# Patient Record
Sex: Female | Born: 1950
Health system: Southern US, Community
[De-identification: ages and names within clinical notes are randomized; demographics above are authoritative.]

## PROBLEM LIST (undated history)

## (undated) DIAGNOSIS — Z87442 Personal history of urinary calculi: Secondary | ICD-10-CM

## (undated) DIAGNOSIS — K219 Gastro-esophageal reflux disease without esophagitis: Secondary | ICD-10-CM

## (undated) DIAGNOSIS — G4730 Sleep apnea, unspecified: Secondary | ICD-10-CM

## (undated) DIAGNOSIS — I2510 Atherosclerotic heart disease of native coronary artery without angina pectoris: Secondary | ICD-10-CM

## (undated) DIAGNOSIS — Z972 Presence of dental prosthetic device (complete) (partial): Secondary | ICD-10-CM

## (undated) DIAGNOSIS — H269 Unspecified cataract: Secondary | ICD-10-CM

## (undated) DIAGNOSIS — T7840XA Allergy, unspecified, initial encounter: Secondary | ICD-10-CM

## (undated) DIAGNOSIS — I10 Essential (primary) hypertension: Secondary | ICD-10-CM

## (undated) DIAGNOSIS — F419 Anxiety disorder, unspecified: Secondary | ICD-10-CM

## (undated) DIAGNOSIS — I700 Atherosclerosis of aorta: Secondary | ICD-10-CM

## (undated) DIAGNOSIS — E039 Hypothyroidism, unspecified: Secondary | ICD-10-CM

## (undated) DIAGNOSIS — L409 Psoriasis, unspecified: Secondary | ICD-10-CM

## (undated) DIAGNOSIS — M1990 Unspecified osteoarthritis, unspecified site: Secondary | ICD-10-CM

## (undated) HISTORY — DX: Unspecified cataract: H26.9

## (undated) HISTORY — PX: EYE SURGERY: SHX253

## (undated) HISTORY — PX: CHOLECYSTECTOMY: SHX55

## (undated) HISTORY — DX: Anxiety disorder, unspecified: F41.9

## (undated) HISTORY — PX: CATARACT EXTRACTION: SUR2

## (undated) HISTORY — PX: TUBAL LIGATION: SHX77

## (undated) HISTORY — DX: Sleep apnea, unspecified: G47.30

## (undated) HISTORY — PX: BREAST SURGERY: SHX581

## (undated) HISTORY — DX: Unspecified osteoarthritis, unspecified site: M19.90

## (undated) HISTORY — DX: Hypothyroidism, unspecified: E03.9

## (undated) HISTORY — DX: Essential (primary) hypertension: I10

## (undated) HISTORY — DX: Psoriasis, unspecified: L40.9

## (undated) HISTORY — DX: Allergy, unspecified, initial encounter: T78.40XA

## (undated) HISTORY — DX: Gastro-esophageal reflux disease without esophagitis: K21.9

---

## 1974-09-18 HISTORY — PX: THYROIDECTOMY: SHX17

## 1979-09-19 HISTORY — PX: BREAST EXCISIONAL BIOPSY: SUR124

## 1997-09-18 HISTORY — PX: ABDOMINAL HYSTERECTOMY: SHX81

## 2001-01-16 LAB — HM COLONOSCOPY: HM Colonoscopy: NORMAL

## 2005-07-25 ENCOUNTER — Ambulatory Visit: Payer: Self-pay | Admitting: Internal Medicine

## 2006-09-04 ENCOUNTER — Ambulatory Visit: Payer: Self-pay | Admitting: Internal Medicine

## 2006-09-18 LAB — HM PAP SMEAR: HM Pap smear: NORMAL

## 2007-10-07 ENCOUNTER — Ambulatory Visit: Payer: Self-pay

## 2008-11-25 ENCOUNTER — Ambulatory Visit: Payer: Self-pay | Admitting: Family Medicine

## 2009-11-29 ENCOUNTER — Ambulatory Visit: Payer: Self-pay | Admitting: Family Medicine

## 2010-12-13 ENCOUNTER — Ambulatory Visit: Payer: Self-pay | Admitting: Family Medicine

## 2011-07-03 ENCOUNTER — Encounter: Payer: Self-pay | Admitting: Internal Medicine

## 2011-07-03 ENCOUNTER — Ambulatory Visit (INDEPENDENT_AMBULATORY_CARE_PROVIDER_SITE_OTHER): Payer: PRIVATE HEALTH INSURANCE | Admitting: Internal Medicine

## 2011-07-03 VITALS — BP 116/70 | HR 63 | Temp 98.5°F | Resp 14 | Ht 63.5 in | Wt 192.8 lb

## 2011-07-03 DIAGNOSIS — F329 Major depressive disorder, single episode, unspecified: Secondary | ICD-10-CM | POA: Insufficient documentation

## 2011-07-03 DIAGNOSIS — I10 Essential (primary) hypertension: Secondary | ICD-10-CM | POA: Insufficient documentation

## 2011-07-03 DIAGNOSIS — E039 Hypothyroidism, unspecified: Secondary | ICD-10-CM

## 2011-07-03 MED ORDER — SERTRALINE HCL 50 MG PO TABS: 50.0000 mg | ORAL_TABLET | Freq: Every day | ORAL | Status: DC

## 2011-07-03 NOTE — Patient Instructions (Signed)
[email or web address]  

## 2011-07-03 NOTE — Progress Notes (Signed)
Subjective:    Patient ID: Jennifer Patrick, female    DOB: 1951-06-17, 60 y.o.   MRN: [id number]  HPI Jennifer Patrick is a 60 year old female who presents to establish care. Notably, she is caring for her husband who is under hospice care for pancreatic cancer. She reports some recent increase in depression surrounding this. She reports good support from her friends and family members. She feels that her current dose of sertraline is adequate. She denies any physical complaints today. She reports full compliance with her medications.  Outpatient Encounter Prescriptions as of 07/03/2011  Medication Sig Dispense Refill  . atenolol (TENORMIN) 50 MG tablet Take 50 mg by mouth daily.        . calcipotriene-betamethasone (TACLONEX) ointment Apply 1 application topically daily.        Marland Kitchen etanercept (ENBREL) 50 MG/ML injection Inject 50 mg into the skin once a week.        . levothyroxine (SYNTHROID, LEVOTHROID) 100 MCG tablet Take 100 mcg by mouth daily.        Marland Kitchen omeprazole (PRILOSEC) 40 MG capsule Take 40 mg by mouth daily.        . sertraline (ZOLOFT) 50 MG tablet Take 1 tablet (50 mg total) by mouth daily.  90 tablet  4  . triamterene-hydrochlorothiazide (MAXZIDE-25) 37.5-25 MG per tablet Take 1 tablet by mouth daily.           Review of Systems  Constitutional: Negative for fever, chills, appetite change, fatigue and unexpected weight change.  HENT: Negative for ear pain, congestion, sore throat, trouble swallowing, neck pain, voice change and sinus pressure.   Eyes: Negative for visual disturbance.  Respiratory: Negative for cough, shortness of breath, wheezing and stridor.   Cardiovascular: Negative for chest pain, palpitations and leg swelling.  Gastrointestinal: Negative for nausea, vomiting, abdominal pain, diarrhea, constipation, blood in stool, abdominal distention and anal bleeding.  Genitourinary: Negative for dysuria and flank pain.  Musculoskeletal: Negative for myalgias,  arthralgias and gait problem.  Skin: Negative for color change and rash.  Neurological: Negative for dizziness and headaches.  Hematological: Negative for adenopathy. Does not bruise/bleed easily.  Psychiatric/Behavioral: Positive for dysphoric mood. Negative for suicidal ideas and sleep disturbance. The patient is not nervous/anxious.    BP 116/70  Pulse 63  Temp(Src) 98.5 F (36.9 C) (Oral)  Resp 14  Ht 5' 3.5" (1.613 m)  Wt 192 lb 12 oz (87.431 kg)  BMI 33.61 kg/m2  SpO2 98%     Objective:   Physical Exam  Constitutional: She is oriented to person, place, and time. She appears well-developed and well-nourished. No distress.  HENT:  Head: Normocephalic and atraumatic.  Right Ear: External ear normal.  Left Ear: External ear normal.  Nose: Nose normal.  Mouth/Throat: Oropharynx is clear and moist. No oropharyngeal exudate.  Eyes: Conjunctivae are normal. Pupils are equal, round, and reactive to light. Right eye exhibits no discharge. Left eye exhibits no discharge. No scleral icterus.  Neck: Normal range of motion. Neck supple. No tracheal deviation present. No thyromegaly present.  Cardiovascular: Normal rate, regular rhythm, normal heart sounds and intact distal pulses.  Exam reveals no gallop and no friction rub.   No murmur heard. Pulmonary/Chest: Effort normal and breath sounds normal. No respiratory distress. She has no wheezes. She has no rales. She exhibits no tenderness.  Musculoskeletal: Normal range of motion. She exhibits no edema and no tenderness.  Lymphadenopathy:    She has no cervical adenopathy.  Neurological: She is  alert and oriented to person, place, and time. No cranial nerve deficit. She exhibits normal muscle tone. Coordination normal.  Skin: Skin is warm and dry. No rash noted. She is not diaphoretic. No erythema. No pallor.  Psychiatric: Her behavior is normal. Judgment and thought content normal. Cognition and memory are normal. She exhibits a depressed  mood.          Assessment & Plan:  1. Depression/Bereavement -offered support today. We'll continue sertraline. She will call or return to clinic should she have any concerns.  2. Hypertension - blood pressures well-controlled today. We will check renal function with labs. She will return to clinic for her physical exam in March 2013.  3. Hypothyroidism -will check TSH with labs. We'll continue Synthroid. She will return to clinic in March of 2013.

## 2011-12-01 ENCOUNTER — Ambulatory Visit (INDEPENDENT_AMBULATORY_CARE_PROVIDER_SITE_OTHER): Payer: PRIVATE HEALTH INSURANCE | Admitting: Internal Medicine

## 2011-12-01 ENCOUNTER — Encounter: Payer: Self-pay | Admitting: Internal Medicine

## 2011-12-01 VITALS — BP 126/70 | HR 77 | Temp 98.1°F | Ht 63.0 in | Wt 196.0 lb

## 2011-12-01 DIAGNOSIS — F3289 Other specified depressive episodes: Secondary | ICD-10-CM

## 2011-12-01 DIAGNOSIS — Z1211 Encounter for screening for malignant neoplasm of colon: Secondary | ICD-10-CM

## 2011-12-01 DIAGNOSIS — F32A Depression, unspecified: Secondary | ICD-10-CM

## 2011-12-01 DIAGNOSIS — F329 Major depressive disorder, single episode, unspecified: Secondary | ICD-10-CM

## 2011-12-01 DIAGNOSIS — I10 Essential (primary) hypertension: Secondary | ICD-10-CM

## 2011-12-01 DIAGNOSIS — R229 Localized swelling, mass and lump, unspecified: Secondary | ICD-10-CM

## 2011-12-01 DIAGNOSIS — M7989 Other specified soft tissue disorders: Secondary | ICD-10-CM | POA: Insufficient documentation

## 2011-12-01 DIAGNOSIS — E039 Hypothyroidism, unspecified: Secondary | ICD-10-CM

## 2011-12-01 NOTE — Assessment & Plan Note (Addendum)
Blood pressure well-controlled today. Recent renal function normal. Will continue atenolol and triamterene hydrochlorothiazide. Followup in 6 months.

## 2011-12-01 NOTE — Assessment & Plan Note (Signed)
Recent thyroid function test on labs performed at her work are normal. We'll continue Synthroid. Followup in 6 months.

## 2011-12-01 NOTE — Assessment & Plan Note (Signed)
Symptoms well controlled with sertraline, especially given patient recently lost her husband. Offered support today. Encouraged her to followup with hospice counseling. Followup in 6 months year or sooner if needed.

## 2011-12-01 NOTE — Progress Notes (Signed)
Subjective:    Patient ID: Jennifer Patrick, female    DOB: 05-19-1951, 61 y.o.   MRN: [id number]  HPI 61 year old female with history of hypertension and hypothyroidism presents for followup. She notes that her husband passed away in 2011-09-15. She reports that she has been dealing relatively well with this loss. She notes she has strong support from friends and family. Her work has been very supportive. She continues to take sertraline and feels that this helps her with coping. She has not sought counseling through hospice.  In regards to her hypertension and hypothyroidism, she reports full compliance with her medications. She notes that she is trying to increase her physical activity and improve her diet. She denies any chest pain, shortness of breath, or headache.   She is also concerned today about several month history of a palpable nodule below her left ear. She reports that this has been unchanged and she first noticed it. It is not painful. She is not certain that it was there prior to several months ago. She denies any pain in her ear or change in hearing. She does not have fever or chills.  Outpatient Encounter Prescriptions as of 12/01/2011  Medication Sig Dispense Refill  . atenolol (TENORMIN) 50 MG tablet Take 50 mg by mouth daily.        . calcipotriene-betamethasone (TACLONEX) ointment Apply 1 application topically daily.        Marland Kitchen etanercept (ENBREL) 50 MG/ML injection Inject 50 mg into the skin once a week.        . levothyroxine (SYNTHROID, LEVOTHROID) 100 MCG tablet Take 100 mcg by mouth daily.        Marland Kitchen omeprazole (PRILOSEC) 40 MG capsule Take 40 mg by mouth daily.        . sertraline (ZOLOFT) 50 MG tablet Take 1 tablet (50 mg total) by mouth daily.  90 tablet  4  . triamterene-hydrochlorothiazide (MAXZIDE-25) 37.5-25 MG per tablet Take 1 tablet by mouth daily.         BP 126/70  Pulse 77  Temp(Src) 98.1 F (36.7 C) (Oral)  Ht 5\' 3"  (1.6 m)  Wt 196 lb (88.905 kg)   BMI 34.72 kg/m2  SpO2 98%  Review of Systems  Constitutional: Negative for fever, chills, appetite change, fatigue and unexpected weight change.  HENT: Negative for ear pain, congestion, sore throat, trouble swallowing, neck pain, voice change and sinus pressure.   Eyes: Negative for visual disturbance.  Respiratory: Negative for cough, shortness of breath, wheezing and stridor.   Cardiovascular: Negative for chest pain, palpitations and leg swelling.  Gastrointestinal: Negative for nausea, vomiting, abdominal pain, diarrhea, constipation, blood in stool, abdominal distention and anal bleeding.  Genitourinary: Negative for dysuria and flank pain.  Musculoskeletal: Negative for myalgias, arthralgias and gait problem.  Skin: Negative for color change and rash.  Neurological: Negative for dizziness and headaches.  Hematological: Negative for adenopathy. Does not bruise/bleed easily.  Psychiatric/Behavioral: Positive for dysphoric mood. Negative for suicidal ideas and sleep disturbance. The patient is not nervous/anxious.        Objective:   Physical Exam  Constitutional: She is oriented to person, place, and time. She appears well-developed and well-nourished. No distress.  HENT:  Head: Normocephalic and atraumatic.    Right Ear: External ear normal.  Left Ear: External ear normal.  Nose: Nose normal.  Mouth/Throat: Oropharynx is clear and moist. No oropharyngeal exudate.  Eyes: Conjunctivae are normal. Pupils are equal, round, and reactive to light. Right  eye exhibits no discharge. Left eye exhibits no discharge. No scleral icterus.  Neck: Normal range of motion. Neck supple. No tracheal deviation present. No thyromegaly present.  Cardiovascular: Normal rate, regular rhythm, normal heart sounds and intact distal pulses.  Exam reveals no gallop and no friction rub.   No murmur heard. Pulmonary/Chest: Effort normal and breath sounds normal. No respiratory distress. She has no wheezes.  She has no rales. She exhibits no tenderness.  Abdominal: Soft. Bowel sounds are normal. She exhibits no distension and no mass. There is no tenderness. There is no rebound and no guarding.  Musculoskeletal: Normal range of motion. She exhibits no edema and no tenderness.  Lymphadenopathy:    She has no cervical adenopathy.  Neurological: She is alert and oriented to person, place, and time. No cranial nerve deficit. She exhibits normal muscle tone. Coordination normal.  Skin: Skin is warm and dry. No rash noted. She is not diaphoretic. No erythema. No pallor.  Psychiatric: She has a normal mood and affect. Her behavior is normal. Judgment and thought content normal.          Assessment & Plan:

## 2011-12-01 NOTE — Assessment & Plan Note (Signed)
Small nodule noted inferior to left ear. It is difficult to tell whether this is part of her mandible. Given that it has not changed and has been present for several months to one year we'll continue to monitor. We discussed potential referral to ENT for further evaluation, but she would prefer to monitor for now.

## 2011-12-28 ENCOUNTER — Ambulatory Visit: Payer: Self-pay | Admitting: Family Medicine

## 2012-02-28 ENCOUNTER — Telehealth: Payer: Self-pay | Admitting: *Deleted

## 2012-02-28 MED ORDER — LEVOTHYROXINE SODIUM 100 MCG PO TABS: 100.0000 ug | ORAL_TABLET | Freq: Every day | ORAL | Status: DC

## 2012-02-28 NOTE — Telephone Encounter (Signed)
She can change to Synthroid daily. Generic is fine. However she will need to have TSH rechecked 1 month after change is made.

## 2012-02-28 NOTE — Telephone Encounter (Signed)
Received a fax from pharmacy stating that Levoxyl has been discontinued.  They want you to switch patient to brand name Synthroid or the generic.  Please advise.

## 2012-02-28 NOTE — Telephone Encounter (Signed)
Rx sent to [facility] pharmacy, patient advised as instructed via telephone.  She stated that she will have the nurse practitioner at her job do her TSH and fax the results to [place] in one month.

## 2012-03-29 ENCOUNTER — Other Ambulatory Visit: Payer: Self-pay | Admitting: *Deleted

## 2012-03-29 MED ORDER — LEVOTHYROXINE SODIUM 100 MCG PO TABS: 100.0000 ug | ORAL_TABLET | Freq: Every day | ORAL | Status: DC

## 2012-06-04 ENCOUNTER — Ambulatory Visit (INDEPENDENT_AMBULATORY_CARE_PROVIDER_SITE_OTHER): Payer: PRIVATE HEALTH INSURANCE | Admitting: Internal Medicine

## 2012-06-04 ENCOUNTER — Encounter: Payer: Self-pay | Admitting: Internal Medicine

## 2012-06-04 VITALS — BP 114/62 | HR 72 | Temp 98.4°F | Resp 16 | Wt 199.5 lb

## 2012-06-04 DIAGNOSIS — R229 Localized swelling, mass and lump, unspecified: Secondary | ICD-10-CM

## 2012-06-04 DIAGNOSIS — F329 Major depressive disorder, single episode, unspecified: Secondary | ICD-10-CM

## 2012-06-04 DIAGNOSIS — F32A Depression, unspecified: Secondary | ICD-10-CM

## 2012-06-04 DIAGNOSIS — H5461 Unqualified visual loss, right eye, normal vision left eye: Secondary | ICD-10-CM | POA: Insufficient documentation

## 2012-06-04 DIAGNOSIS — F3289 Other specified depressive episodes: Secondary | ICD-10-CM

## 2012-06-04 DIAGNOSIS — M7989 Other specified soft tissue disorders: Secondary | ICD-10-CM

## 2012-06-04 DIAGNOSIS — H5460 Unqualified visual loss, one eye, unspecified: Secondary | ICD-10-CM

## 2012-06-04 DIAGNOSIS — I10 Essential (primary) hypertension: Secondary | ICD-10-CM

## 2012-06-04 DIAGNOSIS — E039 Hypothyroidism, unspecified: Secondary | ICD-10-CM

## 2012-06-04 NOTE — Assessment & Plan Note (Signed)
Recent thyroid testing normal. Will continue Synthroid 100 mcg daily. Followup 6 months.

## 2012-06-04 NOTE — Assessment & Plan Note (Signed)
Nodule of soft tissue posterior to left mandible continues to be stable in size. Again discussed potential referral to ENT for biopsy but patient would like to continue to monitor for now. Follow up 6 months.

## 2012-06-04 NOTE — Assessment & Plan Note (Signed)
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 Will request notes on this.

## 2012-06-04 NOTE — Assessment & Plan Note (Signed)
Blood pressure well-controlled on current medications. Will have patient have renal function check with employer labs. Followup 6 months.

## 2012-06-04 NOTE — Progress Notes (Signed)
Subjective:    Patient ID: Jennifer Patrick, female    DOB: Jan 29, 1951, 61 y.o.   MRN: [id number]  HPI 61 year old female with history of hypertension, hypothyroidism, and depression presents for followup. She reports she is generally been doing well. In regards to depression, she continues to decrease her loss of her husband last year. However, she has been active and spending time with friends and family members and feels that she is doing well. She continues to work full-time. She reports full compliance with all her medications. She denies any recent chest pain, headache, or other concerns.  She notes that over the last few months she has had some blurred vision in her right eye. 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 She is scheduled to see a retinal specialist.  Outpatient Encounter Prescriptions as of 06/04/2012  Medication Sig Dispense Refill  . atenolol (TENORMIN) 50 MG tablet Take 50 mg by mouth daily.        . calcipotriene-betamethasone (TACLONEX) ointment Apply 1 application topically daily.        Marland Kitchen etanercept (ENBREL) 50 MG/ML injection Inject 50 mg into the skin once a week.        . levothyroxine (SYNTHROID, LEVOTHROID) 100 MCG tablet Take 1 tablet (100 mcg total) by mouth daily.  90 tablet  0  . omeprazole (PRILOSEC) 40 MG capsule Take 40 mg by mouth daily.        . sertraline (ZOLOFT) 50 MG tablet Take 1 tablet (50 mg total) by mouth daily.  90 tablet  4  . triamterene-hydrochlorothiazide (MAXZIDE-25) 37.5-25 MG per tablet Take 1 tablet by mouth daily.         BP 114/62  Pulse 72  Temp 98.4 F (36.9 C) (Oral)  Resp 16  Wt 199 lb 8 oz (90.493 kg)  SpO2 96%  Review of Systems  Constitutional: Negative for fever, chills, appetite change, fatigue and unexpected weight change.  HENT: Negative for ear pain, congestion, sore throat, trouble swallowing, neck pain, voice change and sinus pressure.   Eyes:  Positive for visual disturbance.  Respiratory: Negative for cough, shortness of breath, wheezing and stridor.   Cardiovascular: Negative for chest pain, palpitations and leg swelling.  Gastrointestinal: Negative for nausea, vomiting, abdominal pain, diarrhea, constipation, blood in stool, abdominal distention and anal bleeding.  Genitourinary: Negative for dysuria and flank pain.  Musculoskeletal: Negative for myalgias, arthralgias and gait problem.  Skin: Negative for color change and rash.  Neurological: Negative for dizziness and headaches.  Hematological: Negative for adenopathy. Does not bruise/bleed easily.  Psychiatric/Behavioral: Negative for suicidal ideas, disturbed wake/sleep cycle and dysphoric mood. The patient is not nervous/anxious.        Objective:   Physical Exam  Constitutional: She is oriented to person, place, and time. She appears well-developed and well-nourished. No distress.  HENT:  Head: Normocephalic and atraumatic.  Right Ear: External ear normal.  Left Ear: External ear normal.  Nose: Nose normal.  Mouth/Throat: Oropharynx is clear and moist. No oropharyngeal exudate.  Eyes: Conjunctivae normal are normal. Pupils are equal, round, and reactive to light. Right eye exhibits no discharge. Left eye exhibits no discharge. No scleral icterus.  Neck: Normal range of motion. Neck supple. No tracheal deviation present. No thyromegaly present.  Cardiovascular: Normal rate, regular rhythm, normal heart sounds and intact distal pulses.  Exam reveals no gallop and no friction rub.   No murmur heard. Pulmonary/Chest: Effort normal and  breath sounds normal. No respiratory distress. She has no wheezes. She has no rales. She exhibits no tenderness.  Musculoskeletal: Normal range of motion. She exhibits no edema and no tenderness.  Lymphadenopathy:    She has no cervical adenopathy.  Neurological: She is alert and oriented to person, place, and time. No cranial nerve deficit.  She exhibits normal muscle tone. Coordination normal.  Skin: Skin is warm and dry. No rash noted. She is not diaphoretic. No erythema. No pallor.  Psychiatric: She has a normal mood and affect. Her behavior is normal. Judgment and thought content normal.          Assessment & Plan:

## 2012-06-04 NOTE — Assessment & Plan Note (Signed)
Patient continues to do well and symptoms are well controlled with Zoloft. Will continue. Follow up 6 months.

## 2012-07-09 ENCOUNTER — Other Ambulatory Visit: Payer: Self-pay | Admitting: Internal Medicine

## 2012-08-07 ENCOUNTER — Other Ambulatory Visit: Payer: Self-pay | Admitting: Internal Medicine

## 2012-09-18 HISTORY — PX: VITRECTOMY: SHX106

## 2012-11-02 ENCOUNTER — Other Ambulatory Visit: Payer: Self-pay

## 2012-11-27 ENCOUNTER — Telehealth: Payer: Self-pay | Admitting: Internal Medicine

## 2012-11-27 NOTE — Telephone Encounter (Signed)
Labs received from [facility] 11/25/2012 show normal kidney and liver function. Cholesterol is slightly elevated. Thyroid function is slightly low. Vitamin D is slightly low. Blood sugars are slightly elevated. We can discuss all of this at your physical exam later this month.

## 2012-12-05 ENCOUNTER — Ambulatory Visit (INDEPENDENT_AMBULATORY_CARE_PROVIDER_SITE_OTHER): Payer: BC Managed Care – PPO | Admitting: Internal Medicine

## 2012-12-05 ENCOUNTER — Encounter: Payer: Self-pay | Admitting: Internal Medicine

## 2012-12-05 VITALS — BP 110/70 | HR 67 | Temp 98.4°F | Ht 63.0 in | Wt 201.0 lb

## 2012-12-05 DIAGNOSIS — Z0000 Encounter for general adult medical examination without abnormal findings: Secondary | ICD-10-CM

## 2012-12-05 DIAGNOSIS — L409 Psoriasis, unspecified: Secondary | ICD-10-CM

## 2012-12-05 DIAGNOSIS — E039 Hypothyroidism, unspecified: Secondary | ICD-10-CM

## 2012-12-05 DIAGNOSIS — I10 Essential (primary) hypertension: Secondary | ICD-10-CM

## 2012-12-05 DIAGNOSIS — F411 Generalized anxiety disorder: Secondary | ICD-10-CM | POA: Insufficient documentation

## 2012-12-05 LAB — HM MAMMOGRAPHY

## 2012-12-05 MED ORDER — LEVOTHYROXINE SODIUM 125 MCG PO TABS: 125.0000 ug | ORAL_TABLET | Freq: Every day | ORAL | Status: DC

## 2012-12-05 NOTE — Progress Notes (Signed)
Subjective:    Patient ID: Jennifer Patrick, female    DOB: [birth year], 62 y.o.   MRN: [id number]  HPI 62 year old female with history of hypertension, hypothyroidism and psoriasis presents for annual exam. She reports recent increase in anxiety and stress at work. She is trying to retire in work a limited schedule prior to retirement. She reports recent episode of panic attack in which she felt overwhelmed and was irrational. She continues to take Zoloft.  In regards to her hypothyroidism, she recently had labs performed which showed TSH was elevated. She notes some fatigue but denies any hot or cold intolerance or constipation.   Regards to psoriasis, she reports symptoms have recently worsened. Particularly over her legs and right upper thigh. She reports no improvement with use of Enbrel. She would like to get a second opinion on her care from another dermatologist.  Outpatient Encounter Prescriptions as of 12/05/2012  Medication Sig Dispense Refill  . atenolol (TENORMIN) 50 MG tablet Take 50 mg by mouth daily.        . calcipotriene-betamethasone (TACLONEX) ointment Apply 1 application topically daily.        . cholecalciferol (VITAMIN D) 1000 UNITS tablet Take 1,000 Units by mouth daily.      Marland Kitchen desoximetasone (TOPICORT) 0.05 % cream Apply 1 application topically 2 (two) times daily.      Marland Kitchen etanercept (ENBREL) 50 MG/ML injection Inject 50 mg into the skin once a week.        . Multiple Vitamins-Minerals (VISION VITAMINS PO) Take by mouth.      Marland Kitchen omeprazole (PRILOSEC) 40 MG capsule Take 40 mg by mouth daily.        . sertraline (ZOLOFT) 50 MG tablet TAKE 1 TABLET EVERY DAY  90 tablet  3  . triamterene-hydrochlorothiazide (MAXZIDE-25) 37.5-25 MG per tablet Take 1 tablet by mouth daily.        . [DISCONTINUED] levothyroxine (SYNTHROID, LEVOTHROID) 100 MCG tablet TAKE 1 TABLET BY MOUTH DAILY.  90 tablet  1  . levothyroxine (SYNTHROID, LEVOTHROID) 125 MCG tablet Take 1 tablet (125 mcg total)  by mouth daily.  90 tablet  3   No facility-administered encounter medications on file as of 12/05/2012.   BP 110/70  Pulse 67  Temp(Src) 98.4 F (36.9 C) (Oral)  Ht 5\' 3"  (1.6 m)  Wt 201 lb (91.173 kg)  BMI 35.61 kg/m2  SpO2 96%  Review of Systems  Constitutional: Negative for fever, chills, appetite change, fatigue and unexpected weight change.  HENT: Negative for ear pain, congestion, sore throat, trouble swallowing, neck pain, voice change and sinus pressure.   Eyes: Negative for visual disturbance.  Respiratory: Negative for cough, shortness of breath, wheezing and stridor.   Cardiovascular: Negative for chest pain, palpitations and leg swelling.  Gastrointestinal: Negative for nausea, vomiting, abdominal pain, diarrhea, constipation, blood in stool, abdominal distention and anal bleeding.  Genitourinary: Negative for dysuria and flank pain.  Musculoskeletal: Negative for myalgias, arthralgias and gait problem.  Skin: Positive for rash. Negative for color change.  Neurological: Negative for dizziness and headaches.  Hematological: Negative for adenopathy. Does not bruise/bleed easily.  Psychiatric/Behavioral: Negative for suicidal ideas, sleep disturbance and dysphoric mood. The patient is nervous/anxious.        Objective:   Physical Exam  Constitutional: She is oriented to person, place, and time. She appears well-developed and well-nourished. No distress.  HENT:  Head: Normocephalic and atraumatic.  Right Ear: External ear normal.  Left Ear: External ear normal.  Nose: Nose normal.  Mouth/Throat: Oropharynx is clear and moist. No oropharyngeal exudate.  Eyes: Conjunctivae are normal. Pupils are equal, round, and reactive to light. Right eye exhibits no discharge. Left eye exhibits no discharge. No scleral icterus.  Neck: Normal range of motion. Neck supple. No tracheal deviation present. No thyromegaly present.  Cardiovascular: Normal rate, regular rhythm, normal heart  sounds and intact distal pulses.  Exam reveals no gallop and no friction rub.   No murmur heard. Pulmonary/Chest: Effort normal and breath sounds normal. No accessory muscle usage. Not tachypneic. No respiratory distress. She has no decreased breath sounds. She has no wheezes. She has no rhonchi. She has no rales. She exhibits no tenderness. Right breast exhibits no inverted nipple, no mass, no nipple discharge, no skin change and no tenderness. Left breast exhibits no inverted nipple, no mass, no nipple discharge, no skin change and no tenderness. Breasts are symmetrical.  Abdominal: Soft. Bowel sounds are normal. She exhibits no distension and no mass. There is no tenderness. There is no rebound and no guarding.  Musculoskeletal: Normal range of motion. She exhibits no edema and no tenderness.  Lymphadenopathy:    She has no cervical adenopathy.  Neurological: She is alert and oriented to person, place, and time. No cranial nerve deficit. She exhibits normal muscle tone. Coordination normal.  Skin: Skin is warm and dry. Rash (plaques of psoriasis over trunk and extremities) noted. Rash is papular and pustular. She is not diaphoretic. No erythema. No pallor.  Psychiatric: She has a normal mood and affect. Her behavior is normal. Judgment and thought content normal.          Assessment & Plan:

## 2012-12-05 NOTE — Assessment & Plan Note (Signed)
Exam normal today including breast exam. Pap and pelvic deferred per pt preference and given s/p hysterectomy. Reviewed recent labs. Increased levothyroxine as above. Follow up 4-6 weeks for repeat TSH and T4. Encouraged healthy diet and regular physical activity. Encouraged her to take time for herself.

## 2012-12-05 NOTE — Assessment & Plan Note (Signed)
Symptoms recently worsening with diffuse plaques over trunk and extremities. No change noted with Embrel. Will set up dermatology evaluation at [facility].

## 2012-12-05 NOTE — Assessment & Plan Note (Signed)
Recent episodes of panic attacks. Likely related to increased stressors at work. Discussed using medication to help with this. Pt would prefer to use coping strategies and limit stressors at work. For now, we'll continue Zoloft. Followup as needed.

## 2012-12-05 NOTE — Assessment & Plan Note (Signed)
Recent TSH elevated on outside labs. Will increase Levothyroxine to daily. Repeat TSH in 4-6 weeks.

## 2012-12-05 NOTE — Assessment & Plan Note (Signed)
BP Readings from Last 3 Encounters:  12/05/12 110/70  06/04/12 114/62  12/01/11 126/70   BP well controlled on Maxide. Will continue. Renal function normal on outside labs 11/2012.

## 2012-12-12 LAB — HM MAMMOGRAPHY: HM MAMMO: NORMAL

## 2012-12-31 ENCOUNTER — Ambulatory Visit: Payer: Self-pay | Admitting: Internal Medicine

## 2013-01-23 ENCOUNTER — Encounter: Payer: Self-pay | Admitting: Internal Medicine

## 2013-03-24 ENCOUNTER — Telehealth: Payer: Self-pay | Admitting: Internal Medicine

## 2013-03-24 ENCOUNTER — Ambulatory Visit: Payer: Self-pay | Admitting: Ophthalmology

## 2013-03-24 LAB — POTASSIUM: Potassium: 3.4 mmol/L — ABNORMAL LOW (ref 3.5–5.1)

## 2013-03-24 NOTE — Telephone Encounter (Signed)
Copy of EKG needed @ [facility] Attn: [place] @  [phone]

## 2013-03-24 NOTE — Telephone Encounter (Signed)
Faxed

## 2013-04-02 ENCOUNTER — Ambulatory Visit: Payer: Self-pay | Admitting: Ophthalmology

## 2013-06-06 ENCOUNTER — Encounter: Payer: Self-pay | Admitting: Internal Medicine

## 2013-06-06 ENCOUNTER — Ambulatory Visit (INDEPENDENT_AMBULATORY_CARE_PROVIDER_SITE_OTHER): Payer: BC Managed Care – PPO | Admitting: Internal Medicine

## 2013-06-06 VITALS — BP 130/78 | HR 85 | Temp 97.9°F | Wt 206.0 lb

## 2013-06-06 DIAGNOSIS — E039 Hypothyroidism, unspecified: Secondary | ICD-10-CM

## 2013-06-06 DIAGNOSIS — E669 Obesity, unspecified: Secondary | ICD-10-CM | POA: Insufficient documentation

## 2013-06-06 DIAGNOSIS — I10 Essential (primary) hypertension: Secondary | ICD-10-CM

## 2013-06-06 DIAGNOSIS — L408 Other psoriasis: Secondary | ICD-10-CM

## 2013-06-06 DIAGNOSIS — F329 Major depressive disorder, single episode, unspecified: Secondary | ICD-10-CM

## 2013-06-06 DIAGNOSIS — L409 Psoriasis, unspecified: Secondary | ICD-10-CM

## 2013-06-06 NOTE — Assessment & Plan Note (Signed)
Symptoms recently well controlled on sertraline. Will continue. 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

## 2013-06-06 NOTE — Assessment & Plan Note (Signed)
BP Readings from Last 3 Encounters:  06/06/13 130/78  12/05/12 110/70  06/04/12 114/62   BP well controlled on current medication. Will request recent labs from [facility]. Follow up 6 months and prn.

## 2013-06-06 NOTE — Assessment & Plan Note (Signed)
Encouraged healthy diet, high in fiber and low in saturated fat and processed carbohydrates. Encouraged regular physical activity with goal of 3x per week.

## 2013-06-06 NOTE — Assessment & Plan Note (Signed)
Will request recent labs including TSH testing. Continue Levothyroxine.

## 2013-06-06 NOTE — Progress Notes (Signed)
Subjective:    Patient ID: Jennifer Patrick, female    DOB: 07-Apr-1951, 62 y.o.   MRN: [id number]  HPI 62YO female with h/o hypertension, hypothyroidism, psoriasis presents for follow up. Generally doing well. Plans to retire 09/17/2013. Reports symptoms of anxiety and depression have been well controlled. Notes she recently had right eye surgery (vitrectomy) which improved vision. No new concerns today. Notes that labs were performed through her work last month.   Outpatient Encounter Prescriptions as of 06/06/2013  Medication Sig Dispense Refill  . atenolol (TENORMIN) 50 MG tablet Take 50 mg by mouth daily.        . calcipotriene-betamethasone (TACLONEX) ointment Apply 1 application topically daily.        . cholecalciferol (VITAMIN D) 1000 UNITS tablet Take 1,000 Units by mouth daily.      Marland Kitchen desoximetasone (TOPICORT) 0.05 % cream Apply 1 application topically 2 (two) times daily.      Marland Kitchen etanercept (ENBREL) 50 MG/ML injection Inject 50 mg into the skin once a week.        . levothyroxine (SYNTHROID, LEVOTHROID) 125 MCG tablet Take 1 tablet (125 mcg total) by mouth daily.  90 tablet  3  . Multiple Vitamins-Minerals (VISION VITAMINS PO) Take by mouth.      Marland Kitchen omeprazole (PRILOSEC) 40 MG capsule Take 40 mg by mouth daily.        . sertraline (ZOLOFT) 50 MG tablet TAKE 1 TABLET EVERY DAY  90 tablet  3  . triamterene-hydrochlorothiazide (MAXZIDE-25) 37.5-25 MG per tablet Take 1 tablet by mouth daily.         No facility-administered encounter medications on file as of 06/06/2013.   BP 130/78  Pulse 85  Temp(Src) 97.9 F (36.6 C) (Oral)  Wt 206 lb (93.441 kg)  BMI 36.5 kg/m2  SpO2 97%  Review of Systems  Constitutional: Negative for fever, chills, appetite change, fatigue and unexpected weight change.  HENT: Negative for ear pain, congestion, sore throat, trouble swallowing, neck pain, voice change and sinus pressure.   Eyes: Negative for visual disturbance.  Respiratory: Negative for  cough, shortness of breath, wheezing and stridor.   Cardiovascular: Negative for chest pain, palpitations and leg swelling.  Gastrointestinal: Negative for nausea, vomiting, abdominal pain, diarrhea, constipation, blood in stool, abdominal distention and anal bleeding.  Genitourinary: Negative for dysuria and flank pain.  Musculoskeletal: Negative for myalgias, arthralgias and gait problem.  Skin: Negative for color change and rash.  Neurological: Negative for dizziness and headaches.  Hematological: Negative for adenopathy. Does not bruise/bleed easily.  Psychiatric/Behavioral: Negative for suicidal ideas, sleep disturbance and dysphoric mood. The patient is not nervous/anxious.        Objective:   Physical Exam  Constitutional: She is oriented to person, place, and time. She appears well-developed and well-nourished. No distress.  HENT:  Head: Normocephalic and atraumatic.  Right Ear: External ear normal.  Left Ear: External ear normal.  Nose: Nose normal.  Mouth/Throat: Oropharynx is clear and moist. No oropharyngeal exudate.  Eyes: Conjunctivae are normal. Pupils are equal, round, and reactive to light. Right eye exhibits no discharge. Left eye exhibits no discharge. No scleral icterus.  Neck: Normal range of motion. Neck supple. No tracheal deviation present. No thyromegaly present.  Cardiovascular: Normal rate, regular rhythm, normal heart sounds and intact distal pulses.  Exam reveals no gallop and no friction rub.   No murmur heard. Pulmonary/Chest: Effort normal and breath sounds normal. No accessory muscle usage. Not tachypneic. No respiratory distress. She  has no decreased breath sounds. She has no wheezes. She has no rhonchi. She has no rales. She exhibits no tenderness.  Abdominal: Soft. Bowel sounds are normal. She exhibits no distension. There is no tenderness. There is no rebound and no guarding.  Musculoskeletal: Normal range of motion. She exhibits no edema and no  tenderness.  Lymphadenopathy:    She has no cervical adenopathy.  Neurological: She is alert and oriented to person, place, and time. No cranial nerve deficit. She exhibits normal muscle tone. Coordination normal.  Skin: Skin is warm and dry. No rash noted. She is not diaphoretic. No erythema. No pallor.  Psychiatric: She has a normal mood and affect. Her behavior is normal. Judgment and thought content normal.          Assessment & Plan:

## 2013-06-06 NOTE — Assessment & Plan Note (Signed)
Symptoms well controlled with Embrel. Will continue.

## 2013-07-24 ENCOUNTER — Other Ambulatory Visit: Payer: Self-pay

## 2013-07-29 ENCOUNTER — Other Ambulatory Visit: Payer: Self-pay | Admitting: Internal Medicine

## 2013-07-29 NOTE — Telephone Encounter (Signed)
Eprescribed.

## 2013-11-27 ENCOUNTER — Encounter: Payer: Self-pay | Admitting: *Deleted

## 2013-12-12 ENCOUNTER — Encounter: Payer: Self-pay | Admitting: Internal Medicine

## 2013-12-12 ENCOUNTER — Ambulatory Visit (INDEPENDENT_AMBULATORY_CARE_PROVIDER_SITE_OTHER): Payer: BC Managed Care – PPO | Admitting: Internal Medicine

## 2013-12-12 VITALS — BP 104/62 | HR 76 | Temp 98.2°F | Resp 16 | Ht 63.5 in | Wt 201.0 lb

## 2013-12-12 DIAGNOSIS — L409 Psoriasis, unspecified: Secondary | ICD-10-CM

## 2013-12-12 DIAGNOSIS — Z1239 Encounter for other screening for malignant neoplasm of breast: Secondary | ICD-10-CM

## 2013-12-12 DIAGNOSIS — Z23 Encounter for immunization: Secondary | ICD-10-CM

## 2013-12-12 DIAGNOSIS — E669 Obesity, unspecified: Secondary | ICD-10-CM

## 2013-12-12 DIAGNOSIS — Z1211 Encounter for screening for malignant neoplasm of colon: Secondary | ICD-10-CM

## 2013-12-12 DIAGNOSIS — I10 Essential (primary) hypertension: Secondary | ICD-10-CM

## 2013-12-12 DIAGNOSIS — Z0000 Encounter for general adult medical examination without abnormal findings: Secondary | ICD-10-CM

## 2013-12-12 DIAGNOSIS — L408 Other psoriasis: Secondary | ICD-10-CM

## 2013-12-12 LAB — MICROALBUMIN / CREATININE URINE RATIO
Creatinine,U: 284.6 mg/dL
MICROALB/CREAT RATIO: 0.2 mg/g (ref 0.0–30.0)
Microalb, Ur: 0.6 mg/dL (ref 0.0–1.9)

## 2013-12-12 LAB — LIPID PANEL
CHOLESTEROL: 221 mg/dL — AB (ref 0–200)
HDL: 40.5 mg/dL (ref >39.00)
LDL Cholesterol: 157 mg/dL — ABNORMAL HIGH (ref 0–99)
Total CHOL/HDL Ratio: 5
Triglycerides: 119 mg/dL (ref 0.0–149.0)
VLDL: 23.8 mg/dL (ref 0.0–40.0)

## 2013-12-12 LAB — CBC WITH DIFFERENTIAL/PLATELET
BASOS PCT: 0.6 % (ref 0.0–3.0)
Basophils Absolute: 0 10*3/uL (ref 0.0–0.1)
Eosinophils Absolute: 0.3 10*3/uL (ref 0.0–0.7)
Eosinophils Relative: 4.7 % (ref 0.0–5.0)
HCT: 39.2 % (ref 36.0–46.0)
HEMOGLOBIN: 13.1 g/dL (ref 12.0–15.0)
LYMPHS ABS: 2.1 10*3/uL (ref 0.7–4.0)
Lymphocytes Relative: 31.6 % (ref 12.0–46.0)
MCHC: 33.4 g/dL (ref 30.0–36.0)
MCV: 86.3 fl (ref 78.0–100.0)
MONOS PCT: 8.5 % (ref 3.0–12.0)
Monocytes Absolute: 0.6 10*3/uL (ref 0.1–1.0)
NEUTROS ABS: 3.6 10*3/uL (ref 1.4–7.7)
Neutrophils Relative %: 54.6 % (ref 43.0–77.0)
Platelets: 206 10*3/uL (ref 150.0–400.0)
RBC: 4.54 Mil/uL (ref 3.87–5.11)
RDW: 14.5 % (ref 11.5–14.6)
WBC: 6.6 10*3/uL (ref 4.5–10.5)

## 2013-12-12 LAB — COMPREHENSIVE METABOLIC PANEL
ALT: 23 U/L (ref 0–35)
AST: 27 U/L (ref 0–37)
Albumin: 3.8 g/dL (ref 3.5–5.2)
Alkaline Phosphatase: 81 U/L (ref 39–117)
BILIRUBIN TOTAL: 0.6 mg/dL (ref 0.3–1.2)
BUN: 13 mg/dL (ref 6–23)
CALCIUM: 9 mg/dL (ref 8.4–10.5)
CO2: 26 mEq/L (ref 19–32)
Chloride: 104 mEq/L (ref 96–112)
Creatinine, Ser: 1 mg/dL (ref 0.4–1.2)
GFR: 61.99 mL/min (ref >60.00)
Glucose, Bld: 94 mg/dL (ref 70–99)
Potassium: 3.8 mEq/L (ref 3.5–5.1)
Sodium: 139 mEq/L (ref 135–145)
Total Protein: 6.5 g/dL (ref 6.0–8.3)

## 2013-12-12 NOTE — Patient Instructions (Signed)
Look into coverage for Cologuard.

## 2013-12-12 NOTE — Assessment & Plan Note (Signed)
Symptoms have recently worsened. Follow up with dermatology to discuss other options pending.

## 2013-12-12 NOTE — Assessment & Plan Note (Signed)
BP Readings from Last 3 Encounters:  12/12/13 104/62  06/06/13 130/78  12/05/12 110/70   BP well controlled on current medications. Will continue.

## 2013-12-12 NOTE — Assessment & Plan Note (Signed)
General medical exam including breast exam normal. PAP and pelvic deferred per pt preference and h/o hysterectomy. Mammogram ordered. Colonoscopy referral placed. Labs today including CBC, CMP, lipids. EKG normal today. Encouraged healthy diet and regular exercise. Follow up 6 months and prn.

## 2013-12-12 NOTE — Progress Notes (Signed)
Pre visit review using our clinic review tool, if applicable. No additional management support is needed unless otherwise documented below in the visit note. 

## 2013-12-12 NOTE — Assessment & Plan Note (Signed)
Wt Readings from Last 3 Encounters:  12/12/13 201 lb (91.173 kg)  06/06/13 206 lb (93.441 kg)  12/05/12 201 lb (91.173 kg)   Encouraged healthy diet and regular exercise with goal of weight loss.

## 2013-12-12 NOTE — Progress Notes (Signed)
The patient is here for annual Medicare Wellness Examination and management of other chronic and acute problems.   The risk factors are reflected in the history.  The roster of all physicians providing medical care to patient - is listed in the Snapshot section of the chart.  Activities of daily living:   The patient is 100% independent in all ADLs: dressing, toileting, feeding as well as independent mobility. Patient lives alone. Has 2 cats. Two story home with carpet and hard floors.  Home safety :  The patient has smoke detectors in the home.  They wear seatbelts in their car. There are no firearms at home.  There is no violence in the home. They feel safe where they live.  Infectious Risks: There is no risks for hepatitis, STDs or HIV.  There is no  history of blood transfusion.  They have no travel history to infectious disease endemic areas of the world.  Additional Health Care Providers: The patient has seen their dentist in the last six months. Dentist - Dr. Eugenie Birks They have seen their eye doctor in the last year. Opthalmologist - [facility], Dr. Linton Flemings and Appenzeller Scheduled for cataract surgery on Monday.  They deny hearing issues. They have deferred audiologic testing in the last year.   They do not  have excessive sun exposure. Discussed the need for sun protection: hats,long sleeves and use of sunscreen if there is significant sun exposure.  Dermatologist - Dr. Nehemiah Massed Psoriasis has recently flared, scheduled for follow up next week. On Enbrel. Recently increased injection.  Diet: the importance of a healthy diet is discussed. They do have a healthy diet.  The benefits of regular aerobic exercise were discussed. Patient exercises occasional walking 3 days per week 30-45.  Depression screen: there are no signs or vegative symptoms of depression- irritability, change in appetite, anhedonia, sadness/tearfullness.  Cognitive assessment: the patient  manages all their financial and personal affairs and is actively engaged. They could relate day,date,year and events.  HCPOA - none in place, plans to discuss with daughter  The following portions of the patient's history were reviewed and updated as appropriate: allergies, current medications, past family history, past medical history,  past surgical history, past social history and problem list.  Visual acuity was not assessed per patient preference as they have regular follow up with their ophthalmologist. Hearing and body mass index were assessed and reviewed.   During the course of the visit the patient was educated and counseled about appropriate screening and preventive services including : fall prevention , diabetes screening, nutrition counseling, colorectal cancer screening, and recommended immunizations.    Review of Systems  Constitutional: Negative for fever, chills, appetite change, fatigue and unexpected weight change.  HENT: Negative for congestion, ear pain, sinus pressure, sore throat, trouble swallowing and voice change.   Eyes: Negative for visual disturbance.  Respiratory: Negative for cough, shortness of breath, wheezing and stridor.   Cardiovascular: Negative for chest pain, palpitations and leg swelling.  Gastrointestinal: Negative for nausea, vomiting, abdominal pain, diarrhea, constipation, blood in stool, abdominal distention and anal bleeding.  Genitourinary: Negative for dysuria and flank pain.  Musculoskeletal: Negative for arthralgias, gait problem, myalgias and neck pain.  Skin: Negative for color change and rash.  Neurological: Negative for dizziness and headaches.  Hematological: Negative for adenopathy. Does not bruise/bleed easily.  Psychiatric/Behavioral: Negative for suicidal ideas, sleep disturbance and dysphoric mood. The patient is not nervous/anxious.        Objective:  BP 104/62  Pulse 76  Temp(Src) 98.2 F (36.8 C) (Oral)  Resp 16  Ht 5'  3.5" (1.613 m)  Wt 201 lb (91.173 kg)  BMI 35.04 kg/m2  SpO2 97% Physical Exam  Constitutional: She is oriented to person, place, and time. She appears well-developed and well-nourished. No distress.  HENT:  Head: Normocephalic and atraumatic.  Right Ear: External ear normal.  Left Ear: External ear normal.  Nose: Nose normal.  Mouth/Throat: Oropharynx is clear and moist. No oropharyngeal exudate.  Eyes: Conjunctivae are normal. Pupils are equal, round, and reactive to light. Right eye exhibits no discharge. Left eye exhibits no discharge. No scleral icterus.  Neck: Normal range of motion. Neck supple. No tracheal deviation present. No thyromegaly present.  Cardiovascular: Normal rate, regular rhythm, normal heart sounds and intact distal pulses.  Exam reveals no gallop and no friction rub.   No murmur heard. Pulmonary/Chest: Effort normal and breath sounds normal. No accessory muscle usage. Not tachypneic. No respiratory distress. She has no decreased breath sounds. She has no wheezes. She has no rales. She exhibits no tenderness. Right breast exhibits no inverted nipple, no mass, no nipple discharge, no skin change and no tenderness. Left breast exhibits no inverted nipple, no mass, no nipple discharge, no skin change and no tenderness. Breasts are symmetrical.  Abdominal: Soft. Bowel sounds are normal. She exhibits no distension and no mass. There is no tenderness. There is no rebound and no guarding.  Musculoskeletal: Normal range of motion. She exhibits no edema and no tenderness.  Lymphadenopathy:    She has no cervical adenopathy.  Neurological: She is alert and oriented to person, place, and time. No cranial nerve deficit. She exhibits normal muscle tone. Coordination normal.  Skin: Skin is warm and dry. No rash noted. She is not diaphoretic. No erythema. No pallor.  Psychiatric: She has a normal mood and affect. Her behavior is normal. Judgment and thought content normal.           Assessment & Plan:   Problem List Items Addressed This Visit   Hypertension      BP Readings from Last 3 Encounters:  12/12/13 104/62  06/06/13 130/78  12/05/12 110/70   BP well controlled on current medications. Will continue.    Relevant Orders      EKG 12-Lead (Completed)   Medicare annual wellness visit, initial - Primary     General medical exam including breast exam normal. PAP and pelvic deferred per pt preference and h/o hysterectomy. Mammogram ordered. Colonoscopy referral placed. Labs today including CBC, CMP, lipids. EKG normal today. Encouraged healthy diet and regular exercise. Follow up 6 months and prn.    Relevant Orders      Comprehensive metabolic panel      Lipid panel      Microalbumin / creatinine urine ratio      Vit D  25 hydroxy (rtn osteoporosis monitoring)      EKG 12-Lead (Completed)   Obesity (BMI 30-39.9)      Wt Readings from Last 3 Encounters:  12/12/13 201 lb (91.173 kg)  06/06/13 206 lb (93.441 kg)  12/05/12 201 lb (91.173 kg)   Encouraged healthy diet and regular exercise with goal of weight loss.    Psoriasis     Symptoms have recently worsened. Follow up with dermatology to discuss other options pending.    Relevant Orders      CBC with Differential    Other Visit Diagnoses   Screening for breast cancer  Relevant Orders       MM Digital Screening    Screening for colon cancer        Relevant Orders       Ambulatory referral to Gastroenterology    Need for prophylactic vaccination against Streptococcus pneumoniae (pneumococcus)        Relevant Orders       Pneumococcal conjugate vaccine 13-valent        Return in about 6 months (around 06/14/2014).

## 2013-12-13 ENCOUNTER — Telehealth: Payer: Self-pay | Admitting: Internal Medicine

## 2013-12-13 LAB — VITAMIN D 25 HYDROXY (VIT D DEFICIENCY, FRACTURES): Vit D, 25-Hydroxy: 30 ng/mL (ref 30–89)

## 2013-12-13 NOTE — Telephone Encounter (Signed)
Relevant patient education assigned to patient using Emmi. ° °

## 2013-12-15 ENCOUNTER — Ambulatory Visit: Payer: Self-pay | Admitting: Ophthalmology

## 2013-12-16 ENCOUNTER — Encounter: Payer: Self-pay | Admitting: Emergency Medicine

## 2013-12-26 ENCOUNTER — Other Ambulatory Visit: Payer: Self-pay | Admitting: Internal Medicine

## 2013-12-30 ENCOUNTER — Ambulatory Visit: Payer: Self-pay | Admitting: Ophthalmology

## 2014-01-07 ENCOUNTER — Other Ambulatory Visit: Payer: Self-pay | Admitting: Internal Medicine

## 2014-01-29 ENCOUNTER — Encounter: Payer: Self-pay | Admitting: Internal Medicine

## 2014-02-02 ENCOUNTER — Encounter: Payer: Self-pay | Admitting: Internal Medicine

## 2014-02-02 ENCOUNTER — Ambulatory Visit (INDEPENDENT_AMBULATORY_CARE_PROVIDER_SITE_OTHER): Payer: Medicare Other | Admitting: Internal Medicine

## 2014-02-02 VITALS — BP 110/74 | HR 68 | Ht 63.0 in | Wt 204.2 lb

## 2014-02-02 DIAGNOSIS — Z1211 Encounter for screening for malignant neoplasm of colon: Secondary | ICD-10-CM

## 2014-02-02 NOTE — Progress Notes (Signed)
Patient ID: [name], female   DOB: 1951/05/08, 63 y.o.   MRN: [id number] HPI: Jennifer Patrick is a 63 year old female with a past medical history of psoriasis and psoriatic arthritis on Cosentyx, hypothyroidism, GERD, and hypertension who seen in consultation at the request of Dr. Gilford Rile to discuss repeat colorectal cancer screening. She is here alone today. She reports she had a previous colonoscopy 10 years ago at [facility] which was normal. She reports now being due for repeat colonoscopy is being hesitant to proceed. She reports the initial prep was "not pleasant" but not traumatic and she denies complication. She reports she "I don't see the need to repeat screening when everything is okay".  She denies current complaint including no abdominal pain. No change in bowel habits. No blood in her stool or melena. No diarrhea or constipation. She has a history of GERD for which he takes Prilosec 40 mg daily. With this she denies heartburn. No trouble swallowing. No nausea or vomiting. No early satiety. No weight loss. No family history of colon polyps or cancer.  Past Medical History  Diagnosis Date  . Psoriasis     Dr. Nehemiah Massed, on embrel  . Arthritis   . Hypertension   . GERD (gastroesophageal reflux disease)   . Cataract   . Anxiety   . Hypothyroidism   . Sleep apnea     Past Surgical History  Procedure Laterality Date  . Abdominal hysterectomy  1999    for abnormal pap, [facility]  . Breast biopsy Left 1981  . Thyroidectomy  1976  . Vitrectomy Right 2014  . Cataract extraction Bilateral     Current Outpatient Prescriptions  Medication Sig Dispense Refill  . atenolol (TENORMIN) 50 MG tablet TAKE 1 TABLET BY MOUTH DAILY  90 tablet  1  . levothyroxine (SYNTHROID, LEVOTHROID) 125 MCG tablet TAKE 1 TABLET (125 MCG TOTAL) BY MOUTH DAILY.  90 tablet  1  . omeprazole (PRILOSEC) 40 MG capsule TAKE ONE CAPSULE EVERY DAY  90 capsule  1  . Secukinumab (COSENTYX) 150 MG/ML SOSY Inject 300 mg  into the skin once a week.      . sertraline (ZOLOFT) 50 MG tablet TAKE 1 TABLET EVERY DAY  90 tablet  1  . triamterene-hydrochlorothiazide (MAXZIDE-25) 37.5-25 MG per tablet TAKE 1 TABLET BY MOUTH DAILY  90 tablet  1   No current facility-administered medications for this visit.    Allergies  Allergen Reactions  . Penicillins     rash    Family History  Problem Relation Age of Onset  . Breast cancer Mother   . Sudden death Father     committed suicide  . Diabetes Father   . Lung disease Sister     legionaires  . Lung cancer Mother     History  Substance Use Topics  . Smoking status: Never Smoker   . Smokeless tobacco: Never Used  . Alcohol Use: Yes     Comment: socially    ROS: As per history of present illness, otherwise negative  BP 110/74  Pulse 68  Ht 5\' 3"  (1.6 m)  Wt 204 lb 4 oz (92.647 kg)  BMI 36.19 kg/m2 Constitutional: Well-developed and well-nourished. No distress. HEENT: Normocephalic and atraumatic. No scleral icterus. Neck: Neck supple. Trachea midline.  Cardiovascular: Normal rate, regular rhythm and intact distal pulses. No M/R/G Pulmonary/chest: Effort normal and breath sounds normal. No wheezing, rales or rhonchi. Abdominal: Soft, nontender, nondistended. Bowel sounds active throughout.  Extremities: no clubbing, cyanosis, or  edema Neurological: Alert and oriented to person place and time. Skin: Skin is warm and dry. No rashes noted. Psychiatric: Normal mood and affect. Behavior is normal.  RELEVANT LABS AND IMAGING: CBC    Component Value Date/Time   WBC 6.6 12/12/2013 0921   RBC 4.54 12/12/2013 0921   HGB 13.1 12/12/2013 0921   HCT 39.2 12/12/2013 0921   PLT 206.0 12/12/2013 0921   MCV 86.3 12/12/2013 0921   MCHC 33.4 12/12/2013 0921   RDW 14.5 12/12/2013 0921   LYMPHSABS 2.1 12/12/2013 0921   MONOABS 0.6 12/12/2013 0921   EOSABS 0.3 12/12/2013 0921   BASOSABS 0.0 12/12/2013 0921    CMP     Component Value Date/Time   NA 139 12/12/2013  0921   K 3.8 12/12/2013 0921   CL 104 12/12/2013 0921   CO2 26 12/12/2013 0921   GLUCOSE 94 12/12/2013 0921   BUN 13 12/12/2013 0921   CREATININE 1.0 12/12/2013 0921   CALCIUM 9.0 12/12/2013 0921   PROT 6.5 12/12/2013 0921   ALBUMIN 3.8 12/12/2013 0921   AST 27 12/12/2013 0921   ALT 23 12/12/2013 0921   ALKPHOS 81 12/12/2013 0921   BILITOT 0.6 12/12/2013 1517    ASSESSMENT/PLAN: 63 year old female with a past medical history of psoriasis and psoriatic arthritis on Cosentyx, hypothyroidism, GERD, and hypertension who seen in consultation at the request of Dr. Gilford Rile to discuss repeat colorectal cancer screening.  1.  Average risk rectal cancer screening -- we had a long discussion today regarding colorectal cancer screening and current guidelines. She is due for repeat colorectal cancer screening at this time and is average risk. By report she had a normal screening colonoscopy 10 years ago, and is asymptomatic today. I have recommended repeat colorectal cancer screening with either colonoscopy, flexible sigmoidoscopy, CT colonoscopy, barium enema, FOBT or Cologuard.  We discussed Cologuard today and given her reluctance to have prep or procedure, this test would be perfect for her. I told her the best colorectal cancer screening test is the one that she would be willing to perform. She listened to the risks and benefits of each test, and after this discussion she wishes to defer repeat colorectal cancer screening in any form at present. She states she will think about this and contact my office when she reaches her decision. I will let Dr. Gilford Rile know of our visit and is welcome to return as needed.

## 2014-02-02 NOTE — Patient Instructions (Signed)
Dr. Hilarie Fredrickson recommends you have a colonoscopy for cancer screening. When you are ready to make the appointment, please call our office at [phone]

## 2014-04-30 ENCOUNTER — Other Ambulatory Visit: Payer: Self-pay | Admitting: Internal Medicine

## 2014-05-07 ENCOUNTER — Ambulatory Visit: Payer: Self-pay | Admitting: Internal Medicine

## 2014-05-07 LAB — HM MAMMOGRAPHY

## 2014-05-19 ENCOUNTER — Encounter: Payer: Self-pay | Admitting: Internal Medicine

## 2014-06-15 ENCOUNTER — Ambulatory Visit (INDEPENDENT_AMBULATORY_CARE_PROVIDER_SITE_OTHER): Payer: Medicare Other | Admitting: Internal Medicine

## 2014-06-15 ENCOUNTER — Encounter: Payer: Self-pay | Admitting: Internal Medicine

## 2014-06-15 VITALS — BP 124/66 | HR 75 | Temp 98.2°F | Ht 63.5 in | Wt 203.8 lb

## 2014-06-15 DIAGNOSIS — Z1211 Encounter for screening for malignant neoplasm of colon: Secondary | ICD-10-CM

## 2014-06-15 DIAGNOSIS — F3289 Other specified depressive episodes: Secondary | ICD-10-CM

## 2014-06-15 DIAGNOSIS — L409 Psoriasis, unspecified: Secondary | ICD-10-CM

## 2014-06-15 DIAGNOSIS — F329 Major depressive disorder, single episode, unspecified: Secondary | ICD-10-CM

## 2014-06-15 DIAGNOSIS — L408 Other psoriasis: Secondary | ICD-10-CM

## 2014-06-15 DIAGNOSIS — F32A Depression, unspecified: Secondary | ICD-10-CM

## 2014-06-15 DIAGNOSIS — E039 Hypothyroidism, unspecified: Secondary | ICD-10-CM

## 2014-06-15 DIAGNOSIS — I10 Essential (primary) hypertension: Secondary | ICD-10-CM

## 2014-06-15 LAB — COMPREHENSIVE METABOLIC PANEL
ALT: 20 U/L (ref 0–35)
AST: 29 U/L (ref 0–37)
Albumin: 3.9 g/dL (ref 3.5–5.2)
Alkaline Phosphatase: 91 U/L (ref 39–117)
BILIRUBIN TOTAL: 0.8 mg/dL (ref 0.2–1.2)
BUN: 17 mg/dL (ref 6–23)
CO2: 26 mEq/L (ref 19–32)
CREATININE: 1.1 mg/dL (ref 0.4–1.2)
Calcium: 8.8 mg/dL (ref 8.4–10.5)
Chloride: 101 mEq/L (ref 96–112)
GFR: 51.27 mL/min — AB (ref >60.00)
Glucose, Bld: 93 mg/dL (ref 70–99)
Potassium: 3.6 mEq/L (ref 3.5–5.1)
Sodium: 136 mEq/L (ref 135–145)
Total Protein: 7.4 g/dL (ref 6.0–8.3)

## 2014-06-15 LAB — TSH: TSH: 2.91 u[IU]/mL (ref 0.35–4.50)

## 2014-06-15 NOTE — Assessment & Plan Note (Signed)
Symptoms improved with Cosentyx. Will continue.

## 2014-06-15 NOTE — Patient Instructions (Signed)
Labs today.  Flu shot today.

## 2014-06-15 NOTE — Progress Notes (Signed)
Pre visit review using our clinic review tool, if applicable. No additional management support is needed unless otherwise documented below in the visit note. 

## 2014-06-15 NOTE — Assessment & Plan Note (Signed)
Symptoms well controlled on current medications. Will continue. 

## 2014-06-15 NOTE — Assessment & Plan Note (Signed)
BP Readings from Last 3 Encounters:  06/15/14 124/66  02/02/14 110/74  12/12/13 104/62   BP well controlled on current medication. Renal function with labs today.

## 2014-06-15 NOTE — Assessment & Plan Note (Signed)
Will check TSH with labs today. Continue Levothyroxine. 

## 2014-06-15 NOTE — Assessment & Plan Note (Signed)
Reviewed options for screening for colon cancer. Encouraged her to consider option of Cologuard, as this would allow her to avoid prep for colonoscopy, which she is concerned about.

## 2014-06-15 NOTE — Progress Notes (Signed)
Subjective:    Patient ID: Jennifer Patrick, female    DOB: 1951/03/22, 63 y.o.   MRN: [id number]  HPI 63YO female presents for follow up. Feeling well. Started on new medicine for psoriasis, Cosentyx, with significant improvement in skin symptoms. Has some chronic arthralgia in her left hand, and is following with new rheumatologist for this.  Opted not to pursue screening for colon cancer. No new concerns today.  Review of Systems  Constitutional: Negative for fever, chills, appetite change, fatigue and unexpected weight change.  Eyes: Negative for visual disturbance.  Respiratory: Negative for shortness of breath.   Cardiovascular: Negative for chest pain and leg swelling.  Gastrointestinal: Negative for nausea, vomiting, abdominal pain, diarrhea and constipation.  Musculoskeletal: Positive for arthralgias.  Skin: Negative for color change and rash.  Hematological: Negative for adenopathy. Does not bruise/bleed easily.  Psychiatric/Behavioral: Negative for dysphoric mood. The patient is not nervous/anxious.        Objective:    BP 124/66  Pulse 75  Temp(Src) 98.2 F (36.8 C) (Oral)  Ht 5' 3.5" (1.613 m)  Wt 203 lb 12 oz (92.42 kg)  BMI 35.52 kg/m2  SpO2 95% Physical Exam  Constitutional: She is oriented to person, place, and time. She appears well-developed and well-nourished. No distress.  HENT:  Head: Normocephalic and atraumatic.  Right Ear: External ear normal.  Left Ear: External ear normal.  Nose: Nose normal.  Mouth/Throat: Oropharynx is clear and moist. No oropharyngeal exudate.  Eyes: Conjunctivae are normal. Pupils are equal, round, and reactive to light. Right eye exhibits no discharge. Left eye exhibits no discharge. No scleral icterus.  Neck: Normal range of motion. Neck supple. No tracheal deviation present. No thyromegaly present.  Cardiovascular: Normal rate, regular rhythm, normal heart sounds and intact distal pulses.  Exam reveals no gallop and no  friction rub.   No murmur heard. Pulmonary/Chest: Effort normal and breath sounds normal. No accessory muscle usage. Not tachypneic. No respiratory distress. She has no decreased breath sounds. She has no wheezes. She has no rhonchi. She has no rales. She exhibits no tenderness.  Musculoskeletal: Normal range of motion. She exhibits no edema and no tenderness.  Lymphadenopathy:    She has no cervical adenopathy.  Neurological: She is alert and oriented to person, place, and time. No cranial nerve deficit. She exhibits normal muscle tone. Coordination normal.  Skin: Skin is warm and dry. No rash noted. She is not diaphoretic. No erythema. No pallor.  Psychiatric: She has a normal mood and affect. Her behavior is normal. Judgment and thought content normal.          Assessment & Plan:   Problem List Items Addressed This Visit     Unprioritized   Depression     Symptoms well controlled on current medications. Will continue.    Hypertension - Primary      BP Readings from Last 3 Encounters:  06/15/14 124/66  02/02/14 110/74  12/12/13 104/62   BP well controlled on current medication. Renal function with labs today.    Relevant Orders      Comprehensive metabolic panel      Microalbumin / creatinine urine ratio   Psoriasis     Symptoms improved with Cosentyx. Will continue.    Screening for colon cancer     Reviewed options for screening for colon cancer. Encouraged her to consider option of Cologuard, as this would allow her to avoid prep for colonoscopy, which she is concerned about.  Unspecified hypothyroidism     Will check TSH with labs today. Continue Levothyroxine.    Relevant Orders      TSH       Return in about 6 months (around 12/14/2014) for Wellness Visit.

## 2014-07-29 ENCOUNTER — Encounter: Payer: Self-pay | Admitting: Internal Medicine

## 2014-07-29 ENCOUNTER — Other Ambulatory Visit: Payer: Self-pay | Admitting: Internal Medicine

## 2014-07-29 MED ORDER — ATENOLOL 50 MG PO TABS: ORAL_TABLET | ORAL | Status: DC

## 2014-08-03 ENCOUNTER — Other Ambulatory Visit: Payer: Self-pay | Admitting: Internal Medicine

## 2014-08-25 ENCOUNTER — Other Ambulatory Visit: Payer: Self-pay | Admitting: Internal Medicine

## 2014-09-16 ENCOUNTER — Encounter: Payer: Self-pay | Admitting: *Deleted

## 2014-09-30 NOTE — Telephone Encounter (Signed)
Mailed unread message to pt  

## 2014-10-31 ENCOUNTER — Other Ambulatory Visit: Payer: Self-pay | Admitting: Internal Medicine

## 2014-11-02 NOTE — Telephone Encounter (Signed)
Last OV 9.28.15.  Please advise refill

## 2014-12-16 ENCOUNTER — Ambulatory Visit: Payer: Self-pay

## 2014-12-16 ENCOUNTER — Ambulatory Visit (INDEPENDENT_AMBULATORY_CARE_PROVIDER_SITE_OTHER): Payer: Medicare Other | Admitting: Internal Medicine

## 2014-12-16 ENCOUNTER — Encounter: Payer: Self-pay | Admitting: Internal Medicine

## 2014-12-16 ENCOUNTER — Encounter: Payer: Medicare Other | Admitting: Internal Medicine

## 2014-12-16 VITALS — BP 136/84 | HR 70 | Temp 97.9°F | Resp 14 | Ht 63.5 in | Wt 201.5 lb

## 2014-12-16 DIAGNOSIS — E669 Obesity, unspecified: Secondary | ICD-10-CM | POA: Diagnosis not present

## 2014-12-16 DIAGNOSIS — I10 Essential (primary) hypertension: Secondary | ICD-10-CM

## 2014-12-16 DIAGNOSIS — Z6830 Body mass index (BMI) 30.0-30.9, adult: Secondary | ICD-10-CM | POA: Diagnosis not present

## 2014-12-16 DIAGNOSIS — Z23 Encounter for immunization: Secondary | ICD-10-CM | POA: Diagnosis not present

## 2014-12-16 DIAGNOSIS — L409 Psoriasis, unspecified: Secondary | ICD-10-CM

## 2014-12-16 DIAGNOSIS — Z0000 Encounter for general adult medical examination without abnormal findings: Secondary | ICD-10-CM | POA: Diagnosis not present

## 2014-12-16 MED ORDER — SERTRALINE HCL 50 MG PO TABS: 50.0000 mg | ORAL_TABLET | Freq: Every day | ORAL | Status: DC

## 2014-12-16 NOTE — Assessment & Plan Note (Signed)
BP Readings from Last 3 Encounters:  12/16/14 136/84  06/15/14 124/66  02/02/14 110/74   BP well controlled generally. She reports that renal function was recently checked by her rheumatologist, so will request these reports. Continue Maxide and Atenolol.

## 2014-12-16 NOTE — Assessment & Plan Note (Signed)
Symptoms poorly controlled on Cosentyx and Methotrexate. Follow up pending with rheumatology to discuss possible local steroid injections in her hands and alternative treatments.

## 2014-12-16 NOTE — Assessment & Plan Note (Signed)
Wt Readings from Last 3 Encounters:  12/16/14 201 lb 8 oz (91.4 kg)  06/15/14 203 lb 12 oz (92.42 kg)  02/02/14 204 lb 4 oz (92.647 kg)   Body mass index is 35.13 kg/(m^2). Nutrition/dietary guidance provided by health coach today, including healthy food choices and portion control.

## 2014-12-16 NOTE — Patient Instructions (Addendum)

## 2014-12-16 NOTE — Progress Notes (Signed)
Subjective:    Patient ID: Jennifer Patrick, female    DOB: [birth year], 64 y.o.   MRN: [id number]  HPI 64YO female presents for AWV.  Psoriasis - Started on Methorexate by Dr. Amil Amen. Has been on medication for 5 weeks with minimal improvement. Has follow up scheduled. Having more arthraligia in hands. Arms and hands feel heavy. Has trouble buttoning clothes, opening bottles. Taking ibuprofen with some improvement. Pain is worse at night, with stiffness at night.  Recently had labs with Dr. Amil Amen.   Past medical, surgical, family and social history per today's encounter.  Review of Systems  Constitutional: Negative for fever, chills, appetite change, fatigue and unexpected weight change.  Eyes: Negative for visual disturbance.  Respiratory: Negative for shortness of breath.   Cardiovascular: Negative for chest pain and leg swelling.  Gastrointestinal: Negative for nausea, vomiting, abdominal pain, diarrhea, constipation and blood in stool.  Musculoskeletal: Positive for myalgias, joint swelling and arthralgias. Negative for back pain.  Skin: Negative for color change and rash.  Hematological: Negative for adenopathy. Does not bruise/bleed easily.  Psychiatric/Behavioral: Positive for sleep disturbance. Negative for dysphoric mood. The patient is not nervous/anxious.        Objective:    BP 136/84 mmHg  Pulse 70  Temp(Src) 97.9 F (36.6 C) (Oral)  Resp 14  Ht 5' 3.5" (1.613 m)  Wt 201 lb 8 oz (91.4 kg)  BMI 35.13 kg/m2  SpO2 97% Physical Exam  Constitutional: She is oriented to person, place, and time. She appears well-developed and well-nourished. No distress.  HENT:  Head: Normocephalic and atraumatic.  Right Ear: External ear normal.  Left Ear: External ear normal.  Nose: Nose normal.  Mouth/Throat: Oropharynx is clear and moist.  Eyes: Conjunctivae are normal. Pupils are equal, round, and reactive to light. Right eye exhibits no discharge. Left eye exhibits no  discharge. No scleral icterus.  Neck: Normal range of motion. Neck supple. No tracheal deviation present. No thyromegaly present.  Cardiovascular: Normal rate, regular rhythm, normal heart sounds and intact distal pulses.  Exam reveals no gallop and no friction rub.   No murmur heard. Pulmonary/Chest: Effort normal and breath sounds normal. No respiratory distress. She has no wheezes. She has no rales. She exhibits no tenderness.  Musculoskeletal: Normal range of motion. She exhibits no edema or tenderness.       Hands: Lymphadenopathy:    She has no cervical adenopathy.  Neurological: She is alert and oriented to person, place, and time. No cranial nerve deficit. She exhibits normal muscle tone. Coordination normal.  Skin: Skin is warm and dry. No rash noted. She is not diaphoretic. No erythema. No pallor.  Psychiatric: She has a normal mood and affect. Her behavior is normal. Judgment and thought content normal.          Assessment & Plan:   Problem List Items Addressed This Visit      Unprioritized   Hypertension    BP Readings from Last 3 Encounters:  12/16/14 136/84  06/15/14 124/66  02/02/14 110/74   BP well controlled generally. She reports that renal function was recently checked by her rheumatologist, so will request these reports. Continue Maxide and Atenolol.      Medicare annual wellness visit, subsequent - Primary    AWV completed by health coach today. Care gaps identified including need for colorectal cancer screening. Pt declines colonoscopy. Encouraged her to consider Cologuard testing. Information given on this. Mammogram is UTD, due next in 04/2015. Immunizations are  UTD except for Pneumovax which was given today. Appropriate screening performed, see notes. Pt declined routine labs as these were recently completed by rheumatology. Will request labs. Patient was given written screening recommendations, attached in AVS.      Obesity (BMI 30-39.9)    Wt Readings  from Last 3 Encounters:  12/16/14 201 lb 8 oz (91.4 kg)  06/15/14 203 lb 12 oz (92.42 kg)  02/02/14 204 lb 4 oz (92.647 kg)   Body mass index is 35.13 kg/(m^2). Nutrition/dietary guidance provided by health coach today, including healthy food choices and portion control.      Psoriasis    Symptoms poorly controlled on Cosentyx and Methotrexate. Follow up pending with rheumatology to discuss possible local steroid injections in her hands and alternative treatments.       Other Visit Diagnoses    Need for prophylactic vaccination against Streptococcus pneumoniae (pneumococcus)        Relevant Orders    Pneumococcal 23        Return in about 6 months (around 06/18/2015), or if symptoms worsen or fail to improve, for Recheck.

## 2014-12-16 NOTE — Progress Notes (Addendum)
Subjective:   Jennifer Patrick is a 64 y.o. female who presents for Medicare Annual (Subsequent) preventive examination.  Review of Systems:   Cardiac Risk Factors include: hypertension;obesity (BMI >30kg/m2)     Objective:     Vitals: BP 136/84 mmHg  Pulse 70  Temp(Src) 97.9 F (36.6 C) (Oral)  Resp 14  Ht 5' 3.5" (1.613 m)  Wt 201 lb 8 oz (91.4 kg)  BMI 35.13 kg/m2  SpO2 97%  Tobacco History  Smoking status  . Never Smoker   Smokeless tobacco  . Never Used     Counseling given: Not Answered   Past Medical History  Diagnosis Date  . Psoriasis     Dr. Nehemiah Massed, on embrel  . Arthritis   . Hypertension   . GERD (gastroesophageal reflux disease)   . Cataract   . Anxiety   . Hypothyroidism   . Sleep apnea    Past Surgical History  Procedure Laterality Date  . Abdominal hysterectomy  1999    for abnormal pap, [facility]  . Breast biopsy Left 1981  . Thyroidectomy  1976  . Vitrectomy Right 2014  . Cataract extraction Bilateral    Family History  Problem Relation Age of Onset  . Breast cancer Mother   . Sudden death Father     committed suicide  . Diabetes Father   . Lung disease Sister     legionaires  . Lung cancer Mother    History  Sexual Activity  . Sexual Activity: Not on file    Outpatient Encounter Prescriptions as of 12/16/2014  Medication Sig  . atenolol (TENORMIN) 50 MG tablet TAKE 1 TABLET BY MOUTH DAILY  . levothyroxine (SYNTHROID, LEVOTHROID) 125 MCG tablet TAKE 1 TABLET (125 MCG TOTAL) BY MOUTH DAILY.  Marland Kitchen omeprazole (PRILOSEC) 40 MG capsule TAKE ONE CAPSULE EVERY DAY  . sertraline (ZOLOFT) 50 MG tablet TAKE 1 TABLET BY MOUTH EVERY DAY  . triamterene-hydrochlorothiazide (MAXZIDE-25) 37.5-25 MG per tablet TAKE 1 TABLET BY MOUTH DAILY  . folic acid (FOLVITE) 1 MG tablet Take 1 mg by mouth daily.  . methotrexate (RHEUMATREX) 2.5 MG tablet Take 6 tablets by mouth once a week.  . Secukinumab (COSENTYX) 150 MG/ML SOSY Inject 300 mg into the  skin every 30 (thirty) days.     Activities of Daily Living In your present state of health, do you have any difficulty performing the following activities: 12/16/2014  Is the patient deaf or have difficulty hearing? N  Hearing Y  Vision N  Difficulty concentrating or making decisions N  Walking or climbing stairs? N  Doing errands, shopping? N  Preparing Food and eating ? N  Using the Toilet? N  In the past six months, have you accidently leaked urine? N  Do you have problems with loss of bowel control? N  Managing your Medications? N  Managing your Finances? N  Housekeeping or managing your Housekeeping? N    Patient Care Team: Jackolyn Confer, MD as PCP - General (Internal Medicine) Jackolyn Confer, MD (Internal Medicine)    Assessment:    Exercise Activities and Dietary recommendations Current Exercise Habits:: The patient does not participate in regular exercise at present  Goals    . Increase physical activity    . Reduce portion size      Fall Risk Fall Risk  12/16/2014 12/12/2013 12/05/2012  Falls in the past year? Yes No No  Number falls in past yr: 2 or more - -  Injury  with Fall? No - -  Risk for fall due to : Other (Comment) - -  Risk for fall due to (comments): Patient tripped while walking dog no injuries - -   Depression Screen PHQ 2/9 Scores 12/16/2014 12/12/2013 12/05/2012  PHQ - 2 Score 0 0 1     Cognitive Testing MMSE - Mini Mental State Exam 12/16/2014  Orientation to time 5  Orientation to Place 5  Registration 3  Attention/ Calculation 5  Recall 3  Language- name 2 objects 2  Language- repeat 1  Language- follow 3 step command 3  Language- read & follow direction 1  Write a sentence 1  Copy design 1  Total score 30    Immunization History  Administered Date(s) Administered  . Influenza Whole 07/03/2011, 07/07/2012  . Influenza-Unspecified 07/14/2013  . Pneumococcal Conjugate-13 12/12/2013  . Tdap 07/07/2010  . Zoster 10/05/2011    Screening Tests Health Maintenance  Topic Date Due  . COLONOSCOPY  12/05/2013  . PNA vac Low Risk Adult (2 of 2 - PPSV23) 12/13/2014  . INFLUENZA VACCINE  12/17/2014 (Originally 04/18/2014)  . MAMMOGRAM  05/07/2016  . TETANUS/TDAP  07/07/2020  . DEXA SCAN  Completed  . ZOSTAVAX  Completed      Plan:    During the course of the visit the patient was educated and counseled about the following appropriate screening and preventive services:   Vaccines to include Pneumoccal, Influenza, Hepatitis B, Td, Zostavax, HCV  Electrocardiogram  Cardiovascular Disease  Colorectal cancer screening  Bone density screening  Diabetes screening  Glaucoma screening  Mammography/PAP  Nutrition counseling   Patient Instructions (the written plan) was given to the patient.   Geni Bers, LPN  6/60/6301      Subjective:    Jennifer Patrick is a 64 y.o. female who presents for Medicare Annual/Subsequent preventive examination.  Preventive Screening-Counseling & Management  Tobacco History  Smoking status  . Never Smoker   Smokeless tobacco  . Never Used     Problems Prior to Visit 1.   Current Problems (verified) Patient Active Problem List   Diagnosis Date Noted  . Screening for colon cancer 06/15/2014  . Medicare annual wellness visit, initial 12/12/2013  . Obesity (BMI 30-39.9) 06/06/2013  . Unspecified hypothyroidism 12/05/2012  . Psoriasis 12/05/2012  . Anxiety state, unspecified 12/05/2012  . Hypertension 07/03/2011  . Depression 07/03/2011    Medications Prior to Visit Current Outpatient Prescriptions on File Prior to Visit  Medication Sig Dispense Refill  . atenolol (TENORMIN) 50 MG tablet TAKE 1 TABLET BY MOUTH DAILY 90 tablet 1  . levothyroxine (SYNTHROID, LEVOTHROID) 125 MCG tablet TAKE 1 TABLET (125 MCG TOTAL) BY MOUTH DAILY. 90 tablet 1  . omeprazole (PRILOSEC) 40 MG capsule TAKE ONE CAPSULE EVERY DAY 90 capsule 1  . sertraline  (ZOLOFT) 50 MG tablet TAKE 1 TABLET BY MOUTH EVERY DAY 90 tablet 0  . triamterene-hydrochlorothiazide (MAXZIDE-25) 37.5-25 MG per tablet TAKE 1 TABLET BY MOUTH DAILY 90 tablet 1  . Secukinumab (COSENTYX) 150 MG/ML SOSY Inject 300 mg into the skin every 30 (thirty) days.      No current facility-administered medications on file prior to visit.    Current Medications (verified) Current Outpatient Prescriptions  Medication Sig Dispense Refill  . atenolol (TENORMIN) 50 MG tablet TAKE 1 TABLET BY MOUTH DAILY 90 tablet 1  . levothyroxine (SYNTHROID, LEVOTHROID) 125 MCG tablet TAKE 1 TABLET (125 MCG TOTAL) BY MOUTH DAILY. 90 tablet 1  . omeprazole (PRILOSEC)  40 MG capsule TAKE ONE CAPSULE EVERY DAY 90 capsule 1  . sertraline (ZOLOFT) 50 MG tablet TAKE 1 TABLET BY MOUTH EVERY DAY 90 tablet 0  . triamterene-hydrochlorothiazide (MAXZIDE-25) 37.5-25 MG per tablet TAKE 1 TABLET BY MOUTH DAILY 90 tablet 1  . folic acid (FOLVITE) 1 MG tablet Take 1 mg by mouth daily.  3  . methotrexate (RHEUMATREX) 2.5 MG tablet Take 6 tablets by mouth once a week.  3  . Secukinumab (COSENTYX) 150 MG/ML SOSY Inject 300 mg into the skin every 30 (thirty) days.      No current facility-administered medications for this visit.     Allergies (verified) Penicillins   PAST HISTORY  Family History Family History  Problem Relation Age of Onset  . Breast cancer Mother   . Sudden death Father     committed suicide  . Diabetes Father   . Lung disease Sister     legionaires  . Lung cancer Mother     Social History History  Substance Use Topics  . Smoking status: Never Smoker   . Smokeless tobacco: Never Used  . Alcohol Use: Yes     Comment: socially     Are there smokers in your home (other than you)? No  Risk Factors Current exercise habits: The patient does not participate in regular exercise at present.  Dietary issues discussed:   Cardiac risk factors: obesity (BMI >= 30 kg/m2).  Depression  Screen (Note: if answer to either of the following is "Yes", a more complete depression screening is indicated)   Over the past two weeks, have you felt down, depressed or hopeless? No  Over the past two weeks, have you felt little interest or pleasure in doing things? No  Have you lost interest or pleasure in daily life? No  Do you often feel hopeless? No  Do you cry easily over simple problems? No  Activities of Daily Living In your present state of health, do you have any difficulty performing the following activities?:  Driving? No Managing money?  No Feeding yourself? No Getting from bed to chair? No Climbing a flight of stairs? No Preparing food and eating?: No Bathing or showering? No Getting dressed: No Getting to the toilet? No Using the toilet:No Moving around from place to place: No In the past year have you fallen or had a near fall?:No   Are you sexually active?  No  Do you have more than one partner?  No    Indicate any recent Medical Services you may have received from other than [facility] providers in the past year (date may be approximate).  Immunization History  Administered Date(s) Administered  . Influenza Whole 07/03/2011, 07/07/2012  . Influenza-Unspecified 07/14/2013  . Pneumococcal Conjugate-13 12/12/2013  . Tdap 07/07/2010  . Zoster 10/05/2011    Screening Tests Health Maintenance  Topic Date Due  . COLONOSCOPY  12/05/2013  . PNA vac Low Risk Adult (2 of 2 - PPSV23) 12/13/2014  . INFLUENZA VACCINE  12/17/2014 (Originally 04/18/2014)  . MAMMOGRAM  05/07/2016  . TETANUS/TDAP  07/07/2020  . DEXA SCAN  Completed  . ZOSTAVAX  Completed    All answers were reviewed with the patient and necessary referrals were made:  Rica Mast, MD   12/16/2014   History reviewed: allergies, current medications, past family history, past medical history, past social history, past surgical history and problem list  Review of Systems A comprehensive  review of systems was negative.    Objective:  Vision by Snellen chart: right VOH:YWVPXTG declines measurement, left GYI:RSWNIOE declines measurement  Body mass index is 35.13 kg/(m^2). BP 136/84 mmHg  Pulse 70  Temp(Src) 97.9 F (36.6 C) (Oral)  Resp 14  Ht 5' 3.5" (1.613 m)  Wt 201 lb 8 oz (91.4 kg)  BMI 35.13 kg/m2  SpO2 97%      Assessment:         Plan:     During the course of the visit the patient was educated and counseled about appropriate screening and preventive services including.   Patient Instructions (the written plan) was given to the patient.  Medicare Attestation I have personally reviewed: The patient's medical and social history Their use of alcohol, tobacco or illicit drugs Their current medications and supplements The patient's functional ability including ADLs,fall risks, home safety risks, cognitive, and hearing and visual impairment Diet and physical activities Evidence for depression or mood disorders  The patient's weight, height, BMI, and visual acuity have been recorded in the chart.  I have made referrals, counseling, and provided education to the patient based on review of the above and I have provided the patient with a written personalized care plan for preventive services.     Rica Mast, MD   12/16/2014        Subjective:    Jennifer Patrick is a 64 y.o. female who presents for Medicare Annual/Subsequent preventive examination.  Preventive Screening-Counseling & Management  Tobacco History  Smoking status  . Never Smoker   Smokeless tobacco  . Never Used     Problems Prior to Visit 1.   Current Problems (verified) Patient Active Problem List   Diagnosis Date Noted  . Medicare annual wellness visit, subsequent 12/16/2014  . Screening for colon cancer 06/15/2014  . Obesity (BMI 30-39.9) 06/06/2013  . Hypothyroidism 12/05/2012  . Psoriasis 12/05/2012  . Hypertension 07/03/2011  . Depression  07/03/2011    Medications Prior to Visit Current Outpatient Prescriptions on File Prior to Visit  Medication Sig Dispense Refill  . atenolol (TENORMIN) 50 MG tablet TAKE 1 TABLET BY MOUTH DAILY 90 tablet 1  . levothyroxine (SYNTHROID, LEVOTHROID) 125 MCG tablet TAKE 1 TABLET (125 MCG TOTAL) BY MOUTH DAILY. 90 tablet 1  . omeprazole (PRILOSEC) 40 MG capsule TAKE ONE CAPSULE EVERY DAY 90 capsule 1  . triamterene-hydrochlorothiazide (MAXZIDE-25) 37.5-25 MG per tablet TAKE 1 TABLET BY MOUTH DAILY 90 tablet 1  . Secukinumab (COSENTYX) 150 MG/ML SOSY Inject 300 mg into the skin every 30 (thirty) days.      No current facility-administered medications on file prior to visit.    Current Medications (verified) Current Outpatient Prescriptions  Medication Sig Dispense Refill  . atenolol (TENORMIN) 50 MG tablet TAKE 1 TABLET BY MOUTH DAILY 90 tablet 1  . levothyroxine (SYNTHROID, LEVOTHROID) 125 MCG tablet TAKE 1 TABLET (125 MCG TOTAL) BY MOUTH DAILY. 90 tablet 1  . omeprazole (PRILOSEC) 40 MG capsule TAKE ONE CAPSULE EVERY DAY 90 capsule 1  . sertraline (ZOLOFT) 50 MG tablet Take 1 tablet (50 mg total) by mouth daily. 90 tablet 3  . triamterene-hydrochlorothiazide (MAXZIDE-25) 37.5-25 MG per tablet TAKE 1 TABLET BY MOUTH DAILY 90 tablet 1  . folic acid (FOLVITE) 1 MG tablet Take 1 mg by mouth daily.  3  . methotrexate (RHEUMATREX) 2.5 MG tablet Take 6 tablets by mouth once a week.  3  . Secukinumab (COSENTYX) 150 MG/ML SOSY Inject 300 mg into the skin every 30 (thirty) days.  No current facility-administered medications for this visit.     Allergies (verified) Penicillins   PAST HISTORY  Family History Family History  Problem Relation Age of Onset  . Breast cancer Mother   . Sudden death Father     committed suicide  . Diabetes Father   . Lung disease Sister     legionaires  . Lung cancer Mother     Social History History  Substance Use Topics  . Smoking status: Never  Smoker   . Smokeless tobacco: Never Used  . Alcohol Use: Yes     Comment: socially       Immunization History  Administered Date(s) Administered  . Influenza Whole 07/03/2011, 07/07/2012  . Influenza-Unspecified 07/14/2013  . Pneumococcal Conjugate-13 12/12/2013  . Pneumococcal Polysaccharide-23 12/16/2014  . Tdap 07/07/2010  . Zoster 10/05/2011    Screening Tests Health Maintenance  Topic Date Due  . COLONOSCOPY  12/05/2013  . PNA vac Low Risk Adult (2 of 2 - PPSV23) 12/13/2014  . INFLUENZA VACCINE  12/17/2014 (Originally 04/18/2014)  . MAMMOGRAM  05/07/2016  . TETANUS/TDAP  07/07/2020  . DEXA SCAN  Completed  . ZOSTAVAX  Completed     Medicare Attestation I have personally reviewed: The patient's medical and social history Their use of alcohol, tobacco or illicit drugs Their current medications and supplements The patient's functional ability including ADLs,fall risks, home safety risks, cognitive, and hearing and visual impairment Diet and physical activities Evidence for depression or mood disorders  The patient's weight, height, BMI, and visual acuity have been recorded in the chart.  I have made referrals, counseling, and provided education to the patient based on review of the above and I have provided the patient with a written personalized care plan for preventive services.     Rica Mast, MD   12/16/2014   Annual Wellness Visit as completed by Health Coach was reviewed in full.

## 2014-12-16 NOTE — Assessment & Plan Note (Signed)
AWV completed by health coach today. Care gaps identified including need for colorectal cancer screening. Pt declines colonoscopy. Encouraged her to consider Cologuard testing. Information given on this. Mammogram is UTD, due next in 04/2015. Immunizations are UTD except for Pneumovax which was given today. Appropriate screening performed, see notes. Pt declined routine labs as these were recently completed by rheumatology. Will request labs. Patient was given written screening recommendations, attached in AVS.

## 2015-01-08 NOTE — Op Note (Signed)
PATIENT NAME:  Jennifer Patrick, Jennifer Patrick MR#:  [id number] DATE OF BIRTH:  11-Nov-1950  DATE OF PROCEDURE:  04/02/2013  PROCEDURES PERFORMED: 1.  Pars plana vitrectomy of the right eye.  2.  Internal limiting membrane peel of the right eye.   PREOPERATIVE DIAGNOSES: 1.  Epiretinal membrane.  2.  Retinal edema.   POSTOPERATIVE DIAGNOSES: 1.  Epiretinal membrane.  2.  Retinal edema.   PRIMARY SURGEON:  Coralee Rud, M.D.   ANESTHESIA: Retrobulbar block of the right eye with monitored anesthesia care.   COMPLICATIONS: None.   INDICATIONS FOR PROCEDURE: This is a patient who had progressive loss of vision over several months in the right eye. Examination revealed an epiretinal membrane with associated retinal edema in vision of 20/50 or worse. Risks, benefits, and alternatives of the above procedure were discussed and the patient wished to proceed.   DETAILS OF PROCEDURE: After informed consent was obtained, the patient was brought into the operative suite at [facility]. The patient was placed in supine position and was given a small dose of propofol and a retrobulbar block was performed on the right eye by the primary surgeon without complications. The right eye was prepped and draped in sterile manner. After lid speculum was inserted, a 25-gauge trocar was placed inferotemporally through displaced conjunctiva in an oblique fashion 4 mm beyond the limbus. The infusion cannula was turned on and inserted through the trocar and secured in position with Steri-Strips. Two more trocars were placed in a similar fashion superotemporally and superonasally. The vitreous cutter and light pipe were introduced in the eye and a core vitrectomy was performed. The vitreous face was confirmed as already elevated. The vitreous face was removed and the peripheral vitreous was trimmed for 360 degrees with care taken to avoid hitting the crystalline lens. Indocyanine green was injected onto the  posterior pole and removed within 30 seconds. A Tano brush was used to create two internal limiting membrane flaps.  The internal limiting membrane was peeled for 360 degrees around the fovea for a total diameter of two disk diameters. Scleral depressed exam was performed for 360 degrees and no signs of any breaks, tears, or retinal detachment could be identified. A partial air-fluid exchange was performed and the trocars were removed. Wounds were noted to be airtight and pressure in the eye was confirmed to be approximately 15 mmHg. 5 mg of dexamethasone was given into the inferior fornix and the lid speculum was removed. The eye was cleaned and TobraDex was placed on the eye. A patch and shield were placed over the eye and the patient was taken to postanesthesia care with instructions to remain head up.  ____________________________ Teresa Pelton. Starling Manns, MD [id number] D: 04/02/2013 08:56:00 ET T: 04/02/2013 09:38:05 ET JOB#: [id number]  cc: Teresa Pelton. Starling Manns, MD, <Dictator> Coralee Rud MD ELECTRONICALLY SIGNED 04/09/2013 7:10

## 2015-01-29 ENCOUNTER — Other Ambulatory Visit: Payer: Self-pay | Admitting: Internal Medicine

## 2015-01-29 NOTE — Telephone Encounter (Signed)
Last visit 6/15 ok to fill?

## 2015-01-30 ENCOUNTER — Other Ambulatory Visit: Payer: Self-pay | Admitting: Internal Medicine

## 2015-02-01 NOTE — Telephone Encounter (Signed)
Last OV 9.28.15, last refill 2.17.16.  Please advise refill

## 2015-02-03 ENCOUNTER — Other Ambulatory Visit: Payer: Self-pay | Admitting: Internal Medicine

## 2015-05-01 ENCOUNTER — Other Ambulatory Visit: Payer: Self-pay | Admitting: Internal Medicine

## 2015-05-27 ENCOUNTER — Encounter: Payer: Self-pay | Admitting: Internal Medicine

## 2015-06-16 ENCOUNTER — Encounter: Payer: Self-pay | Admitting: Internal Medicine

## 2015-06-16 ENCOUNTER — Ambulatory Visit (INDEPENDENT_AMBULATORY_CARE_PROVIDER_SITE_OTHER): Payer: Medicare Other | Admitting: Internal Medicine

## 2015-06-16 VITALS — BP 114/80 | HR 86 | Temp 98.1°F | Ht 63.5 in | Wt 208.2 lb

## 2015-06-16 DIAGNOSIS — I10 Essential (primary) hypertension: Secondary | ICD-10-CM

## 2015-06-16 DIAGNOSIS — E039 Hypothyroidism, unspecified: Secondary | ICD-10-CM | POA: Diagnosis not present

## 2015-06-16 DIAGNOSIS — A047 Enterocolitis due to Clostridium difficile: Secondary | ICD-10-CM

## 2015-06-16 DIAGNOSIS — A0472 Enterocolitis due to Clostridium difficile, not specified as recurrent: Secondary | ICD-10-CM

## 2015-06-16 LAB — CBC WITH DIFFERENTIAL/PLATELET
BASOS PCT: 0.4 % (ref 0.0–3.0)
Basophils Absolute: 0 10*3/uL (ref 0.0–0.1)
EOS ABS: 0.3 10*3/uL (ref 0.0–0.7)
Eosinophils Relative: 3.3 % (ref 0.0–5.0)
HEMATOCRIT: 41.3 % (ref 36.0–46.0)
HEMOGLOBIN: 13.6 g/dL (ref 12.0–15.0)
LYMPHS PCT: 25.7 % (ref 12.0–46.0)
Lymphs Abs: 2.1 10*3/uL (ref 0.7–4.0)
MCHC: 33 g/dL (ref 30.0–36.0)
MCV: 87 fl (ref 78.0–100.0)
MONOS PCT: 6.3 % (ref 3.0–12.0)
Monocytes Absolute: 0.5 10*3/uL (ref 0.1–1.0)
NEUTROS ABS: 5.3 10*3/uL (ref 1.4–7.7)
Neutrophils Relative %: 64.3 % (ref 43.0–77.0)
PLATELETS: 238 10*3/uL (ref 150.0–400.0)
RBC: 4.74 Mil/uL (ref 3.87–5.11)
RDW: 14.5 % (ref 11.5–15.5)
WBC: 8.3 10*3/uL (ref 4.0–10.5)

## 2015-06-16 LAB — COMPREHENSIVE METABOLIC PANEL
ALBUMIN: 3.7 g/dL (ref 3.5–5.2)
ALT: 23 U/L (ref 0–35)
AST: 30 U/L (ref 0–37)
Alkaline Phosphatase: 83 U/L (ref 39–117)
BUN: 11 mg/dL (ref 6–23)
CALCIUM: 9.1 mg/dL (ref 8.4–10.5)
CO2: 29 meq/L (ref 19–32)
CREATININE: 0.88 mg/dL (ref 0.40–1.20)
Chloride: 102 mEq/L (ref 96–112)
GFR: 68.21 mL/min (ref >60.00)
Glucose, Bld: 140 mg/dL — ABNORMAL HIGH (ref 70–99)
Potassium: 3.8 mEq/L (ref 3.5–5.1)
Sodium: 139 mEq/L (ref 135–145)
Total Bilirubin: 0.5 mg/dL (ref 0.2–1.2)
Total Protein: 7 g/dL (ref 6.0–8.3)

## 2015-06-16 LAB — TSH: TSH: 3.57 u[IU]/mL (ref 0.35–4.50)

## 2015-06-16 NOTE — Progress Notes (Signed)
Pre visit review using our clinic review tool, if applicable. No additional management support is needed unless otherwise documented below in the visit note. 

## 2015-06-16 NOTE — Assessment & Plan Note (Signed)
Recent CDiff Colitis. Completed Flagyl. Symptoms have resolved. Exam normal. Will recheck CDiff toxin assay. Follow up 4 weeks and prn.

## 2015-06-16 NOTE — Patient Instructions (Addendum)
Labs today.  Please submit a stool sample to recheck for CDiff.  Follow up in 4 weeks.

## 2015-06-16 NOTE — Progress Notes (Signed)
Subjective:    Patient ID: Jennifer Patrick, female    DOB: [birth year], 64 y.o.   MRN: [id number]  HPI  64YO female presents for follow up.  Recently had CDiff after being treated with Clindamycin.  Finished Flagyl, 2 week course. Feeling better. No recent diarrhea. Some soft stools. No abdominal cramping. Energy level improved. No fever, chills. Stopped immunosuppressants.    Wt Readings from Last 3 Encounters:  06/16/15 208 lb 4 oz (94.462 kg)  12/16/14 201 lb 8 oz (91.4 kg)  06/15/14 203 lb 12 oz (92.42 kg)   BP Readings from Last 3 Encounters:  06/16/15 114/80  12/16/14 136/84  06/15/14 124/66    Past Medical History  Diagnosis Date  . Psoriasis     Dr. Nehemiah Massed, on embrel  . Arthritis   . Hypertension   . GERD (gastroesophageal reflux disease)   . Cataract   . Anxiety   . Hypothyroidism   . Sleep apnea    Family History  Problem Relation Age of Onset  . Breast cancer Mother   . Sudden death Father     committed suicide  . Diabetes Father   . Lung disease Sister     legionaires  . Lung cancer Mother    Past Surgical History  Procedure Laterality Date  . Abdominal hysterectomy  1999    for abnormal pap, [facility]  . Breast biopsy Left 1981  . Thyroidectomy  1976  . Vitrectomy Right 2014  . Cataract extraction Bilateral    Social History   Social History  . Marital Status: Married    Spouse Name: N/A  . Number of Children: 2  . Years of Education: N/A   Occupational History  . retired    Social History Main Topics  . Smoking status: Never Smoker   . Smokeless tobacco: Never Used  . Alcohol Use: Yes     Comment: socially  . Drug Use: No  . Sexual Activity: Not Asked   Other Topics Concern  . None   Social History Narrative   Works at [employer]. Lives in [place].    Review of Systems  Constitutional: Negative for fever, chills, appetite change, fatigue and unexpected weight change.  Eyes: Negative for visual disturbance.    Respiratory: Negative for shortness of breath.   Cardiovascular: Negative for chest pain and leg swelling.  Gastrointestinal: Negative for nausea, vomiting, abdominal pain, diarrhea, constipation and abdominal distention.  Skin: Negative for color change and rash.  Hematological: Negative for adenopathy. Does not bruise/bleed easily.  Psychiatric/Behavioral: Negative for sleep disturbance and dysphoric mood. The patient is not nervous/anxious.        Objective:    BP 114/80 mmHg  Pulse 86  Temp(Src) 98.1 F (36.7 C) (Oral)  Ht 5' 3.5" (1.613 m)  Wt 208 lb 4 oz (94.462 kg)  BMI 36.31 kg/m2  SpO2 97% Physical Exam  Constitutional: She is oriented to person, place, and time. She appears well-developed and well-nourished. No distress.  HENT:  Head: Normocephalic and atraumatic.  Right Ear: External ear normal.  Left Ear: External ear normal.  Nose: Nose normal.  Mouth/Throat: Oropharynx is clear and moist. No oropharyngeal exudate.  Eyes: Conjunctivae and EOM are normal. Pupils are equal, round, and reactive to light. Right eye exhibits no discharge.  Neck: Normal range of motion. Neck supple. No thyromegaly present.  Cardiovascular: Normal rate, regular rhythm, normal heart sounds and intact distal pulses.  Exam reveals no gallop and no friction rub.  No murmur heard. Pulmonary/Chest: Effort normal. No respiratory distress. She has no wheezes. She has no rales.  Abdominal: Soft. Bowel sounds are normal. She exhibits no distension and no mass. There is no tenderness. There is no rebound and no guarding.  Musculoskeletal: Normal range of motion. She exhibits no edema or tenderness.  Lymphadenopathy:    She has no cervical adenopathy.  Neurological: She is alert and oriented to person, place, and time. No cranial nerve deficit. Coordination normal.  Skin: Skin is warm and dry. No rash noted. She is not diaphoretic. No erythema. No pallor.  Psychiatric: She has a normal mood and  affect. Her behavior is normal. Judgment and thought content normal.          Assessment & Plan:   Problem List Items Addressed This Visit      Unprioritized   Clostridium difficile colitis - Primary    Recent CDiff Colitis. Completed Flagyl. Symptoms have resolved. Exam normal. Will recheck CDiff toxin assay. Follow up 4 weeks and prn.      Relevant Orders   CBC with Differential/Platelet   Comprehensive metabolic panel   Stool C-Diff Toxin Assay   Hypertension    BP Readings from Last 3 Encounters:  06/16/15 114/80  12/16/14 136/84  06/15/14 124/66   BP well controlled. Renal function with labs.       Hypothyroidism    Will check TSH with labs today. Continue Levothyroxine.      Relevant Orders   TSH       Return in about 4 weeks (around 07/14/2015) for Recheck.

## 2015-06-16 NOTE — Assessment & Plan Note (Signed)
Will check TSH with labs today. Continue Levothyroxine. 

## 2015-06-16 NOTE — Assessment & Plan Note (Signed)
BP Readings from Last 3 Encounters:  06/16/15 114/80  12/16/14 136/84  06/15/14 124/66   BP well controlled. Renal function with labs.

## 2015-06-17 ENCOUNTER — Other Ambulatory Visit: Payer: Self-pay | Admitting: Internal Medicine

## 2015-06-18 LAB — C. DIFFICILE GDH AND TOXIN A/B

## 2015-07-14 ENCOUNTER — Ambulatory Visit (INDEPENDENT_AMBULATORY_CARE_PROVIDER_SITE_OTHER): Payer: Medicare Other | Admitting: Internal Medicine

## 2015-07-14 ENCOUNTER — Encounter: Payer: Self-pay | Admitting: Internal Medicine

## 2015-07-14 VITALS — BP 131/72 | HR 70 | Temp 98.1°F | Ht 63.5 in | Wt 205.5 lb

## 2015-07-14 DIAGNOSIS — H3530 Unspecified macular degeneration: Secondary | ICD-10-CM | POA: Diagnosis not present

## 2015-07-14 DIAGNOSIS — L409 Psoriasis, unspecified: Secondary | ICD-10-CM

## 2015-07-14 DIAGNOSIS — E039 Hypothyroidism, unspecified: Secondary | ICD-10-CM | POA: Diagnosis not present

## 2015-07-14 DIAGNOSIS — I10 Essential (primary) hypertension: Secondary | ICD-10-CM

## 2015-07-14 DIAGNOSIS — A047 Enterocolitis due to Clostridium difficile: Secondary | ICD-10-CM | POA: Diagnosis not present

## 2015-07-14 DIAGNOSIS — Z23 Encounter for immunization: Secondary | ICD-10-CM | POA: Diagnosis not present

## 2015-07-14 DIAGNOSIS — A0472 Enterocolitis due to Clostridium difficile, not specified as recurrent: Secondary | ICD-10-CM

## 2015-07-14 NOTE — Assessment & Plan Note (Signed)
BP Readings from Last 3 Encounters:  07/14/15 131/72  06/16/15 114/80  12/16/14 136/84   BP well controlled. Recent renal function normal. Continue current medication.

## 2015-07-14 NOTE — Assessment & Plan Note (Signed)
Recently seen by rheumatology. Methotrexate stopped. Will continue Coxentyx.

## 2015-07-14 NOTE — Assessment & Plan Note (Signed)
Continue Levothyroxine. Lab Results  Component Value Date   TSH 3.57 06/16/2015

## 2015-07-14 NOTE — Patient Instructions (Signed)
We will set up evaluation with [facility].  Follow up 6 months.

## 2015-07-14 NOTE — Assessment & Plan Note (Signed)
Symptom have resolved. Will follow for any recurrent symptoms.

## 2015-07-14 NOTE — Progress Notes (Signed)
Subjective:    Patient ID: Jennifer Patrick, female    DOB: 1951-03-26, 64 y.o.   MRN: [id number]  HPI  64YO female presents for follow up.  Recently seen for CDiff colitis. No further diarrhea, abdominal pain, nausea.  Seen by rheumatology this morning. Decided to stay off Methotrexate. Will continue Cosentyx.  Also seen by ophthalmology. Has dry macular degeneration right eye. Would like to get a second option regarding this. Notes decreased visual acuity with both near and far. Unable to read out of right eye.  Wt Readings from Last 3 Encounters:  07/14/15 205 lb 8 oz (93.214 kg)  06/16/15 208 lb 4 oz (94.462 kg)  12/16/14 201 lb 8 oz (91.4 kg)   BP Readings from Last 3 Encounters:  07/14/15 131/72  06/16/15 114/80  12/16/14 136/84    Past Medical History  Diagnosis Date  . Psoriasis     Dr. Nehemiah Massed, on embrel  . Arthritis   . Hypertension   . GERD (gastroesophageal reflux disease)   . Cataract   . Anxiety   . Hypothyroidism   . Sleep apnea    Family History  Problem Relation Age of Onset  . Breast cancer Mother   . Sudden death Father     committed suicide  . Diabetes Father   . Lung disease Sister     legionaires  . Lung cancer Mother    Past Surgical History  Procedure Laterality Date  . Abdominal hysterectomy  1999    for abnormal pap, [facility]  . Breast biopsy Left 1981  . Thyroidectomy  1976  . Vitrectomy Right 2014  . Cataract extraction Bilateral    Social History   Social History  . Marital Status: Married    Spouse Name: N/A  . Number of Children: 2  . Years of Education: N/A   Occupational History  . retired    Social History Main Topics  . Smoking status: Never Smoker   . Smokeless tobacco: Never Used  . Alcohol Use: Yes     Comment: socially  . Drug Use: No  . Sexual Activity: Not Asked   Other Topics Concern  . None   Social History Narrative   Works at [employer]. Lives in [place].    Review of Systems    Constitutional: Negative for fever, chills, appetite change, fatigue and unexpected weight change.  Eyes: Positive for visual disturbance. Negative for pain, discharge, redness and itching.  Respiratory: Negative for shortness of breath.   Cardiovascular: Negative for chest pain and leg swelling.  Gastrointestinal: Negative for nausea, vomiting, abdominal pain, diarrhea, constipation, blood in stool and abdominal distention.  Musculoskeletal: Positive for arthralgias. Negative for myalgias.  Skin: Negative for color change and rash.  Hematological: Negative for adenopathy. Does not bruise/bleed easily.  Psychiatric/Behavioral: Negative for sleep disturbance and dysphoric mood. The patient is not nervous/anxious.        Objective:    BP 131/72 mmHg  Pulse 70  Temp(Src) 98.1 F (36.7 C) (Oral)  Ht 5' 3.5" (1.613 m)  Wt 205 lb 8 oz (93.214 kg)  BMI 35.83 kg/m2  SpO2 99% Physical Exam  Constitutional: She is oriented to person, place, and time. She appears well-developed and well-nourished. No distress.  HENT:  Head: Normocephalic and atraumatic.  Right Ear: External ear normal.  Left Ear: External ear normal.  Nose: Nose normal.  Mouth/Throat: Oropharynx is clear and moist. No oropharyngeal exudate.  Eyes: Conjunctivae are normal. Pupils are equal, round, and  reactive to light. Right eye exhibits no discharge. Left eye exhibits no discharge. No scleral icterus.  Neck: Normal range of motion. Neck supple. No tracheal deviation present. No thyromegaly present.  Cardiovascular: Normal rate, regular rhythm, normal heart sounds and intact distal pulses.  Exam reveals no gallop and no friction rub.   No murmur heard. Pulmonary/Chest: Effort normal and breath sounds normal. No respiratory distress. She has no wheezes. She has no rales. She exhibits no tenderness.  Musculoskeletal: Normal range of motion. She exhibits no edema or tenderness.  Lymphadenopathy:    She has no cervical  adenopathy.  Neurological: She is alert and oriented to person, place, and time. No cranial nerve deficit. She exhibits normal muscle tone. Coordination normal.  Skin: Skin is warm and dry. No rash noted. She is not diaphoretic. No erythema. No pallor.  Psychiatric: She has a normal mood and affect. Her behavior is normal. Judgment and thought content normal.          Assessment & Plan:   Problem List Items Addressed This Visit      Unprioritized   Clostridium difficile colitis - Primary    Symptom have resolved. Will follow for any recurrent symptoms.      Hypertension    BP Readings from Last 3 Encounters:  07/14/15 131/72  06/16/15 114/80  12/16/14 136/84   BP well controlled. Recent renal function normal. Continue current medication.      Hypothyroidism    Continue Levothyroxine. Lab Results  Component Value Date   TSH 3.57 06/16/2015         Macular degeneration of right eye    Macular degeneration of right eye. Will set up second opinion regarding treatment options at [facility].      Relevant Orders   Ambulatory referral to Ophthalmology   Psoriasis    Recently seen by rheumatology. Methotrexate stopped. Will continue Coxentyx.          Return in about 6 months (around 01/12/2016) for Physical.

## 2015-07-14 NOTE — Assessment & Plan Note (Signed)
Macular degeneration of right eye. Will set up second opinion regarding treatment options at [facility].

## 2015-07-14 NOTE — Progress Notes (Signed)
Pre visit review using our clinic review tool, if applicable. No additional management support is needed unless otherwise documented below in the visit note. 

## 2015-07-21 ENCOUNTER — Other Ambulatory Visit: Payer: Self-pay | Admitting: Internal Medicine

## 2015-07-22 ENCOUNTER — Other Ambulatory Visit: Payer: Self-pay | Admitting: Internal Medicine

## 2015-07-23 ENCOUNTER — Other Ambulatory Visit: Payer: Self-pay | Admitting: Internal Medicine

## 2015-08-03 ENCOUNTER — Other Ambulatory Visit: Payer: Self-pay | Admitting: Internal Medicine

## 2015-10-19 DIAGNOSIS — L4050 Arthropathic psoriasis, unspecified: Secondary | ICD-10-CM | POA: Diagnosis not present

## 2015-10-19 DIAGNOSIS — M7989 Other specified soft tissue disorders: Secondary | ICD-10-CM | POA: Diagnosis not present

## 2015-10-19 DIAGNOSIS — M2550 Pain in unspecified joint: Secondary | ICD-10-CM | POA: Diagnosis not present

## 2015-10-19 DIAGNOSIS — L401 Generalized pustular psoriasis: Secondary | ICD-10-CM | POA: Diagnosis not present

## 2015-10-20 DIAGNOSIS — H3554 Dystrophies primarily involving the retinal pigment epithelium: Secondary | ICD-10-CM | POA: Diagnosis not present

## 2015-11-15 DIAGNOSIS — L4050 Arthropathic psoriasis, unspecified: Secondary | ICD-10-CM | POA: Diagnosis not present

## 2015-11-29 DIAGNOSIS — L4050 Arthropathic psoriasis, unspecified: Secondary | ICD-10-CM | POA: Diagnosis not present

## 2015-12-16 DIAGNOSIS — H3554 Dystrophies primarily involving the retinal pigment epithelium: Secondary | ICD-10-CM | POA: Insufficient documentation

## 2015-12-16 DIAGNOSIS — Z1589 Genetic susceptibility to other disease: Secondary | ICD-10-CM | POA: Insufficient documentation

## 2015-12-16 DIAGNOSIS — L409 Psoriasis, unspecified: Secondary | ICD-10-CM | POA: Diagnosis not present

## 2015-12-16 DIAGNOSIS — H35363 Drusen (degenerative) of macula, bilateral: Secondary | ICD-10-CM | POA: Diagnosis not present

## 2015-12-16 DIAGNOSIS — L4050 Arthropathic psoriasis, unspecified: Secondary | ICD-10-CM | POA: Diagnosis not present

## 2015-12-16 DIAGNOSIS — H26493 Other secondary cataract, bilateral: Secondary | ICD-10-CM | POA: Diagnosis not present

## 2015-12-16 DIAGNOSIS — Z961 Presence of intraocular lens: Secondary | ICD-10-CM | POA: Insufficient documentation

## 2016-01-03 DIAGNOSIS — L4050 Arthropathic psoriasis, unspecified: Secondary | ICD-10-CM | POA: Diagnosis not present

## 2016-01-10 DIAGNOSIS — L4050 Arthropathic psoriasis, unspecified: Secondary | ICD-10-CM | POA: Diagnosis not present

## 2016-01-10 DIAGNOSIS — L401 Generalized pustular psoriasis: Secondary | ICD-10-CM | POA: Diagnosis not present

## 2016-01-12 ENCOUNTER — Encounter: Payer: Medicare Other | Admitting: Internal Medicine

## 2016-01-13 DIAGNOSIS — H26493 Other secondary cataract, bilateral: Secondary | ICD-10-CM | POA: Diagnosis not present

## 2016-01-13 DIAGNOSIS — H1855 Macular corneal dystrophy: Secondary | ICD-10-CM | POA: Diagnosis not present

## 2016-01-13 DIAGNOSIS — H26491 Other secondary cataract, right eye: Secondary | ICD-10-CM | POA: Diagnosis not present

## 2016-01-13 DIAGNOSIS — H26492 Other secondary cataract, left eye: Secondary | ICD-10-CM | POA: Diagnosis not present

## 2016-01-13 DIAGNOSIS — Z961 Presence of intraocular lens: Secondary | ICD-10-CM | POA: Diagnosis not present

## 2016-01-13 DIAGNOSIS — Z79899 Other long term (current) drug therapy: Secondary | ICD-10-CM | POA: Diagnosis not present

## 2016-01-17 ENCOUNTER — Ambulatory Visit (INDEPENDENT_AMBULATORY_CARE_PROVIDER_SITE_OTHER): Payer: Medicare Other | Admitting: Internal Medicine

## 2016-01-17 ENCOUNTER — Encounter: Payer: Self-pay | Admitting: Internal Medicine

## 2016-01-17 VITALS — BP 132/78 | HR 78 | Ht 63.0 in | Wt 201.4 lb

## 2016-01-17 DIAGNOSIS — Z0000 Encounter for general adult medical examination without abnormal findings: Secondary | ICD-10-CM | POA: Diagnosis not present

## 2016-01-17 DIAGNOSIS — Z7689 Persons encountering health services in other specified circumstances: Secondary | ICD-10-CM | POA: Insufficient documentation

## 2016-01-17 NOTE — Patient Instructions (Signed)
Health Maintenance, Female Adopting a healthy lifestyle and getting preventive care can go a long way to promote health and wellness. Talk with your health care provider about what schedule of regular examinations is right for you. This is a good chance for you to check in with your provider about disease prevention and staying healthy. In between checkups, there are plenty of things you can do on your own. Experts have done a lot of research about which lifestyle changes and preventive measures are most likely to keep you healthy. Ask your health care provider for more information. WEIGHT AND DIET  Eat a healthy diet  Be sure to include plenty of vegetables, fruits, low-fat dairy products, and lean protein.  Do not eat a lot of foods high in solid fats, added sugars, or salt.  Get regular exercise. This is one of the most important things you can do for your health.  Most adults should exercise for at least 150 minutes each week. The exercise should increase your heart rate and make you sweat (moderate-intensity exercise).  Most adults should also do strengthening exercises at least twice a week. This is in addition to the moderate-intensity exercise.  Maintain a healthy weight  Body mass index (BMI) is a measurement that can be used to identify possible weight problems. It estimates body fat based on height and weight. Your health care provider can help determine your BMI and help you achieve or maintain a healthy weight.  For females 20 years of age and older:   A BMI below 18.5 is considered underweight.  A BMI of 18.5 to 24.9 is normal.  A BMI of 25 to 29.9 is considered overweight.  A BMI of 30 and above is considered obese.  Watch levels of cholesterol and blood lipids  You should start having your blood tested for lipids and cholesterol at 67 years of age, then have this test every 5 years.  You may need to have your cholesterol levels checked more often if:  Your lipid  or cholesterol levels are high.  You are older than 67 years of age.  You are at high risk for heart disease.  CANCER SCREENING   Lung Cancer  Lung cancer screening is recommended for adults 55-80 years old who are at high risk for lung cancer because of a history of smoking.  A yearly low-dose CT scan of the lungs is recommended for people who:  Currently smoke.  Have quit within the past 15 years.  Have at least a 30-pack-year history of smoking. A pack year is smoking an average of one pack of cigarettes a day for 1 year.  Yearly screening should continue until it has been 15 years since you quit.  Yearly screening should stop if you develop a health problem that would prevent you from having lung cancer treatment.  Breast Cancer  Practice breast self-awareness. This means understanding how your breasts normally appear and feel.  It also means doing regular breast self-exams. Let your health care provider know about any changes, no matter how small.  If you are in your 20s or 30s, you should have a clinical breast exam (CBE) by a health care provider every 1-3 years as part of a regular health exam.  If you are 40 or older, have a CBE every year. Also consider having a breast X-ray (mammogram) every year.  If you have a family history of breast cancer, talk to your health care provider about genetic screening.  If you   are at high risk for breast cancer, talk to your health care provider about having an MRI and a mammogram every year.  Breast cancer gene (BRCA) assessment is recommended for women who have family members with BRCA-related cancers. BRCA-related cancers include:  Breast.  Ovarian.  Tubal.  Peritoneal cancers.  Results of the assessment will determine the need for genetic counseling and BRCA1 and BRCA2 testing. Cervical Cancer Your health care provider may recommend that you be screened regularly for cancer of the pelvic organs (ovaries, uterus, and  vagina). This screening involves a pelvic examination, including checking for microscopic changes to the surface of your cervix (Pap test). You may be encouraged to have this screening done every 3 years, beginning at age 21.  For women ages 30-65, health care providers may recommend pelvic exams and Pap testing every 3 years, or they may recommend the Pap and pelvic exam, combined with testing for human papilloma virus (HPV), every 5 years. Some types of HPV increase your risk of cervical cancer. Testing for HPV may also be done on women of any age with unclear Pap test results.  Other health care providers may not recommend any screening for nonpregnant women who are considered low risk for pelvic cancer and who do not have symptoms. Ask your health care provider if a screening pelvic exam is right for you.  If you have had past treatment for cervical cancer or a condition that could lead to cancer, you need Pap tests and screening for cancer for at least 20 years after your treatment. If Pap tests have been discontinued, your risk factors (such as having a new sexual partner) need to be reassessed to determine if screening should resume. Some women have medical problems that increase the chance of getting cervical cancer. In these cases, your health care provider may recommend more frequent screening and Pap tests. Colorectal Cancer  This type of cancer can be detected and often prevented.  Routine colorectal cancer screening usually begins at 67 years of age and continues through 67 years of age.  Your health care provider may recommend screening at an earlier age if you have risk factors for colon cancer.  Your health care provider may also recommend using home test kits to check for hidden blood in the stool.  A small camera at the end of a tube can be used to examine your colon directly (sigmoidoscopy or colonoscopy). This is done to check for the earliest forms of colorectal  cancer.  Routine screening usually begins at age 50.  Direct examination of the colon should be repeated every 5-10 years through 67 years of age. However, you may need to be screened more often if early forms of precancerous polyps or small growths are found. Skin Cancer  Check your skin from head to toe regularly.  Tell your health care provider about any new moles or changes in moles, especially if there is a change in a mole's shape or color.  Also tell your health care provider if you have a mole that is larger than the size of a pencil eraser.  Always use sunscreen. Apply sunscreen liberally and repeatedly throughout the day.  Protect yourself by wearing long sleeves, pants, a wide-brimmed hat, and sunglasses whenever you are outside. HEART DISEASE, DIABETES, AND HIGH BLOOD PRESSURE   High blood pressure causes heart disease and increases the risk of stroke. High blood pressure is more likely to develop in:  People who have blood pressure in the high end   of the normal range (130-139/85-89 mm Hg).  People who are overweight or obese.  People who are African American.  If you are 38-23 years of age, have your blood pressure checked every 3-5 years. If you are 61 years of age or older, have your blood pressure checked every year. You should have your blood pressure measured twice--once when you are at a hospital or clinic, and once when you are not at a hospital or clinic. Record the average of the two measurements. To check your blood pressure when you are not at a hospital or clinic, you can use:  An automated blood pressure machine at a pharmacy.  A home blood pressure monitor.  If you are between 45 years and 39 years old, ask your health care provider if you should take aspirin to prevent strokes.  Have regular diabetes screenings. This involves taking a blood sample to check your fasting blood sugar level.  If you are at a normal weight and have a low risk for diabetes,  have this test once every three years after 68 years of age.  If you are overweight and have a high risk for diabetes, consider being tested at a younger age or more often. PREVENTING INFECTION  Hepatitis B  If you have a higher risk for hepatitis B, you should be screened for this virus. You are considered at high risk for hepatitis B if:  You were born in a country where hepatitis B is common. Ask your health care provider which countries are considered high risk.  Your parents were born in a high-risk country, and you have not been immunized against hepatitis B (hepatitis B vaccine).  You have HIV or AIDS.  You use needles to inject street drugs.  You live with someone who has hepatitis B.  You have had sex with someone who has hepatitis B.  You get hemodialysis treatment.  You take certain medicines for conditions, including cancer, organ transplantation, and autoimmune conditions. Hepatitis C  Blood testing is recommended for:  Everyone born from [birth year] through 1967.  Anyone with known risk factors for hepatitis C. Sexually transmitted infections (STIs)  You should be screened for sexually transmitted infections (STIs) including gonorrhea and chlamydia if:  You are sexually active and are younger than 67 years of age.  You are older than 67 years of age and your health care provider tells you that you are at risk for this type of infection.  Your sexual activity has changed since you were last screened and you are at an increased risk for chlamydia or gonorrhea. Ask your health care provider if you are at risk.  If you do not have HIV, but are at risk, it may be recommended that you take a prescription medicine daily to prevent HIV infection. This is called pre-exposure prophylaxis (PrEP). You are considered at risk if:  You are sexually active and do not regularly use condoms or know the HIV status of your partner(s).  You take drugs by injection.  You are sexually  active with a partner who has HIV. Talk with your health care provider about whether you are at high risk of being infected with HIV. If you choose to begin PrEP, you should first be tested for HIV. You should then be tested every 3 months for as long as you are taking PrEP.  PREGNANCY   If you are premenopausal and you may become pregnant, ask your health care provider about preconception counseling.  If you may  become pregnant, take 400 to 800 micrograms (mcg) of folic acid every day.  If you want to prevent pregnancy, talk to your health care provider about birth control (contraception). OSTEOPOROSIS AND MENOPAUSE   Osteoporosis is a disease in which the bones lose minerals and strength with aging. This can result in serious bone fractures. Your risk for osteoporosis can be identified using a bone density scan.  If you are 61 years of age or older, or if you are at risk for osteoporosis and fractures, ask your health care provider if you should be screened.  Ask your health care provider whether you should take a calcium or vitamin D supplement to lower your risk for osteoporosis.  Menopause may have certain physical symptoms and risks.  Hormone replacement therapy may reduce some of these symptoms and risks. Talk to your health care provider about whether hormone replacement therapy is right for you.  HOME CARE INSTRUCTIONS   Schedule regular health, dental, and eye exams.  Stay current with your immunizations.   Do not use any tobacco products including cigarettes, chewing tobacco, or electronic cigarettes.  If you are pregnant, do not drink alcohol.  If you are breastfeeding, limit how much and how often you drink alcohol.  Limit alcohol intake to no more than 1 drink per day for nonpregnant women. One drink equals 12 ounces of beer, 5 ounces of wine, or 1 ounces of hard liquor.  Do not use street drugs.  Do not share needles.  Ask your health care provider for help if  you need support or information about quitting drugs.  Tell your health care provider if you often feel depressed.  Tell your health care provider if you have ever been abused or do not feel safe at home.   This information is not intended to replace advice given to you by your health care provider. Make sure you discuss any questions you have with your health care provider.   Document Released: 03/20/2011 Document Revised: 09/25/2014 Document Reviewed: 08/06/2013 Elsevier Interactive Patient Education [employer].

## 2016-01-17 NOTE — Progress Notes (Signed)
Subjective:    Patient ID: Jennifer Patrick, female    DOB: [birth year], 65 y.o.   MRN: [id number]  HPI  65YO female presents for physical exam.  Working with ophalmologist at [facility] with right eye issues. Diagnosed with macular pattern dystrophy.Vision has worsened. Stopped driving. Then, had laser surgery with marked improvement. Now back driving.  Few URIs this year, but this has resolved. No further diarrhea.  Stopped Cosentyx because no improvement in arthritis. Started on Remicaid. Just completed 3rd loading dose.   Wt Readings from Last 3 Encounters:  01/17/16 201 lb 6.4 oz (91.354 kg)  07/14/15 205 lb 8 oz (93.214 kg)  06/16/15 208 lb 4 oz (94.462 kg)   BP Readings from Last 3 Encounters:  01/17/16 132/78  07/14/15 131/72  06/16/15 114/80    Past Medical History  Diagnosis Date  . Psoriasis     Dr. Nehemiah Massed, on embrel  . Arthritis   . Hypertension   . GERD (gastroesophageal reflux disease)   . Cataract   . Anxiety   . Hypothyroidism   . Sleep apnea    Family History  Problem Relation Age of Onset  . Breast cancer Mother   . Sudden death Father     committed suicide  . Diabetes Father   . Lung disease Sister     legionaires  . Lung cancer Mother    Past Surgical History  Procedure Laterality Date  . Abdominal hysterectomy  1999    for abnormal pap, TVH  . Breast biopsy Left 1981  . Thyroidectomy  1976  . Vitrectomy Right 2014  . Cataract extraction Bilateral    Social History   Social History  . Marital Status: Married    Spouse Name: N/A  . Number of Children: 2  . Years of Education: N/A   Occupational History  . retired    Social History Main Topics  . Smoking status: Never Smoker   . Smokeless tobacco: Never Used  . Alcohol Use: Yes     Comment: socially  . Drug Use: No  . Sexual Activity: Not Asked   Other Topics Concern  . None   Social History Narrative   Works at [employer]. Lives in [place].    Review of Systems    Constitutional: Negative for fever, chills, appetite change, fatigue and unexpected weight change.  HENT: Negative for congestion and sinus pressure.   Eyes: Positive for visual disturbance. Negative for pain, redness and itching.  Respiratory: Negative for cough and shortness of breath.   Cardiovascular: Negative for chest pain and leg swelling.  Gastrointestinal: Negative for nausea, vomiting, abdominal pain, diarrhea and constipation.  Genitourinary: Negative for dysuria.  Musculoskeletal: Negative for myalgias and arthralgias.  Skin: Negative for color change and rash.  Hematological: Negative for adenopathy. Does not bruise/bleed easily.  Psychiatric/Behavioral: Negative for suicidal ideas, sleep disturbance and dysphoric mood. The patient is not nervous/anxious.        Objective:    BP 132/78 mmHg  Pulse 78  Ht 5\' 3"  (1.6 m)  Wt 201 lb 6.4 oz (91.354 kg)  BMI 35.69 kg/m2  SpO2 94% Physical Exam  Constitutional: She is oriented to person, place, and time. She appears well-developed and well-nourished. No distress.  HENT:  Head: Normocephalic and atraumatic.  Right Ear: External ear normal.  Left Ear: External ear normal.  Nose: Nose normal.  Mouth/Throat: Oropharynx is clear and moist. No oropharyngeal exudate.  Eyes: Conjunctivae are normal. Pupils are equal, round, and  reactive to light. Right eye exhibits no discharge. Left eye exhibits no discharge. No scleral icterus.  Neck: Normal range of motion. Neck supple. No tracheal deviation present. No thyromegaly present.  Cardiovascular: Normal rate, regular rhythm, normal heart sounds and intact distal pulses.  Exam reveals no gallop and no friction rub.   No murmur heard. Pulmonary/Chest: Effort normal and breath sounds normal. No accessory muscle usage. No tachypnea. No respiratory distress. She has no decreased breath sounds. She has no wheezes. She has no rales. She exhibits no tenderness. Right breast exhibits no  inverted nipple, no mass, no nipple discharge, no skin change and no tenderness. Left breast exhibits no inverted nipple, no mass, no nipple discharge, no skin change and no tenderness. Breasts are symmetrical.  Abdominal: Soft. Bowel sounds are normal. She exhibits no distension and no mass. There is no tenderness. There is no rebound and no guarding.  Musculoskeletal: Normal range of motion. She exhibits no edema or tenderness.  Lymphadenopathy:    She has no cervical adenopathy.  Neurological: She is alert and oriented to person, place, and time. No cranial nerve deficit. She exhibits normal muscle tone. Coordination normal.  Skin: Skin is warm and dry. No rash noted. She is not diaphoretic. No erythema. No pallor.  Psychiatric: She has a normal mood and affect. Her behavior is normal. Judgment and thought content normal.          Assessment & Plan:   Problem List Items Addressed This Visit      Unprioritized   Routine general medical examination at a health care facility - Primary    General medical exam normal today including breast exam. PAP and pelvic deferred as pt s/p hysterectomy. Mammogram ordered. Labs will be performed by rheumatologist. Immunizations are UTD. Colonoscopy UTD. Encouraged healthy diet and exercise.      Relevant Orders   MM Digital Screening       Return in about 6 months (around 07/19/2016) for Recheck.  Ronette Deter, MD Internal Medicine [facility]

## 2016-01-17 NOTE — Assessment & Plan Note (Signed)
General medical exam normal today including breast exam. PAP and pelvic deferred as pt s/p hysterectomy. Mammogram ordered. Labs will be performed by rheumatologist. Immunizations are UTD. Colonoscopy UTD. Encouraged healthy diet and exercise.

## 2016-01-17 NOTE — Progress Notes (Signed)
Pre visit review using our clinic review tool, if applicable. No additional management support is needed unless otherwise documented below in the visit note. 

## 2016-01-24 ENCOUNTER — Other Ambulatory Visit: Payer: Self-pay | Admitting: Internal Medicine

## 2016-01-24 ENCOUNTER — Encounter: Payer: Self-pay | Admitting: Internal Medicine

## 2016-01-24 NOTE — Telephone Encounter (Signed)
Refilled on 07/2015. Please advise?

## 2016-02-15 ENCOUNTER — Ambulatory Visit: Payer: Medicare Other | Attending: Internal Medicine

## 2016-02-28 DIAGNOSIS — L4050 Arthropathic psoriasis, unspecified: Secondary | ICD-10-CM | POA: Diagnosis not present

## 2016-02-28 DIAGNOSIS — Z79899 Other long term (current) drug therapy: Secondary | ICD-10-CM | POA: Diagnosis not present

## 2016-04-10 DIAGNOSIS — L4050 Arthropathic psoriasis, unspecified: Secondary | ICD-10-CM | POA: Diagnosis not present

## 2016-04-10 DIAGNOSIS — L401 Generalized pustular psoriasis: Secondary | ICD-10-CM | POA: Diagnosis not present

## 2016-04-14 DIAGNOSIS — Z9841 Cataract extraction status, right eye: Secondary | ICD-10-CM | POA: Diagnosis not present

## 2016-04-14 DIAGNOSIS — Z9842 Cataract extraction status, left eye: Secondary | ICD-10-CM | POA: Diagnosis not present

## 2016-04-24 DIAGNOSIS — L4050 Arthropathic psoriasis, unspecified: Secondary | ICD-10-CM | POA: Diagnosis not present

## 2016-04-28 ENCOUNTER — Other Ambulatory Visit: Payer: Self-pay

## 2016-04-28 MED ORDER — OMEPRAZOLE 40 MG PO CPDR: 40.0000 mg | DELAYED_RELEASE_CAPSULE | Freq: Every day | ORAL | 0 refills | Status: DC

## 2016-04-28 NOTE — Telephone Encounter (Signed)
Medication refilled

## 2016-06-26 DIAGNOSIS — Z23 Encounter for immunization: Secondary | ICD-10-CM | POA: Diagnosis not present

## 2016-06-26 DIAGNOSIS — L4050 Arthropathic psoriasis, unspecified: Secondary | ICD-10-CM | POA: Diagnosis not present

## 2016-07-19 ENCOUNTER — Ambulatory Visit: Payer: Medicare Other | Admitting: Internal Medicine

## 2016-07-19 ENCOUNTER — Ambulatory Visit (INDEPENDENT_AMBULATORY_CARE_PROVIDER_SITE_OTHER): Payer: Medicare Other | Admitting: Family

## 2016-07-19 ENCOUNTER — Encounter: Payer: Self-pay | Admitting: Family

## 2016-07-19 VITALS — BP 126/76 | HR 77 | Temp 97.7°F | Resp 16 | Ht 63.0 in | Wt 201.8 lb

## 2016-07-19 DIAGNOSIS — L409 Psoriasis, unspecified: Secondary | ICD-10-CM

## 2016-07-19 DIAGNOSIS — F3342 Major depressive disorder, recurrent, in full remission: Secondary | ICD-10-CM

## 2016-07-19 DIAGNOSIS — E039 Hypothyroidism, unspecified: Secondary | ICD-10-CM | POA: Diagnosis not present

## 2016-07-19 DIAGNOSIS — M069 Rheumatoid arthritis, unspecified: Secondary | ICD-10-CM | POA: Insufficient documentation

## 2016-07-19 DIAGNOSIS — K219 Gastro-esophageal reflux disease without esophagitis: Secondary | ICD-10-CM | POA: Insufficient documentation

## 2016-07-19 DIAGNOSIS — H3554 Dystrophies primarily involving the retinal pigment epithelium: Secondary | ICD-10-CM | POA: Diagnosis not present

## 2016-07-19 DIAGNOSIS — Z1211 Encounter for screening for malignant neoplasm of colon: Secondary | ICD-10-CM

## 2016-07-19 DIAGNOSIS — Z7689 Persons encountering health services in other specified circumstances: Secondary | ICD-10-CM | POA: Diagnosis not present

## 2016-07-19 DIAGNOSIS — I10 Essential (primary) hypertension: Secondary | ICD-10-CM

## 2016-07-19 LAB — COMPREHENSIVE METABOLIC PANEL
ALBUMIN: 3.8 g/dL (ref 3.5–5.2)
ALT: 30 U/L (ref 0–35)
AST: 42 U/L — AB (ref 0–37)
Alkaline Phosphatase: 85 U/L (ref 39–117)
BUN: 19 mg/dL (ref 6–23)
CHLORIDE: 102 meq/L (ref 96–112)
CO2: 29 meq/L (ref 19–32)
CREATININE: 1.19 mg/dL (ref 0.40–1.20)
Calcium: 9.2 mg/dL (ref 8.4–10.5)
GFR: 47.99 mL/min — ABNORMAL LOW (ref >60.00)
Glucose, Bld: 119 mg/dL — ABNORMAL HIGH (ref 70–99)
POTASSIUM: 3.9 meq/L (ref 3.5–5.1)
SODIUM: 140 meq/L (ref 135–145)
Total Bilirubin: 0.7 mg/dL (ref 0.2–1.2)
Total Protein: 6.9 g/dL (ref 6.0–8.3)

## 2016-07-19 LAB — TSH: TSH: 1.87 u[IU]/mL (ref 0.35–4.50)

## 2016-07-19 LAB — HEPATITIS C ANTIBODY: HCV AB: NEGATIVE

## 2016-07-19 NOTE — Assessment & Plan Note (Signed)
Reviewed past medical social, and family history with patient. Pending hepatitis C screen which is overdue for patient

## 2016-07-19 NOTE — Assessment & Plan Note (Signed)
Patient has history normal colonoscopy per patient. No strong family history colon cancer. She politely declines colonoscopy however she'll consider [employer]. Order placed for patient.

## 2016-07-19 NOTE — Progress Notes (Signed)
Subjective:    Patient ID: Jennifer Patrick, female    DOB: 1951-07-25, 65 y.o.   MRN: [id number]  CC: Jennifer Patrick is a 65 y.o. female who presents today for follow up.   HPI: Patient here for follow-up and to establish care.  Macular pattern dystrophy- Following with opthalmology at [facility]. Vision changes started in 2009, has been a slow progression. Describes vision as foggy and laser surgery 2017 and now driving again. Right eye vision worse than left.  Hypothyroidism- Stable.  GERD-Controlled on prilosec.  Depression- Started 8 years ago. Stable.  Hypertension- Compliant. No dizziness.  Psorias arthritis- continues on remicade IV every 8 weeks.   She is overdue on health maintenance including colonoscopy, hep C, and mammogram. Had CPE 01/2016. Normal colonoscopy when 53, normal per patient.       HISTORY:  Past Medical History:  Diagnosis Date  . Anxiety   . Arthritis   . Cataract   . GERD (gastroesophageal reflux disease)   . Hypertension   . Hypothyroidism   . Psoriasis    Dr. Nehemiah Massed, on embrel  . Sleep apnea    Past Surgical History:  Procedure Laterality Date  . ABDOMINAL HYSTERECTOMY  1999   for abnormal pap, TVH  . BREAST BIOPSY Left 1981  . CATARACT EXTRACTION Bilateral   . THYROIDECTOMY  1976  . VITRECTOMY Right 2014   Family History  Problem Relation Age of Onset  . Breast cancer Mother   . Lung cancer Mother   . Macular degeneration Mother   . Sudden death Father     committed suicide  . Diabetes Father   . Lung disease Sister     legionaires  . Hepatitis C Brother     Allergies: Penicillins Current Outpatient Prescriptions on File Prior to Visit  Medication Sig Dispense Refill  . atenolol (TENORMIN) 50 MG tablet TAKE 1 TABLET BY MOUTH DAILY 90 tablet 3  . levothyroxine (SYNTHROID, LEVOTHROID) 125 MCG tablet TAKE 1 TABLET (125 MCG TOTAL) BY MOUTH DAILY. 90 tablet 3  . omeprazole (PRILOSEC) 40 MG capsule Take 1 capsule (40 mg  total) by mouth daily. 90 capsule 0  . sertraline (ZOLOFT) 50 MG tablet TAKE 1 TABLET BY MOUTH EVERY DAY 90 tablet 3  . triamterene-hydrochlorothiazide (MAXZIDE-25) 37.5-25 MG tablet TAKE 1 TABLET BY MOUTH DAILY 90 tablet 3   No current facility-administered medications on file prior to visit.     Social History  Substance Use Topics  . Smoking status: Never Smoker  . Smokeless tobacco: Never Used  . Alcohol use Yes     Comment: socially    Review of Systems  Constitutional: Negative for chills and fever.  Eyes: Positive for visual disturbance (chronic).  Respiratory: Negative for cough.   Cardiovascular: Negative for chest pain and palpitations.  Gastrointestinal: Negative for nausea and vomiting.  Psychiatric/Behavioral: Negative for sleep disturbance.      Objective:    BP 126/76   Pulse 77   Temp 97.7 F (36.5 C) (Oral)   Resp 16   Ht 5\' 3"  (1.6 m)   Wt 201 lb 12.8 oz (91.5 kg)   SpO2 97%   BMI 35.75 kg/m  BP Readings from Last 3 Encounters:  07/19/16 126/76  01/17/16 132/78  07/14/15 131/72   Wt Readings from Last 3 Encounters:  07/19/16 201 lb 12.8 oz (91.5 kg)  01/17/16 201 lb 6.4 oz (91.4 kg)  07/14/15 205 lb 8 oz (93.2 kg)    Physical  Exam  Constitutional: She appears well-developed and well-nourished.  Eyes: Conjunctivae are normal.  Cardiovascular: Normal rate, regular rhythm, normal heart sounds and normal pulses.   Pulmonary/Chest: Effort normal and breath sounds normal. She has no wheezes. She has no rhonchi. She has no rales.  Neurological: She is alert.  Skin: Skin is warm and dry.  Psychiatric: She has a normal mood and affect. Her speech is normal and behavior is normal. Thought content normal.  Vitals reviewed.      Assessment & Plan:   Problem List Items Addressed This Visit      Cardiovascular and Mediastinum   Hypertension    Stable at goal. Continue current regimen. Pending CMP      Relevant Orders   Comprehensive metabolic  panel     Digestive   GERD (gastroesophageal reflux disease)    Stable on Prilosec.        Endocrine   Hypothyroidism    Symptomatically stable. Pending TSH      Relevant Orders   TSH     Musculoskeletal and Integument   Psoriasis    Stable. Follows with rheumatology.        Other   Depression    Stable on Zoloft. Continue current regimen.      Screening for colon cancer    Patient has history normal colonoscopy per patient. No strong family history colon cancer. She politely declines colonoscopy however she'll consider [employer]. Order placed for patient.      Encounter to establish care - Primary    Reviewed past medical social, and family history with patient. Pending hepatitis C screen which is overdue for patient      Relevant Orders   Hepatitis C antibody   Macular pattern dystrophy    Other Visit Diagnoses   None.      I have discontinued [name]'s InFLIXimab (REMICADE IV). I am also having her maintain her sertraline, levothyroxine, triamterene-hydrochlorothiazide, atenolol, and omeprazole.   No orders of the defined types were placed in this encounter.   Return precautions given.   Risks, benefits, and alternatives of the medications and treatment plan prescribed today were discussed, and patient expressed understanding.   Education regarding symptom management and diagnosis given to patient on AVS.  Continue to follow with Mable Paris, FNP for routine health maintenance.   Jennifer Patrick and I agreed with plan.   Mable Paris, FNP

## 2016-07-19 NOTE — Assessment & Plan Note (Addendum)
Stable at goal. Continue current regimen. Pending CMP

## 2016-07-19 NOTE — Progress Notes (Signed)
Pre visit review using our clinic review tool, if applicable. No additional management support is needed unless otherwise documented below in the visit note. 

## 2016-07-19 NOTE — Patient Instructions (Addendum)
Cologuard  We placed a referral. Mammogram this year. I asked that you call one the below locations and schedule this when it is convenient for you.   If you have dense breasts, you may ask for 3D mammogram over the traditional 2D mammogram as new evidence suggest 3D is superior. Please note that NOT all insurance companies cover 3D and you may have to pay a higher copay. You may call your insurance company to further clarify your benefits.   Options for [place]  [place], [place]  * [facility] [place] * Note if you ask for 3D mammogram at this location, you must request [place], [place] location*

## 2016-07-19 NOTE — Assessment & Plan Note (Signed)
Symptomatically stable. Pending TSH. 

## 2016-07-19 NOTE — Assessment & Plan Note (Signed)
Stable on Prilosec

## 2016-07-19 NOTE — Assessment & Plan Note (Signed)
Stable on Zoloft. Continue current regimen.

## 2016-07-19 NOTE — Assessment & Plan Note (Signed)
Stable.  Follows with rheumatology. 

## 2016-07-28 ENCOUNTER — Other Ambulatory Visit: Payer: Self-pay | Admitting: Family

## 2016-08-14 DIAGNOSIS — L401 Generalized pustular psoriasis: Secondary | ICD-10-CM | POA: Diagnosis not present

## 2016-08-14 DIAGNOSIS — L4050 Arthropathic psoriasis, unspecified: Secondary | ICD-10-CM | POA: Diagnosis not present

## 2016-08-25 ENCOUNTER — Other Ambulatory Visit: Payer: Self-pay | Admitting: Family

## 2016-08-25 MED ORDER — OMEPRAZOLE 40 MG PO CPDR: 40.0000 mg | DELAYED_RELEASE_CAPSULE | Freq: Every day | ORAL | 3 refills | Status: DC

## 2016-08-25 NOTE — Telephone Encounter (Signed)
Medication has been refilled.

## 2016-08-28 DIAGNOSIS — L4050 Arthropathic psoriasis, unspecified: Secondary | ICD-10-CM | POA: Diagnosis not present

## 2016-10-23 DIAGNOSIS — L4050 Arthropathic psoriasis, unspecified: Secondary | ICD-10-CM | POA: Diagnosis not present

## 2016-10-25 ENCOUNTER — Ambulatory Visit (INDEPENDENT_AMBULATORY_CARE_PROVIDER_SITE_OTHER): Payer: Medicare Other | Admitting: Family

## 2016-10-25 ENCOUNTER — Encounter: Payer: Self-pay | Admitting: Family

## 2016-10-25 VITALS — BP 134/76 | HR 77 | Temp 97.8°F | Ht 63.0 in | Wt 201.6 lb

## 2016-10-25 DIAGNOSIS — N289 Disorder of kidney and ureter, unspecified: Secondary | ICD-10-CM | POA: Insufficient documentation

## 2016-10-25 DIAGNOSIS — J90 Pleural effusion, not elsewhere classified: Secondary | ICD-10-CM | POA: Insufficient documentation

## 2016-10-25 DIAGNOSIS — R748 Abnormal levels of other serum enzymes: Secondary | ICD-10-CM

## 2016-10-25 DIAGNOSIS — R0789 Other chest pain: Secondary | ICD-10-CM

## 2016-10-25 NOTE — Assessment & Plan Note (Signed)
No chest pain, SaO2 97%. Reproducible chest wall tenderness. Improves with ibuprofen, heat. Patient and I jointly agreed most likely musculoskeletal etiology. Due to her decreasing renal function, I advised to stop ibuprofen and to treat with heat, capsaicin, icy hot. She'll let me know if she is not better.

## 2016-10-25 NOTE — Assessment & Plan Note (Signed)
Will follow. Advised to hold NSAIDs. Patient will bring recent labs to office.

## 2016-10-25 NOTE — Patient Instructions (Addendum)
Please bring labs by ; at that point, we will order ultrasond of liver. Suspect fatty liver disease.  As discussed, lifestyle modifications are the treatment for this -  including low trans fat diet, maintaining a healthy weight as well as keeping cholesterol, blood pressure, and diabetes at goal.   I would hold ibuprofen due to kidney function  Suspect musculoskeletal. Trial capsaicin, icy hot, heat and gentle massage.    Let me know if not better.

## 2016-10-25 NOTE — Progress Notes (Signed)
Pre visit review using our clinic review tool, if applicable. No additional management support is needed unless otherwise documented below in the visit note. 

## 2016-10-25 NOTE — Assessment & Plan Note (Addendum)
Discussed lifestyle modification of probable fatty liver. Declined [place] RUQ today and wants to drop off labs first.

## 2016-10-25 NOTE — Progress Notes (Signed)
Subjective:    Patient ID: Jennifer Patrick, female    DOB: 1950-11-11, 66 y.o.   MRN: [id number]  CC: Jennifer Patrick is a 66 y.o. female who presents today for follow up.   HPI:  CC: Right rib pain after fall 2 weeks ago, waxing and waning. Fall occurred when going to answer phone and one foot got tangled in pant leg of pajamas onto vinyl floor. Didn't hit head or LOC. One week after that had been carrying boxes. Hurts to take a deep breath. taking ibuprofen with relief. Pain improves with heat in car seat. No bruising.  No pain when sitting still. Painful to the touch. Denies exertional chest pain or pressure, numbness or tingling radiating to left arm or jaw, palpitations, dizziness, frequent headaches, changes in vision, or shortness of breath.   Liver function enzymes had been elevated, kidney function had been decreased 4 months ago. Thinks years ago told she had fatty liver disease.   Had labs done yesterday and would prefer to use them.         HISTORY:  Past Medical History:  Diagnosis Date  . Anxiety   . Arthritis   . Cataract   . GERD (gastroesophageal reflux disease)   . Hypertension   . Hypothyroidism   . Psoriasis    Dr. Nehemiah Massed, on embrel  . Sleep apnea    Past Surgical History:  Procedure Laterality Date  . ABDOMINAL HYSTERECTOMY  1999   for abnormal pap, TVH  . BREAST BIOPSY Left 1981  . CATARACT EXTRACTION Bilateral   . THYROIDECTOMY  1976  . VITRECTOMY Right 2014   Family History  Problem Relation Age of Onset  . Breast cancer Mother   . Lung cancer Mother   . Macular degeneration Mother   . Sudden death Father     committed suicide  . Diabetes Father   . Lung disease Sister     legionaires  . Hepatitis C Brother     Allergies: Penicillins Current Outpatient Prescriptions on File Prior to Visit  Medication Sig Dispense Refill  . atenolol (TENORMIN) 50 MG tablet TAKE 1 TABLET BY MOUTH DAILY 90 tablet 3  . levothyroxine (SYNTHROID,  LEVOTHROID) 125 MCG tablet TAKE 1 TABLET (125 MCG TOTAL) BY MOUTH DAILY. 90 tablet 3  . omeprazole (PRILOSEC) 40 MG capsule Take 1 capsule (40 mg total) by mouth daily. 90 capsule 3  . sertraline (ZOLOFT) 50 MG tablet TAKE 1 TABLET BY MOUTH EVERY DAY 90 tablet 3  . triamterene-hydrochlorothiazide (MAXZIDE-25) 37.5-25 MG tablet TAKE 1 TABLET BY MOUTH DAILY 90 tablet 3   No current facility-administered medications on file prior to visit.     Social History  Substance Use Topics  . Smoking status: Never Smoker  . Smokeless tobacco: Never Used  . Alcohol use Yes     Comment: socially    Review of Systems  Constitutional: Negative for chills and fever.  Respiratory: Negative for cough, shortness of breath and wheezing.   Cardiovascular: Negative for chest pain and palpitations.  Gastrointestinal: Negative for nausea and vomiting.      Objective:    BP 134/76   Pulse 77   Temp 97.8 F (36.6 C) (Oral)   Ht 5\' 3"  (1.6 m)   Wt 201 lb 9.6 oz (91.4 kg)   SpO2 97%   BMI 35.71 kg/m  BP Readings from Last 3 Encounters:  10/25/16 134/76  07/19/16 126/76  01/17/16 132/78   Wt Readings from Last  3 Encounters:  10/25/16 201 lb 9.6 oz (91.4 kg)  07/19/16 201 lb 12.8 oz (91.5 kg)  01/17/16 201 lb 6.4 oz (91.4 kg)    Physical Exam  Constitutional: She appears well-developed and well-nourished.  Eyes: Conjunctivae are normal.  Cardiovascular: Normal rate, regular rhythm, normal heart sounds and normal pulses.   Pulmonary/Chest: Effort normal and breath sounds normal. She has no wheezes. She has no rhonchi. She has no rales.     Chest wall is not dull to percussion. She exhibits tenderness. She exhibits no mass, no bony tenderness, no edema and no deformity.  Reproducible chest wall tenderness as noted on diagram. Pain noted with deep inspiration. No gross deformity.  Neurological: She is alert.  Skin: Skin is warm and dry.  Psychiatric: She has a normal mood and affect. Her speech  is normal and behavior is normal. Thought content normal.  Vitals reviewed.      Assessment & Plan:   Problem List Items Addressed This Visit      Other   Chest wall tenderness - Primary    No chest pain, SaO2 97%. Reproducible chest wall tenderness. Improves with ibuprofen, heat. Patient and I jointly agreed most likely musculoskeletal etiology. Due to her decreasing renal function, I advised to stop ibuprofen and to treat with heat, capsaicin, icy hot. She'll let me know if she is not better.      Elevated liver enzymes    Discussed lifestyle modification of probable fatty liver. Declined [place] RUQ today and wants to drop off labs first.       Decreased renal function    Will follow. Advised to hold NSAIDs. Patient will bring recent labs to office.           I am having Ms. Azar maintain her sertraline, levothyroxine, triamterene-hydrochlorothiazide, atenolol, omeprazole, and inFLIXimab in sodium chloride 0.9 %.   Meds ordered this encounter  Medications  . inFLIXimab in sodium chloride 0.9 %    Sig: Inject into the vein.    Return precautions given.   Risks, benefits, and alternatives of the medications and treatment plan prescribed today were discussed, and patient expressed understanding.   Education regarding symptom management and diagnosis given to patient on AVS.  Continue to follow with Mable Paris, FNP for routine health maintenance.   Jennifer Patrick and I agreed with plan.   Mable Paris, FNP

## 2016-11-08 ENCOUNTER — Ambulatory Visit (INDEPENDENT_AMBULATORY_CARE_PROVIDER_SITE_OTHER): Payer: Medicare Other

## 2016-11-08 ENCOUNTER — Ambulatory Visit (INDEPENDENT_AMBULATORY_CARE_PROVIDER_SITE_OTHER): Payer: Medicare Other | Admitting: Family

## 2016-11-08 ENCOUNTER — Telehealth: Payer: Self-pay | Admitting: Radiology

## 2016-11-08 ENCOUNTER — Telehealth: Payer: Self-pay | Admitting: Family

## 2016-11-08 ENCOUNTER — Encounter: Payer: Self-pay | Admitting: Family

## 2016-11-08 VITALS — BP 114/84 | HR 87 | Temp 98.0°F | Ht 63.0 in | Wt 197.8 lb

## 2016-11-08 DIAGNOSIS — S299XXA Unspecified injury of thorax, initial encounter: Secondary | ICD-10-CM | POA: Diagnosis not present

## 2016-11-08 DIAGNOSIS — R0789 Other chest pain: Secondary | ICD-10-CM

## 2016-11-08 DIAGNOSIS — J90 Pleural effusion, not elsewhere classified: Secondary | ICD-10-CM

## 2016-11-08 LAB — COMPREHENSIVE METABOLIC PANEL
ALBUMIN: 3.8 g/dL (ref 3.5–5.2)
ALK PHOS: 91 U/L (ref 39–117)
ALT: 20 U/L (ref 0–35)
AST: 31 U/L (ref 0–37)
BUN: 14 mg/dL (ref 6–23)
CO2: 31 mEq/L (ref 19–32)
Calcium: 9.1 mg/dL (ref 8.4–10.5)
Chloride: 100 mEq/L (ref 96–112)
Creatinine, Ser: 0.92 mg/dL (ref 0.40–1.20)
GFR: 64.53 mL/min (ref >60.00)
Glucose, Bld: 81 mg/dL (ref 70–99)
POTASSIUM: 3.4 meq/L — AB (ref 3.5–5.1)
SODIUM: 137 meq/L (ref 135–145)
TOTAL PROTEIN: 7.8 g/dL (ref 6.0–8.3)
Total Bilirubin: 0.6 mg/dL (ref 0.2–1.2)

## 2016-11-08 LAB — CBC WITH DIFFERENTIAL/PLATELET
Basophils Absolute: 0.1 10*3/uL (ref 0.0–0.1)
Basophils Relative: 1 % (ref 0.0–3.0)
EOS PCT: 3.5 % (ref 0.0–5.0)
Eosinophils Absolute: 0.4 10*3/uL (ref 0.0–0.7)
HEMATOCRIT: 41.5 % (ref 36.0–46.0)
HEMOGLOBIN: 13.5 g/dL (ref 12.0–15.0)
LYMPHS PCT: 42.9 % (ref 12.0–46.0)
Lymphs Abs: 4.5 10*3/uL — ABNORMAL HIGH (ref 0.7–4.0)
MCHC: 32.6 g/dL (ref 30.0–36.0)
MCV: 83.2 fl (ref 78.0–100.0)
MONO ABS: 1 10*3/uL (ref 0.1–1.0)
MONOS PCT: 9.3 % (ref 3.0–12.0)
Neutro Abs: 4.5 10*3/uL (ref 1.4–7.7)
Neutrophils Relative %: 43.3 % (ref 43.0–77.0)
Platelets: 271 10*3/uL (ref 150.0–400.0)
RBC: 4.99 Mil/uL (ref 3.87–5.11)
RDW: 14.9 % (ref 11.5–15.5)
WBC: 10.4 10*3/uL (ref 4.0–10.5)

## 2016-11-08 LAB — HEPATIC FUNCTION PANEL
ALT: 20 U/L (ref 0–35)
AST: 31 U/L (ref 0–37)
Albumin: 3.8 g/dL (ref 3.5–5.2)
Alkaline Phosphatase: 91 U/L (ref 39–117)
BILIRUBIN TOTAL: 0.6 mg/dL (ref 0.2–1.2)
Bilirubin, Direct: 0.2 mg/dL (ref 0.0–0.3)
Total Protein: 7.8 g/dL (ref 6.0–8.3)

## 2016-11-08 NOTE — Telephone Encounter (Signed)
Pt couldn't obtain urine sample, so she will being specimen back in for POC urinalysis dipstick.

## 2016-11-08 NOTE — Telephone Encounter (Signed)
Did we not get urine on her?  Would you have pt drop off?

## 2016-11-08 NOTE — Telephone Encounter (Signed)
Call pt  Small right pleural effusion  We need to do ct chest for more info- ordered for late this afternoon or tomorrow.

## 2016-11-08 NOTE — Assessment & Plan Note (Addendum)
Worsening.Nonspecific at this time. Pain is mostly associated with deep inspiration. No pain when still or other systemic features. No murphy's sign or CVA tenderness. Pending UA, labs to further evaluate for alternative diagnoses.

## 2016-11-08 NOTE — Telephone Encounter (Signed)
STAT chest CT order updated per Joycelyn Schmid. Melissa will call pt once scheduled.

## 2016-11-08 NOTE — Progress Notes (Signed)
Subjective:    Patient ID: Jennifer Patrick, female    DOB: 02/22/1951, 66 y.o.   MRN: [id number]  CC: Jennifer Patrick is a 66 y.o. female who presents today for follow up.   HPI:  Continues to have right posterior chest wall pain, worsening. Stabbing pain; generally doesn't radiates to right shoulder. Mild pain sitting here, with a deep breath rates 9/10.  She was seen 3 weeks ago after a fall 2 weeks prior.Trial of capsaicin, heat has not resulting in improved tenderness.   No h/o kidney stones.  No dysuria, frequency, nausea, vomiting, fever. No pain with foods.   H/o gerd and well controlled with omeprazole.        HISTORY:  Past Medical History:  Diagnosis Date  . Anxiety   . Arthritis   . Cataract   . GERD (gastroesophageal reflux disease)   . Hypertension   . Hypothyroidism   . Psoriasis    Dr. Nehemiah Massed, on embrel  . Sleep apnea    Past Surgical History:  Procedure Laterality Date  . ABDOMINAL HYSTERECTOMY  1999   for abnormal pap, TVH  . BREAST BIOPSY Left 1981  . CATARACT EXTRACTION Bilateral   . THYROIDECTOMY  1976  . VITRECTOMY Right 2014   Family History  Problem Relation Age of Onset  . Breast cancer Mother   . Lung cancer Mother   . Macular degeneration Mother   . Sudden death Father     committed suicide  . Diabetes Father   . Lung disease Sister     legionaires  . Hepatitis C Brother     Allergies: Penicillins Current Outpatient Prescriptions on File Prior to Visit  Medication Sig Dispense Refill  . atenolol (TENORMIN) 50 MG tablet TAKE 1 TABLET BY MOUTH DAILY 90 tablet 3  . inFLIXimab in sodium chloride 0.9 % Inject into the vein.    Marland Kitchen levothyroxine (SYNTHROID, LEVOTHROID) 125 MCG tablet TAKE 1 TABLET (125 MCG TOTAL) BY MOUTH DAILY. 90 tablet 3  . omeprazole (PRILOSEC) 40 MG capsule Take 1 capsule (40 mg total) by mouth daily. 90 capsule 3  . sertraline (ZOLOFT) 50 MG tablet TAKE 1 TABLET BY MOUTH EVERY DAY 90 tablet 3  .  triamterene-hydrochlorothiazide (MAXZIDE-25) 37.5-25 MG tablet TAKE 1 TABLET BY MOUTH DAILY 90 tablet 3   No current facility-administered medications on file prior to visit.     Social History  Substance Use Topics  . Smoking status: Never Smoker  . Smokeless tobacco: Never Used  . Alcohol use Yes     Comment: socially    Review of Systems  Constitutional: Negative for chills and fever.  Respiratory: Negative for cough, chest tightness, shortness of breath and wheezing.   Cardiovascular: Negative for chest pain and palpitations.  Gastrointestinal: Negative for abdominal distention, abdominal pain, constipation, nausea and vomiting.  Genitourinary: Negative for dysuria and flank pain.  Musculoskeletal: Negative for back pain.      Objective:    BP 114/84   Pulse 87   Temp 98 F (36.7 C) (Oral)   Ht 5\' 3"  (1.6 m)   Wt 197 lb 12.8 oz (89.7 kg)   SpO2 94%   BMI 35.04 kg/m  BP Readings from Last 3 Encounters:  11/08/16 114/84  10/25/16 134/76  07/19/16 126/76   Wt Readings from Last 3 Encounters:  11/08/16 197 lb 12.8 oz (89.7 kg)  10/25/16 201 lb 9.6 oz (91.4 kg)  07/19/16 201 lb 12.8 oz (91.5 kg)  Physical Exam  Constitutional: She appears well-developed and well-nourished.  Eyes: Conjunctivae are normal.  Cardiovascular: Normal rate, regular rhythm, normal heart sounds and normal pulses.   Pulmonary/Chest: Effort normal and breath sounds normal. She has no wheezes. She has no rhonchi. She has no rales.  Abdominal: Soft. Normal appearance and bowel sounds are normal. She exhibits no distension, no fluid wave, no ascites and no mass. There is no tenderness. There is no rigidity, no rebound, no guarding and no CVA tenderness.  Musculoskeletal:       Arms: Area of pain as described by patient  Neurological: She is alert.  Skin: Skin is warm and dry. No rash noted.  Psychiatric: She has a normal mood and affect. Her speech is normal and behavior is normal. Thought  content normal.  Vitals reviewed.      Assessment & Plan:   Problem List Items Addressed This Visit      Other   Chest wall tenderness - Primary    Nonspecific at this time. Pain is mostly associated with deep inspiration. No pain when still or other systemic features. No murphy's sign or CVA tenderness. Pending UA, labs to further evaluate for alternative diagnoses.       Relevant Orders   DG Ribs Unilateral Right   CBC with Differential/Platelet   Comprehensive metabolic panel   Hepatic function panel   POCT urinalysis dipstick       I am having Ms. Schlie maintain her sertraline, levothyroxine, triamterene-hydrochlorothiazide, atenolol, omeprazole, and inFLIXimab in sodium chloride 0.9 %.   No orders of the defined types were placed in this encounter.   Return precautions given.   Risks, benefits, and alternatives of the medications and treatment plan prescribed today were discussed, and patient expressed understanding.   Education regarding symptom management and diagnosis given to patient on AVS.  Continue to follow with Mable Paris, FNP for routine health maintenance.   Jennifer Patrick and I agreed with plan.   Mable Paris, FNP

## 2016-11-08 NOTE — Progress Notes (Signed)
Pre visit review using our clinic review tool, if applicable. No additional management support is needed unless otherwise documented below in the visit note. 

## 2016-11-08 NOTE — Patient Instructions (Signed)
Labs  Xray If labs, xray, unrevealing, we will consider CT renal stone study  If there is no improvement in your symptoms, or if there is any worsening of symptoms, or if you have any additional concerns, please return for re-evaluation; or, if we are closed, consider going to the Emergency Room for evaluation if symptoms urgent.

## 2016-11-09 ENCOUNTER — Ambulatory Visit
Admission: RE | Admit: 2016-11-09 | Discharge: 2016-11-09 | Disposition: A | Discharge status: Home / Self Care | Payer: Medicare Other | Source: Ambulatory Visit | Attending: Family | Admitting: Family

## 2016-11-09 DIAGNOSIS — K449 Diaphragmatic hernia without obstruction or gangrene: Secondary | ICD-10-CM | POA: Insufficient documentation

## 2016-11-09 DIAGNOSIS — I2510 Atherosclerotic heart disease of native coronary artery without angina pectoris: Secondary | ICD-10-CM | POA: Insufficient documentation

## 2016-11-09 DIAGNOSIS — J9811 Atelectasis: Secondary | ICD-10-CM | POA: Diagnosis not present

## 2016-11-09 DIAGNOSIS — J90 Pleural effusion, not elsewhere classified: Secondary | ICD-10-CM

## 2016-11-09 NOTE — Telephone Encounter (Signed)
Patient was unable to give sample.  Patient was given UA cup and instructed to bring it in.

## 2016-11-10 ENCOUNTER — Telehealth: Payer: Self-pay | Admitting: Family

## 2016-11-10 DIAGNOSIS — E876 Hypokalemia: Secondary | ICD-10-CM

## 2016-11-10 DIAGNOSIS — J90 Pleural effusion, not elsewhere classified: Secondary | ICD-10-CM

## 2016-11-10 NOTE — Telephone Encounter (Signed)
Discussed results of CT chest and labs.  Patient felt comfortable managing pain with NSAIDs Referral to pulmonology.  Repeat bmp next week- suspect triamterence-HCTZ cause of hypokalemia.

## 2016-11-13 NOTE — Telephone Encounter (Signed)
It looks like the pt is schedule with Dr. Juanell Fairly on 3/26. Will that be ok?

## 2016-11-14 NOTE — Telephone Encounter (Signed)
I believe so.   Patient's pain controlled.

## 2016-11-20 NOTE — Telephone Encounter (Signed)
Spoke with pt and ashe stated that she is doing much better. She stated that she is not hurting as bad in her ribs and she isn't having as much difficulty taking a deep breath. The also stated that she is not having any urinary symptoms and that is why she hasn't brought back the urine sample.

## 2016-11-20 NOTE — Telephone Encounter (Signed)
Call pt-  See how she is doing. I know she sees pulmonology in a couple of weeks.  Im going to cancel urine order unless patient is still  Having symptoms.   I still would like her to come by for repeat labs for slightly low potassium - pended and she can make lab appt.

## 2016-11-21 ENCOUNTER — Other Ambulatory Visit (INDEPENDENT_AMBULATORY_CARE_PROVIDER_SITE_OTHER): Payer: Medicare Other

## 2016-11-21 DIAGNOSIS — E876 Hypokalemia: Secondary | ICD-10-CM

## 2016-11-21 LAB — BASIC METABOLIC PANEL
BUN: 12 mg/dL (ref 6–23)
CHLORIDE: 104 meq/L (ref 96–112)
CO2: 28 mEq/L (ref 19–32)
Calcium: 8.9 mg/dL (ref 8.4–10.5)
Creatinine, Ser: 0.99 mg/dL (ref 0.40–1.20)
GFR: 59.29 mL/min — AB (ref >60.00)
Glucose, Bld: 153 mg/dL — ABNORMAL HIGH (ref 70–99)
POTASSIUM: 3.5 meq/L (ref 3.5–5.1)
SODIUM: 139 meq/L (ref 135–145)

## 2016-12-04 DIAGNOSIS — Z1212 Encounter for screening for malignant neoplasm of rectum: Secondary | ICD-10-CM | POA: Diagnosis not present

## 2016-12-04 DIAGNOSIS — Z1211 Encounter for screening for malignant neoplasm of colon: Secondary | ICD-10-CM | POA: Diagnosis not present

## 2016-12-07 LAB — COLOGUARD: Cologuard: NEGATIVE

## 2016-12-10 NOTE — Progress Notes (Signed)
[facility] Consultation      Assessment and Plan:  The patient is a 66 year old female who experienced a fall approximately 2 months ago, she then had symptoms of right chest pain and a CT of the chest showed a pleural effusion.  Pleurisy with Right pleural effusion.  -Likely due to recent trauma, appears to be resolving. -We will check a two-view chest x-ray to ensure that the effusion is not getting worse. If this is stable. She can follow up on a as needed basis. -She is asked to call us back if the she has any worsening issues with dyspnea or pain with breathing.   Elevated right diaphragm.  -Likely secondary to above, no need for any intervention at this time.    Date: 12/10/2016  MRN# [id number] Jennifer Patrick 09-01-1951  Referring Physician: [facility].   Jennifer Patrick is a 66 y.o. old female seen in consultation for chief complaint of:    Chief Complaint  Patient presents with  . Advice Only    ref by [facility]: had pleural effusion with SOB; no cough, chest tightness/pain:     HPI:   The patient is a 66 yo female with right rib pain after a fall. The fall occurred at the end of January, and 2 weeks later she began to develop sharp pain on the right side which was worsening. She also noted pain on the right side with deep breaths or laying on right side. No pain when sitting still. Painful to the touch.   Currently notes that the pain is greatly improved but she does notice occasionally with moving a certain way.   Review of images; CT chest 11/09/16; small right pleural effusion, elevated right diaphragm.    PMHX:   Past Medical History:  Diagnosis Date  . Anxiety   . Arthritis   . Cataract   . GERD (gastroesophageal reflux disease)   . Hypertension   . Hypothyroidism   . Psoriasis    Dr. Nehemiah Massed, on embrel  . Sleep apnea    Surgical Hx:  Past Surgical History:  Procedure Laterality Date  . ABDOMINAL HYSTERECTOMY   1999   for abnormal pap, TVH  . BREAST BIOPSY Left 1981  . CATARACT EXTRACTION Bilateral   . THYROIDECTOMY  1976  . VITRECTOMY Right 2014   Family Hx:  Family History  Problem Relation Age of Onset  . Breast cancer Mother   . Lung cancer Mother   . Macular degeneration Mother   . Sudden death Father     committed suicide  . Diabetes Father   . Lung disease Sister     legionaires  . Hepatitis C Brother    Social Hx:   Social History  Substance Use Topics  . Smoking status: Never Smoker  . Smokeless tobacco: Never Used  . Alcohol use Yes     Comment: socially   Medication:   Reviewed.    Allergies:  Penicillins  Review of Systems: Gen:  Denies  fever, sweats, chills HEENT: Denies blurred vision, double vision. bleeds, sore throat Cvc:  No dizziness, chest pain. Resp:   Denies cough or sputum production, shortness of breath Gi: Denies swallowing difficulty, stomach pain. Gu:  Denies bladder incontinence, burning urine Ext:   No Joint pain, stiffness. Skin: No skin rash,  hives  Endoc:  No polyuria, polydipsia. Psych: No depression, insomnia. Other:  All other systems were reviewed with the patient and were negative other that what is  mentioned in the HPI.   Physical Examination:   VS: BP 118/68 (BP Location: Left Arm, Cuff Size: Normal)   Pulse 63   Wt 196 lb (88.9 kg)   SpO2 98%   BMI 34.72 kg/m   General Appearance: No distress  Neuro:without focal findings,  speech normal,  HEENT: PERRLA, EOM intact.   Pulmonary: normal breath sounds, No wheezing. On palpation of the chest wall. There is pinpoint tenderness at the posterior axillary line at approximately the eighth to ninth ribs. CardiovascularNormal S1,S2.  No m/r/g.   Abdomen: Benign, Soft, non-tender. Renal:  No costovertebral tenderness  GU:  No performed at this time. Endoc: No evident thyromegaly, no signs of acromegaly. Skin:   warm, no rashes, no ecchymosis  Extremities: normal, no cyanosis,  clubbing.  Other findings:    LABORATORY PANEL:   CBC No results for input(s): WBC, HGB, HCT, PLT in the last 168 hours. ------------------------------------------------------------------------------------------------------------------  Chemistries  No results for input(s): NA, K, CL, CO2, GLUCOSE, BUN, CREATININE, CALCIUM, MG, AST, ALT, ALKPHOS, BILITOT in the last 168 hours.  Invalid input(s): GFRCGP ------------------------------------------------------------------------------------------------------------------  Cardiac Enzymes No results for input(s): TROPONINI in the last 168 hours. ------------------------------------------------------------  RADIOLOGY:  No results found.     Thank  you for the consultation and for allowing [facility] to assist in the care of your patient. Our recommendations are noted above.  Please contact us if we can be of further service.   Marda Stalker, MD.  Board Certified in Internal Medicine, Pulmonary Medicine, [place], and Sleep Medicine.  [facility] Office Number: [phone]  Patricia Pesa, M.D.  Merton Border, M.D  12/10/2016

## 2016-12-11 ENCOUNTER — Ambulatory Visit (INDEPENDENT_AMBULATORY_CARE_PROVIDER_SITE_OTHER): Payer: Medicare Other | Admitting: Internal Medicine

## 2016-12-11 ENCOUNTER — Encounter: Payer: Self-pay | Admitting: Internal Medicine

## 2016-12-11 VITALS — BP 118/68 | HR 63 | Wt 196.0 lb

## 2016-12-11 DIAGNOSIS — J90 Pleural effusion, not elsewhere classified: Secondary | ICD-10-CM

## 2016-12-11 NOTE — Addendum Note (Signed)
Addended by: Oscar La R on: 12/11/2016 11:05 AM   Modules accepted: Orders

## 2016-12-11 NOTE — Patient Instructions (Addendum)
--  You have a condition called pleurisy with a pleural effusion. This is resolving on its own.   --Will check a CXR 2 view to ensure the pleural effusion is resolving. We will follow up with you by telephone.   --Call back if symptoms recur for a follow up visit.

## 2016-12-12 DIAGNOSIS — L4050 Arthropathic psoriasis, unspecified: Secondary | ICD-10-CM | POA: Diagnosis not present

## 2016-12-12 DIAGNOSIS — R7989 Other specified abnormal findings of blood chemistry: Secondary | ICD-10-CM | POA: Diagnosis not present

## 2016-12-12 DIAGNOSIS — E669 Obesity, unspecified: Secondary | ICD-10-CM | POA: Diagnosis not present

## 2016-12-12 DIAGNOSIS — L401 Generalized pustular psoriasis: Secondary | ICD-10-CM | POA: Diagnosis not present

## 2016-12-12 DIAGNOSIS — Z6834 Body mass index (BMI) 34.0-34.9, adult: Secondary | ICD-10-CM | POA: Diagnosis not present

## 2016-12-26 DIAGNOSIS — L4050 Arthropathic psoriasis, unspecified: Secondary | ICD-10-CM | POA: Diagnosis not present

## 2016-12-26 DIAGNOSIS — Z79899 Other long term (current) drug therapy: Secondary | ICD-10-CM | POA: Diagnosis not present

## 2016-12-28 ENCOUNTER — Encounter: Payer: Self-pay | Admitting: Family

## 2016-12-28 ENCOUNTER — Ambulatory Visit (INDEPENDENT_AMBULATORY_CARE_PROVIDER_SITE_OTHER): Payer: Medicare Other

## 2016-12-28 ENCOUNTER — Ambulatory Visit (INDEPENDENT_AMBULATORY_CARE_PROVIDER_SITE_OTHER): Payer: Medicare Other | Admitting: Family

## 2016-12-28 VITALS — BP 130/70 | HR 75 | Temp 98.0°F | Ht 63.0 in | Wt 194.8 lb

## 2016-12-28 DIAGNOSIS — R0789 Other chest pain: Secondary | ICD-10-CM

## 2016-12-28 DIAGNOSIS — J90 Pleural effusion, not elsewhere classified: Secondary | ICD-10-CM

## 2016-12-28 NOTE — Progress Notes (Signed)
Pre visit review using our clinic review tool, if applicable. No additional management support is needed unless otherwise documented below in the visit note. 

## 2016-12-28 NOTE — Progress Notes (Addendum)
Subjective:    Patient ID: Jennifer Patrick, female    DOB: Jun 17, 1951, 66 y.o.   MRN: [id number]  CC: [name] is a 66 y.o. female who presents today for follow up.   HPI: Right pleural effusion ; has seen pulmonology and asked repeat CXR. Here today as would like CXR.   No fever, CP, SOB, cough. Noticed yesterday that had chest wall pain again on right side.  Resolved once 'got up and moving'. Pain pain today.     HISTORY:  Past Medical History:  Diagnosis Date  . Anxiety   . Arthritis   . Cataract   . GERD (gastroesophageal reflux disease)   . Hypertension   . Hypothyroidism   . Psoriasis    Dr. Nehemiah Massed, on embrel  . Sleep apnea    Past Surgical History:  Procedure Laterality Date  . ABDOMINAL HYSTERECTOMY  1999   for abnormal pap, TVH  . BREAST BIOPSY Left 1981  . CATARACT EXTRACTION Bilateral   . THYROIDECTOMY  1976  . VITRECTOMY Right 2014   Family History  Problem Relation Age of Onset  . Breast cancer Mother   . Lung cancer Mother   . Macular degeneration Mother   . Sudden death Father     committed suicide  . Diabetes Father   . Lung disease Sister     legionaires  . Hepatitis C Brother     Allergies: Penicillins Current Outpatient Prescriptions on File Prior to Visit  Medication Sig Dispense Refill  . atenolol (TENORMIN) 50 MG tablet TAKE 1 TABLET BY MOUTH DAILY 90 tablet 3  . inFLIXimab in sodium chloride 0.9 % Inject into the vein.    Marland Kitchen levothyroxine (SYNTHROID, LEVOTHROID) 125 MCG tablet TAKE 1 TABLET (125 MCG TOTAL) BY MOUTH DAILY. 90 tablet 3  . omeprazole (PRILOSEC) 40 MG capsule Take 1 capsule (40 mg total) by mouth daily. 90 capsule 3  . sertraline (ZOLOFT) 50 MG tablet TAKE 1 TABLET BY MOUTH EVERY DAY 90 tablet 3  . triamterene-hydrochlorothiazide (MAXZIDE-25) 37.5-25 MG tablet TAKE 1 TABLET BY MOUTH DAILY 90 tablet 3   No current facility-administered medications on file prior to visit.     Social History  Substance Use  Topics  . Smoking status: Never Smoker  . Smokeless tobacco: Never Used  . Alcohol use Yes     Comment: socially    Review of Systems  Constitutional: Negative for chills and fever.  Respiratory: Negative for cough, shortness of breath and wheezing.   Cardiovascular: Negative for chest pain and palpitations.  Gastrointestinal: Negative for nausea and vomiting.      Objective:    BP 130/70   Pulse 75   Temp 98 F (36.7 C) (Oral)   Ht 5\' 3"  (1.6 m)   Wt 194 lb 12.8 oz (88.4 kg)   SpO2 96%   BMI 34.51 kg/m  BP Readings from Last 3 Encounters:  12/28/16 130/70  12/11/16 118/68  11/08/16 114/84   Wt Readings from Last 3 Encounters:  12/28/16 194 lb 12.8 oz (88.4 kg)  12/11/16 196 lb (88.9 kg)  11/08/16 197 lb 12.8 oz (89.7 kg)    Physical Exam  Constitutional: She appears well-developed and well-nourished.  Eyes: Conjunctivae are normal.  Cardiovascular: Normal rate, regular rhythm, normal heart sounds and normal pulses.   Pulmonary/Chest: Effort normal and breath sounds normal. She has no wheezes. She has no rhonchi. She has no rales. She exhibits no tenderness and no bony tenderness.  No pain with inspiration    Neurological: She is alert.  Skin: Skin is warm and dry.  No rash  Psychiatric: She has a normal mood and affect. Her speech is normal and behavior is normal. Thought content normal.  Vitals reviewed.      Assessment & Plan:   Problem List Items Addressed This Visit      Respiratory   Pleural effusion - Primary    Asymptomatic today. No more chest wall pain or chest wall tenderness. Repeat CXR today. Will continue to follow with pulmonology.          I am having Ms. Bohlken maintain her sertraline, levothyroxine, triamterene-hydrochlorothiazide, atenolol, omeprazole, and inFLIXimab in sodium chloride 0.9 %.   No orders of the defined types were placed in this encounter.   Return precautions given.   Risks, benefits, and alternatives of  the medications and treatment plan prescribed today were discussed, and patient expressed understanding.   Education regarding symptom management and diagnosis given to patient on AVS.  Continue to follow with Mable Paris, FNP for routine health maintenance.   Jennifer Patrick and I agreed with plan.   Mable Paris, FNP

## 2016-12-28 NOTE — Assessment & Plan Note (Signed)
Asymptomatic today. No more chest wall pain or chest wall tenderness. Repeat CXR today. Will continue to follow with pulmonology.

## 2016-12-28 NOTE — Patient Instructions (Addendum)
Follow up chest xray  Pleasure seeing you

## 2017-01-01 ENCOUNTER — Encounter: Payer: Self-pay | Admitting: Family

## 2017-01-26 ENCOUNTER — Other Ambulatory Visit: Payer: Self-pay

## 2017-01-26 MED ORDER — SERTRALINE HCL 50 MG PO TABS: 50.0000 mg | ORAL_TABLET | Freq: Every day | ORAL | 3 refills | Status: DC

## 2017-01-26 MED ORDER — LEVOTHYROXINE SODIUM 125 MCG PO TABS: ORAL_TABLET | ORAL | 3 refills | Status: DC

## 2017-01-26 MED ORDER — ATENOLOL 50 MG PO TABS: 50.0000 mg | ORAL_TABLET | Freq: Every day | ORAL | 3 refills | Status: DC

## 2017-01-26 MED ORDER — TRIAMTERENE-HCTZ 37.5-25 MG PO TABS: 1.0000 | ORAL_TABLET | Freq: Every day | ORAL | 3 refills | Status: DC

## 2017-01-26 NOTE — Telephone Encounter (Signed)
Medication has been refilled.

## 2017-02-20 DIAGNOSIS — L4050 Arthropathic psoriasis, unspecified: Secondary | ICD-10-CM | POA: Diagnosis not present

## 2017-04-17 DIAGNOSIS — Z79899 Other long term (current) drug therapy: Secondary | ICD-10-CM | POA: Diagnosis not present

## 2017-04-17 DIAGNOSIS — L4050 Arthropathic psoriasis, unspecified: Secondary | ICD-10-CM | POA: Diagnosis not present

## 2017-06-07 ENCOUNTER — Telehealth: Payer: Self-pay | Admitting: Family

## 2017-06-07 DIAGNOSIS — Z1239 Encounter for other screening for malignant neoplasm of breast: Secondary | ICD-10-CM

## 2017-06-07 NOTE — Telephone Encounter (Signed)
Left message for patient to return call back.  

## 2017-06-07 NOTE — Telephone Encounter (Signed)
Patient has been informed and patient also scheduled appointment for 4OCT@1500 

## 2017-06-07 NOTE — Telephone Encounter (Signed)
Pt called back returning your call. Please advise, thank you!  Call pt @ [phone]

## 2017-06-07 NOTE — Telephone Encounter (Signed)
Call pt Reviewing chart and noticed she is due for mammogram  I have ordered 3D  Please have her schedule and give info below  We placed a referral for mammogram this year. I asked that you call one the below locations and schedule this when it is convenient for you.   As discussed, I would like you to ask for 3D mammogram over the traditional 2D mammogram as new evidence suggest 3D is superior.   Please note that NOT all insurance companies cover 3D and you may have to pay a higher copay. You may call your insurance company to further clarify your benefits.   Options for [place]  [place], [place]  * [facility] [place] [place] * Note if you ask for 3D mammogram at this location, you must request [place], [place] location*

## 2017-06-11 DIAGNOSIS — Z23 Encounter for immunization: Secondary | ICD-10-CM | POA: Diagnosis not present

## 2017-06-21 ENCOUNTER — Ambulatory Visit: Payer: Medicare Other | Admitting: Family

## 2017-06-21 DIAGNOSIS — L4050 Arthropathic psoriasis, unspecified: Secondary | ICD-10-CM | POA: Diagnosis not present

## 2017-06-26 DIAGNOSIS — E669 Obesity, unspecified: Secondary | ICD-10-CM | POA: Diagnosis not present

## 2017-06-26 DIAGNOSIS — R7989 Other specified abnormal findings of blood chemistry: Secondary | ICD-10-CM | POA: Diagnosis not present

## 2017-06-26 DIAGNOSIS — L4050 Arthropathic psoriasis, unspecified: Secondary | ICD-10-CM | POA: Diagnosis not present

## 2017-06-26 DIAGNOSIS — Z6834 Body mass index (BMI) 34.0-34.9, adult: Secondary | ICD-10-CM | POA: Diagnosis not present

## 2017-06-26 DIAGNOSIS — L401 Generalized pustular psoriasis: Secondary | ICD-10-CM | POA: Diagnosis not present

## 2017-06-27 ENCOUNTER — Ambulatory Visit (INDEPENDENT_AMBULATORY_CARE_PROVIDER_SITE_OTHER): Payer: Medicare Other | Admitting: Family

## 2017-06-27 ENCOUNTER — Encounter: Payer: Self-pay | Admitting: Family

## 2017-06-27 VITALS — BP 130/76 | HR 72 | Temp 97.8°F | Ht 63.0 in | Wt 193.2 lb

## 2017-06-27 DIAGNOSIS — I10 Essential (primary) hypertension: Secondary | ICD-10-CM

## 2017-06-27 DIAGNOSIS — E039 Hypothyroidism, unspecified: Secondary | ICD-10-CM

## 2017-06-27 DIAGNOSIS — K219 Gastro-esophageal reflux disease without esophagitis: Secondary | ICD-10-CM | POA: Diagnosis not present

## 2017-06-27 NOTE — Assessment & Plan Note (Signed)
Controlled. Will trial reduction of beta blocker. Will follow.

## 2017-06-27 NOTE — Patient Instructions (Addendum)
Trial of half tablet of atenolol - 25mg  once per day.   Monitor blood pressure,  Goal is less than 130/80; if persistently higher, please make sooner follow up appointment so we can recheck you blood pressure and manage medications  Long term use beyond 3 months of proton pump inhibitors , also called PPI's, is associated with malabsorption of vitamins, chronic kidney disease, fracture risk, and diarrheal illnesses. PPI's include Nexium, Prilosec, Protonix, Dexilant, and Prevacid.   I generally recommend trying to control acid reflux with lifestyle modifications including avoiding trigger foods, not eating 2 hours prior to bedtime. You may use histamine 2 blockers daily to twice daily ( this is Zantac, Pepcid) and then when symptoms flare, start back on PPI for short course.   Please make mammogram appointment as discussed   Pleasure seeing you!

## 2017-06-27 NOTE — Assessment & Plan Note (Signed)
Longstanding. Ordered egd. Discussed long term risks of ppi.

## 2017-06-27 NOTE — Progress Notes (Signed)
Pre visit review using our clinic review tool, if applicable. No additional management support is needed unless otherwise documented below in the visit note. 

## 2017-06-27 NOTE — Progress Notes (Signed)
Subjective:    Patient ID: Jennifer Patrick, female    DOB: July 08, 1951, 66 y.o.   MRN: [id number]  CC: Jennifer Patrick is a 66 y.o. female who presents today for follow up.   HPI: HTN- compliant with medication. Candis Musa if needs medication. Reports 120/70s. Denies exertional chest pain or pressure, numbness or tingling radiating to left arm or jaw, palpitations, dizziness, frequent headaches, changes in vision, or shortness of breath.   Hypothyroidism- compliant with medication. No cold/ heat intolerance  GERD- daily prilosec. Cannot miss a day.       HISTORY:  Past Medical History:  Diagnosis Date  . Anxiety   . Arthritis   . Cataract   . GERD (gastroesophageal reflux disease)   . Hypertension   . Hypothyroidism   . Psoriasis    Dr. Nehemiah Massed, on embrel  . Sleep apnea    Past Surgical History:  Procedure Laterality Date  . ABDOMINAL HYSTERECTOMY  1999   for abnormal pap, TVH  . BREAST BIOPSY Left 1981  . CATARACT EXTRACTION Bilateral   . THYROIDECTOMY  1976  . VITRECTOMY Right 2014   Family History  Problem Relation Age of Onset  . Breast cancer Mother   . Lung cancer Mother   . Macular degeneration Mother   . Sudden death Father        committed suicide  . Diabetes Father   . Lung disease Sister        legionaires  . Hepatitis C Brother     Allergies: Penicillins Current Outpatient Prescriptions on File Prior to Visit  Medication Sig Dispense Refill  . atenolol (TENORMIN) 50 MG tablet Take 1 tablet (50 mg total) by mouth daily. 90 tablet 3  . inFLIXimab in sodium chloride 0.9 % Inject into the vein.    Marland Kitchen levothyroxine (SYNTHROID, LEVOTHROID) 125 MCG tablet TAKE 1 TABLET (125 MCG TOTAL) BY MOUTH DAILY. 90 tablet 3  . omeprazole (PRILOSEC) 40 MG capsule Take 1 capsule (40 mg total) by mouth daily. 90 capsule 3  . sertraline (ZOLOFT) 50 MG tablet Take 1 tablet (50 mg total) by mouth daily. 90 tablet 3  . triamterene-hydrochlorothiazide (MAXZIDE-25)  37.5-25 MG tablet Take 1 tablet by mouth daily. 90 tablet 3   No current facility-administered medications on file prior to visit.     Social History  Substance Use Topics  . Smoking status: Never Smoker  . Smokeless tobacco: Never Used  . Alcohol use Yes     Comment: socially    Review of Systems  Constitutional: Negative for chills and fever.  Respiratory: Negative for cough.   Cardiovascular: Negative for chest pain and palpitations.  Gastrointestinal: Negative for nausea and vomiting.      Objective:    BP 130/76   Pulse 72   Temp 97.8 F (36.6 C) (Oral)   Ht 5\' 3"  (1.6 m)   Wt 193 lb 3.2 oz (87.6 kg)   SpO2 98%   BMI 34.22 kg/m  BP Readings from Last 3 Encounters:  06/27/17 130/76  12/28/16 130/70  12/11/16 118/68   Wt Readings from Last 3 Encounters:  06/27/17 193 lb 3.2 oz (87.6 kg)  12/28/16 194 lb 12.8 oz (88.4 kg)  12/11/16 196 lb (88.9 kg)    Physical Exam  Constitutional: She appears well-developed and well-nourished.  Eyes: Conjunctivae are normal.  Cardiovascular: Normal rate, regular rhythm, normal heart sounds and normal pulses.   Pulmonary/Chest: Effort normal and breath sounds normal. She has no wheezes.  She has no rhonchi. She has no rales.  Neurological: She is alert.  Skin: Skin is warm and dry.  Psychiatric: She has a normal mood and affect. Her speech is normal and behavior is normal. Thought content normal.  Vitals reviewed.      Assessment & Plan:   Problem List Items Addressed This Visit      Cardiovascular and Mediastinum   Hypertension - Primary    Controlled. Will trial reduction of beta blocker. Will follow.       Relevant Orders   Basic metabolic panel     Digestive   GERD (gastroesophageal reflux disease)    Longstanding. Ordered egd. Discussed long term risks of ppi.      Relevant Orders   Ambulatory referral to Gastroenterology     Endocrine   Hypothyroidism    Asymptomatic. Pending tsh      Relevant  Orders   TSH       I am having Ms. Mangiaracina maintain her omeprazole, inFLIXimab in sodium chloride 0.9 %, sertraline, atenolol, triamterene-hydrochlorothiazide, and levothyroxine.   No orders of the defined types were placed in this encounter.   Return precautions given.   Risks, benefits, and alternatives of the medications and treatment plan prescribed today were discussed, and patient expressed understanding.   Education regarding symptom management and diagnosis given to patient on AVS.  Continue to follow with Burnard Hawthorne, FNP for routine health maintenance.   Jennifer Patrick and I agreed with plan.   Mable Paris, FNP

## 2017-06-27 NOTE — Assessment & Plan Note (Signed)
Asymptomatic. Pending tsh

## 2017-07-06 NOTE — Progress Notes (Signed)
Patient is currently out of town. She will return next week and call to schedule appointment.

## 2017-08-03 DIAGNOSIS — L821 Other seborrheic keratosis: Secondary | ICD-10-CM | POA: Diagnosis not present

## 2017-08-08 ENCOUNTER — Ambulatory Visit (INDEPENDENT_AMBULATORY_CARE_PROVIDER_SITE_OTHER): Payer: Medicare Other | Admitting: Gastroenterology

## 2017-08-08 ENCOUNTER — Ambulatory Visit: Payer: Medicare Other | Admitting: Gastroenterology

## 2017-08-08 ENCOUNTER — Encounter: Payer: Self-pay | Admitting: Gastroenterology

## 2017-08-08 VITALS — BP 150/75 | HR 82 | Ht 63.0 in | Wt 196.5 lb

## 2017-08-08 DIAGNOSIS — K219 Gastro-esophageal reflux disease without esophagitis: Secondary | ICD-10-CM | POA: Diagnosis not present

## 2017-08-08 NOTE — Progress Notes (Signed)
Gastroenterology Consultation  Referring Provider:     Burnard Hawthorne, FNP Primary Care Physician:  Burnard Hawthorne, FNP Primary Gastroenterologist:  Dr. Allen Norris     Reason for Consultation:     GERD        HPI:   Jennifer Patrick is a 66 y.o. y/o female referred for consultation & management of GERD by Dr. Vidal Schwalbe, [place] Coder, [place].  This patient comes in today with a history of heartburn for many years.  The patient states that she was concerned about long-term PPI use.  The patient states her symptoms are well controlled on omeprazole 40 mg a day.  She reports that she even misses one dose she has severe pain.  There is no report of any fevers chills nausea or vomiting.  The patient also denies any unexplained weight loss.  She has not had any black stools or bloody stools.  She does report that she believes she had an upper endoscopy in the past but cannot recall when it was.  She denies any acid breakthrough when she is on the medication.  The patient reports that she is really mainly coming here to discuss long-term PPI use.  Past Medical History:  Diagnosis Date  . Anxiety   . Arthritis   . Cataract   . GERD (gastroesophageal reflux disease)   . Hypertension   . Hypothyroidism   . Psoriasis    Dr. Nehemiah Massed, on embrel  . Sleep apnea     Past Surgical History:  Procedure Laterality Date  . ABDOMINAL HYSTERECTOMY  1999   for abnormal pap, TVH  . BREAST BIOPSY Left 1981  . CATARACT EXTRACTION Bilateral   . THYROIDECTOMY  1976  . VITRECTOMY Right 2014    Prior to Admission medications   Medication Sig Start Date End Date Taking? Authorizing Provider  atenolol (TENORMIN) 50 MG tablet Take 1 tablet (50 mg total) by mouth daily. 01/26/17  Yes Burnard Hawthorne, FNP  inFLIXimab in sodium chloride 0.9 % Inject into the vein.   Yes [provider]  levothyroxine (SYNTHROID, LEVOTHROID) 125 MCG tablet TAKE 1 TABLET (125 MCG TOTAL) BY MOUTH DAILY. 01/26/17  Yes  Burnard Hawthorne, FNP  omeprazole (PRILOSEC) 40 MG capsule Take 1 capsule (40 mg total) by mouth daily. 08/25/16  Yes Burnard Hawthorne, FNP  sertraline (ZOLOFT) 50 MG tablet Take 1 tablet (50 mg total) by mouth daily. 01/26/17  Yes Burnard Hawthorne, FNP  triamterene-hydrochlorothiazide (MAXZIDE-25) 37.5-25 MG tablet Take 1 tablet by mouth daily. 01/26/17  Yes Burnard Hawthorne, FNP    Family History  Problem Relation Age of Onset  . Breast cancer Mother   . Lung cancer Mother   . Macular degeneration Mother   . Sudden death Father        committed suicide  . Diabetes Father   . Lung disease Sister        legionaires  . Hepatitis C Brother      Social History   Tobacco Use  . Smoking status: Never Smoker  . Smokeless tobacco: Never Used  Substance Use Topics  . Alcohol use: Yes    Comment: socially  . Drug use: No    Allergies as of 08/08/2017 - Review Complete 08/08/2017  Allergen Reaction Noted  . Penicillins  07/03/2011    Review of Systems:    All systems reviewed and negative except where noted in HPI.   Physical Exam:  BP (!) 150/75 (BP  Location: Left Arm, Patient Position: Sitting, Cuff Size: Large)   Pulse 82   Ht 5\' 3"  (1.6 m)   Wt 196 lb 8 oz (89.1 kg)   BMI 34.81 kg/m  No LMP recorded. Patient has had a hysterectomy. Psych:  Alert and cooperative. Normal mood and affect. General:   Alert,  Well-developed, well-nourished, pleasant and cooperative in NAD Head:  Normocephalic and atraumatic. Eyes:  Sclera clear, no icterus.   Conjunctiva pink. Ears:  Normal auditory acuity. Nose:  No deformity, discharge, or lesions. Mouth:  No deformity or lesions,oropharynx pink & moist. Neck:  Supple; no masses or thyromegaly. Lungs:  Respirations even and unlabored.  Clear throughout to auscultation.   No wheezes, crackles, or rhonchi. No acute distress. Heart:  Regular rate and rhythm; no murmurs, clicks, rubs, or gallops. Abdomen:  Normal bowel sounds.  No  bruits.  Soft, non-tender and non-distended without masses, hepatosplenomegaly or hernias noted.  No guarding or rebound tenderness.  Negative Carnett sign.   Rectal:  Deferred.  Msk:  Symmetrical without gross deformities.  Good, equal movement & strength bilaterally. Pulses:  Normal pulses noted. Extremities:  No clubbing or edema.  No cyanosis. Neurologic:  Alert and oriented x3;  grossly normal neurologically. Skin:  Intact without significant lesions or rashes.  No jaundice. Lymph Nodes:  No significant cervical adenopathy. Psych:  Alert and cooperative. Normal mood and affect.  Imaging Studies: No results found.  Assessment and Plan:   Jennifer Patrick is a 65 y.o. y/o female Who has a history of long-standing heartburn who is well controlled on her omeprazole 40 mg once a day.  The patient states that if she stops her medication for even one day she is miserable.  The patient has been told that there are studies that showed association with decreased iron, magnesium and calcium absorption.  There has also been some data her with a slightly increased risk of kidney failure with a PPI.  The patient states that she would be miserable without her PPI and has been told that as long as she understands the risks and benefits that she can stay on the medication.  The patient has been up-to-date with her colon cancer screening with Cologuard.  She has also been reminded that this needs to be done every 3 years instead of screening colonoscopies every 10 years.  The patient states she understands this.  She will also contact me if she has any further problems.  [name], MD. [name]   Note: This dictation was prepared with Dragon dictation along with smaller phrase technology. Any transcriptional errors that result from this process are unintentional.

## 2017-08-13 ENCOUNTER — Ambulatory Visit
Admission: RE | Admit: 2017-08-13 | Discharge: 2017-08-13 | Disposition: A | Discharge status: Home / Self Care | Payer: Medicare Other | Source: Ambulatory Visit | Attending: Family | Admitting: Family

## 2017-08-13 DIAGNOSIS — Z1231 Encounter for screening mammogram for malignant neoplasm of breast: Secondary | ICD-10-CM | POA: Insufficient documentation

## 2017-08-13 DIAGNOSIS — Z1239 Encounter for other screening for malignant neoplasm of breast: Secondary | ICD-10-CM

## 2017-08-14 ENCOUNTER — Other Ambulatory Visit: Payer: Self-pay | Admitting: Family

## 2017-08-16 DIAGNOSIS — L4050 Arthropathic psoriasis, unspecified: Secondary | ICD-10-CM | POA: Diagnosis not present

## 2017-10-10 DIAGNOSIS — R7989 Other specified abnormal findings of blood chemistry: Secondary | ICD-10-CM | POA: Diagnosis not present

## 2017-10-10 DIAGNOSIS — L4050 Arthropathic psoriasis, unspecified: Secondary | ICD-10-CM | POA: Diagnosis not present

## 2017-10-10 DIAGNOSIS — E669 Obesity, unspecified: Secondary | ICD-10-CM | POA: Diagnosis not present

## 2017-10-10 DIAGNOSIS — L401 Generalized pustular psoriasis: Secondary | ICD-10-CM | POA: Diagnosis not present

## 2017-10-10 DIAGNOSIS — Z6834 Body mass index (BMI) 34.0-34.9, adult: Secondary | ICD-10-CM | POA: Diagnosis not present

## 2017-10-11 DIAGNOSIS — L4050 Arthropathic psoriasis, unspecified: Secondary | ICD-10-CM | POA: Diagnosis not present

## 2017-10-11 DIAGNOSIS — Z79899 Other long term (current) drug therapy: Secondary | ICD-10-CM | POA: Diagnosis not present

## 2017-11-21 DIAGNOSIS — L4050 Arthropathic psoriasis, unspecified: Secondary | ICD-10-CM | POA: Diagnosis not present

## 2017-12-19 DIAGNOSIS — L4050 Arthropathic psoriasis, unspecified: Secondary | ICD-10-CM | POA: Diagnosis not present

## 2018-01-24 DIAGNOSIS — Z961 Presence of intraocular lens: Secondary | ICD-10-CM | POA: Diagnosis not present

## 2018-01-24 DIAGNOSIS — H35363 Drusen (degenerative) of macula, bilateral: Secondary | ICD-10-CM | POA: Diagnosis not present

## 2018-01-31 DIAGNOSIS — Z6834 Body mass index (BMI) 34.0-34.9, adult: Secondary | ICD-10-CM | POA: Diagnosis not present

## 2018-01-31 DIAGNOSIS — E669 Obesity, unspecified: Secondary | ICD-10-CM | POA: Diagnosis not present

## 2018-01-31 DIAGNOSIS — L401 Generalized pustular psoriasis: Secondary | ICD-10-CM | POA: Diagnosis not present

## 2018-01-31 DIAGNOSIS — R7989 Other specified abnormal findings of blood chemistry: Secondary | ICD-10-CM | POA: Diagnosis not present

## 2018-01-31 DIAGNOSIS — L4050 Arthropathic psoriasis, unspecified: Secondary | ICD-10-CM | POA: Diagnosis not present

## 2018-02-07 DIAGNOSIS — H35363 Drusen (degenerative) of macula, bilateral: Secondary | ICD-10-CM | POA: Insufficient documentation

## 2018-02-12 ENCOUNTER — Other Ambulatory Visit: Payer: Self-pay | Admitting: Family

## 2018-02-13 DIAGNOSIS — L4050 Arthropathic psoriasis, unspecified: Secondary | ICD-10-CM | POA: Diagnosis not present

## 2018-02-13 DIAGNOSIS — Z79899 Other long term (current) drug therapy: Secondary | ICD-10-CM | POA: Diagnosis not present

## 2018-03-28 DIAGNOSIS — D485 Neoplasm of uncertain behavior of skin: Secondary | ICD-10-CM | POA: Diagnosis not present

## 2018-03-28 DIAGNOSIS — L400 Psoriasis vulgaris: Secondary | ICD-10-CM | POA: Diagnosis not present

## 2018-03-28 DIAGNOSIS — C44629 Squamous cell carcinoma of skin of left upper limb, including shoulder: Secondary | ICD-10-CM | POA: Diagnosis not present

## 2018-03-29 DIAGNOSIS — C44629 Squamous cell carcinoma of skin of left upper limb, including shoulder: Secondary | ICD-10-CM | POA: Diagnosis not present

## 2018-03-29 DIAGNOSIS — D485 Neoplasm of uncertain behavior of skin: Secondary | ICD-10-CM | POA: Diagnosis not present

## 2018-04-10 DIAGNOSIS — L4050 Arthropathic psoriasis, unspecified: Secondary | ICD-10-CM | POA: Diagnosis not present

## 2018-05-09 DIAGNOSIS — L905 Scar conditions and fibrosis of skin: Secondary | ICD-10-CM | POA: Diagnosis not present

## 2018-05-09 DIAGNOSIS — C44629 Squamous cell carcinoma of skin of left upper limb, including shoulder: Secondary | ICD-10-CM | POA: Diagnosis not present

## 2018-05-09 DIAGNOSIS — L820 Inflamed seborrheic keratosis: Secondary | ICD-10-CM | POA: Diagnosis not present

## 2018-05-15 DIAGNOSIS — Z6835 Body mass index (BMI) 35.0-35.9, adult: Secondary | ICD-10-CM | POA: Diagnosis not present

## 2018-05-15 DIAGNOSIS — L401 Generalized pustular psoriasis: Secondary | ICD-10-CM | POA: Diagnosis not present

## 2018-05-15 DIAGNOSIS — L4050 Arthropathic psoriasis, unspecified: Secondary | ICD-10-CM | POA: Diagnosis not present

## 2018-05-15 DIAGNOSIS — E669 Obesity, unspecified: Secondary | ICD-10-CM | POA: Diagnosis not present

## 2018-05-15 DIAGNOSIS — R7989 Other specified abnormal findings of blood chemistry: Secondary | ICD-10-CM | POA: Diagnosis not present

## 2018-05-17 ENCOUNTER — Other Ambulatory Visit: Payer: Self-pay | Admitting: Family

## 2018-05-17 DIAGNOSIS — I10 Essential (primary) hypertension: Secondary | ICD-10-CM

## 2018-05-17 MED ORDER — ATENOLOL 25 MG PO TABS: 25.0000 mg | ORAL_TABLET | Freq: Every day | ORAL | 3 refills | Status: DC

## 2018-05-17 NOTE — Telephone Encounter (Signed)
Call pt  We reduced atenolol at last visit, she is due for f/u  Please ensure bp okay on 25mg  atenolol- I have refilled

## 2018-05-17 NOTE — Telephone Encounter (Signed)
lov 06/27/17 Atenolol reduced to 1/2 tablet Last filled  02/12/18

## 2018-05-28 ENCOUNTER — Telehealth: Payer: Self-pay

## 2018-05-28 NOTE — Telephone Encounter (Signed)
Copied from [place] [id number]. Topic: Medicare AWV >> May 28, 2018  4:13 PM Bea Graff, NT wrote: Reason for CRM: Pt calling to schedule her AWV. Please call to schedule.

## 2018-05-31 ENCOUNTER — Ambulatory Visit (INDEPENDENT_AMBULATORY_CARE_PROVIDER_SITE_OTHER): Payer: Medicare Other

## 2018-05-31 ENCOUNTER — Telehealth: Payer: Self-pay

## 2018-05-31 VITALS — BP 128/64 | HR 69 | Temp 98.3°F | Resp 15 | Ht 63.5 in | Wt 199.1 lb

## 2018-05-31 DIAGNOSIS — Z0000 Encounter for general adult medical examination without abnormal findings: Secondary | ICD-10-CM

## 2018-05-31 NOTE — Patient Instructions (Addendum)
  Jennifer Patrick , Thank you for taking time to come for your Medicare Wellness Visit. I appreciate your ongoing commitment to your health goals. Please review the following plan we discussed and let me know if I can assist you in the future.   Follow up as needed.    Bring a copy of your [place] and/or Living Will to be scanned into chart.  Have a great day!  These are the goals we discussed: Goals    . DIET - INCREASE WATER INTAKE     Stay hydrated    . Increase physical activity     Stay active       This is a list of the screening recommended for you and due dates:  Health Maintenance  Topic Date Due  . Colon Cancer Screening  12/05/2013  . Mammogram  08/14/2019  . Tetanus Vaccine  07/07/2020  . DEXA scan (bone density measurement)  Completed  .  Hepatitis C: One time screening is recommended by Center for Disease Control  (CDC) for  adults born from [birth year] through 1965.   Completed  . Pneumonia vaccines  Completed  . Flu Shot  Discontinued

## 2018-05-31 NOTE — Progress Notes (Signed)
Subjective:   SARANN TREGRE is a 67 y.o. female who presents for Medicare Annual (Subsequent) preventive examination.  Review of Systems:  No ROS.  Medicare Wellness Visit. Additional risk factors are reflected in the social history.  Cardiac Risk Factors include: advanced age (>2men, >58 women);obesity (BMI >30kg/m2);hypertension     Objective:     Vitals: BP 128/64 (BP Location: Left Arm, Patient Position: Sitting, Cuff Size: Normal)   Pulse 69   Temp 98.3 F (36.8 C) (Oral)   Resp 15   Ht 5' 3.5" (1.613 m)   Wt 199 lb 1.9 oz (90.3 kg)   SpO2 95%   BMI 34.72 kg/m   Body mass index is 34.72 kg/m.  Advanced Directives 05/31/2018 12/16/2014  Does Patient Have a Medical Advance Directive? No No  Would patient like information on creating a medical advance directive? Yes (MAU/Ambulatory/Procedural Areas - Information given) No - patient declined information    Tobacco Social History   Tobacco Use  Smoking Status Never Smoker  Smokeless Tobacco Never Used     Counseling given: Not Answered   Clinical Intake:  Pre-visit preparation completed: Yes  Pain : 0-10 Pain Score: 2  Pain Type: Chronic pain Pain Location: Knee Pain Radiating Towards: joint Pain Descriptors / Indicators: Dull, Aching Effect of Pain on Daily Activities: She paces herself between activities. Follow[facility] by [facility].      Nutritional Status: BMI > 30  Obese Diabetes: No  How often do you need to have someone help you when you read instructions, pamphlets, or other written materials from your doctor or pharmacy?: 1 - Never  Interpreter Needed?: No     Past Medical History:  Diagnosis Date  . Anxiety   . Arthritis   . Cataract   . GERD (gastroesophageal reflux disease)   . Hypertension   . Hypothyroidism   . Psoriasis    Dr. Nehemiah Massed, on embrel  . Sleep apnea    Past Surgical History:  Procedure Laterality Date  . ABDOMINAL HYSTERECTOMY  1999   for  abnormal pap, TVH  . BREAST EXCISIONAL BIOPSY Left 1981   benign  . CATARACT EXTRACTION Bilateral   . THYROIDECTOMY  1976  . VITRECTOMY Right 2014   Family History  Problem Relation Age of Onset  . Breast cancer Mother 55  . Lung cancer Mother   . Macular degeneration Mother   . Sudden death Father        committed suicide  . Diabetes Father   . Lung disease Sister        legionaires  . Hepatitis C Brother    Social History   Socioeconomic History  . Marital status: Widowed    Spouse name: Not on file  . Number of children: 2  . Years of education: Not on file  . Highest education level: Not on file  Occupational History  . Occupation: retired  Scientific laboratory technician  . Financial resource strain: Not hard at all  . Food insecurity:    Worry: Never true    Inability: Never true  . Transportation needs:    Medical: No    Non-medical: No  Tobacco Use  . Smoking status: Never Smoker  . Smokeless tobacco: Never Used  Substance and Sexual Activity  . Alcohol use: Yes    Comment: socially  . Drug use: No  . Sexual activity: Not on file  Lifestyle  . Physical activity:    Days per week: 0 days  Minutes per session: Not on file  . Stress: Not on file  Relationships  . Social connections:    Talks on phone: Not on file    Gets together: Not on file    Attends religious service: Not on file    Active member of club or organization: Not on file    Attends meetings of clubs or organizations: Not on file    Relationship status: Not on file  Other Topics Concern  . Not on file  Social History Narrative   Works at [employer], 2014. Lives in [place].   Enjoys going to [employer]. Grandchildren in [place].       Has son and daughter.   Lives alone, widow since 2012.        Outpatient Encounter Medications as of 05/31/2018  Medication Sig  . atenolol (TENORMIN) 25 MG tablet Take 1 tablet (25 mg total) by mouth daily.  Marland Kitchen levothyroxine (SYNTHROID,  LEVOTHROID) 125 MCG tablet TAKE 1 TABLET BY MOUTH EVERY DAY  . omeprazole (PRILOSEC) 40 MG capsule TAKE 1 CAPSULE (40 MG TOTAL) BY MOUTH DAILY.  Marland Kitchen sertraline (ZOLOFT) 50 MG tablet TAKE 1 TABLET BY MOUTH EVERY DAY  . triamterene-hydrochlorothiazide (MAXZIDE-25) 37.5-25 MG tablet TAKE 1 TABLET BY MOUTH EVERY DAY  . etodolac (LODINE) 400 MG tablet Take 400 mg by mouth 2 (two) times daily with a meal.  . golimumab (SIMPONI ARIA) 50 MG/4ML SOLN injection Inject into the vein.  . [DISCONTINUED] inFLIXimab in sodium chloride 0.9 % Inject into the vein.   No facility-administered encounter medications on file as of 05/31/2018.     Activities of Daily Living In your present state of health, do you have any difficulty performing the following activities: 05/31/2018  Hearing? N  Vision? N  Difficulty concentrating or making decisions? N  Walking or climbing stairs? Y  Comment Joint pain, intermittent  Dressing or bathing? N  Doing errands, shopping? N  Preparing Food and eating ? N  Using the Toilet? N  In the past six months, have you accidently leaked urine? N  Do you have problems with loss of bowel control? Y  Comment Followed by Gertie Fey in [place]. Wears a daily brief.   Managing your Medications? N  Managing your Finances? N  Housekeeping or managing your Housekeeping? N  Some recent data might be hidden    Patient Care Team: Burnard Hawthorne, FNP as PCP - General (Family Medicine) Jackolyn Confer, MD (Internal Medicine)    Assessment:   This is a routine wellness examination for [place].  The goal of the wellness visit is to assist the patient how to close the gaps in care and create a preventative care plan for the patient.   The roster of all physicians providing medical care to patient is listed in the Snapshot section of the chart.  Osteoporosis risk reviewed.  Notes she is followed by [facility], Dr. Amil Amen. Labs are drawn at this facility. Copy requested  for abstraction.   Safety issues reviewed; Lives alone. Smoke and carbon monoxide detectors in the home. No firearms in the home. Wears seatbelts when driving or riding with others. No violence in the home.  They do not have excessive sun exposure.  Discussed the need for sun protection: hats, long sleeves and the use of sunscreen if there is significant sun exposure.  Patient is alert, normal appearance, oriented to person/place/and time.  Correctly identified the president of the [place] and recalls of 3/3 words. Performs  simple calculations and can read correct time from watch face. Displays appropriate judgement.  No new identified risk were noted.  No failures at ADL's or IADL's.    BMI- discussed the importance of a healthy diet, water intake and the benefits of aerobic exercise. Educational material provided. States she does not like exercise but will work on it more. She plans to increase her water intake and make healthy food choices.   24 hour diet recall: Regular diet  Dental- UTD.   Eye- Visual acuity not assessed per patient preference since they have regular follow up with the ophthalmologist.  Wears corrective lenses.  Sleep patterns- Sleeps without issues through the night.   Influenza vaccine declined and removed from future maintenance per patient request.   Colonoscopy and Cologuard discussed. Deferred per patient request.   Hypertension- followed by pcp.  Taking Atenolol 25mg . Monitoring closely.  Increasing to 50mg  daily as needed. Encouraged to take all medications as directed.  Schedule and keep a routine maintenance appointment with her doctor.   Exercise Activities and Dietary recommendations Current Exercise Habits: The patient does not participate in regular exercise at present  Goals    . DIET - INCREASE WATER INTAKE     Stay hydrated    . Increase physical activity     Stay active       Fall Risk Fall Risk  05/31/2018 10/25/2016 07/19/2016 12/16/2014  12/12/2013  Falls in the past year? No Yes No Yes No  Number falls in past yr: - 1 - 2 or more -  Injury with Fall? - Yes - No -  Risk for fall due to : - - - Other (Comment) -  Risk for fall due to: Comment - - - Patient tripped while walking dog no injuries -  Follow up - - - Falls prevention discussed -   Depression Screen PHQ 2/9 Scores 05/31/2018 10/25/2016 07/19/2016 12/16/2014  PHQ - 2 Score 0 0 0 0     Cognitive Function MMSE - Mini Mental State Exam 05/31/2018 12/16/2014  Orientation to time 5 5  Orientation to Place 5 5  Registration 3 3  Attention/ Calculation 5 5  Recall 3 3  Language- name 2 objects 2 2  Language- repeat 1 1  Language- follow 3 step command 3 3  Language- read & follow direction 1 1  Write a sentence 1 1  Copy design 1 1  Total score 30 30        Immunization History  Administered Date(s) Administered  . Influenza Whole 07/03/2011, 07/07/2012  . Influenza,inj,Quad PF,6+ Mos 07/14/2015  . Influenza-Unspecified 07/14/2013, 06/19/2016, 06/10/2017  . Pneumococcal Conjugate-13 12/12/2013  . Pneumococcal Polysaccharide-23 12/16/2014  . Tdap 07/07/2010  . Zoster 10/05/2011   Screening Tests Health Maintenance  Topic Date Due  . COLONOSCOPY  12/05/2013  . MAMMOGRAM  08/14/2019  . TETANUS/TDAP  07/07/2020  . DEXA SCAN  Completed  . Hepatitis C Screening  Completed  . PNA vac Low Risk Adult  Completed  . INFLUENZA VACCINE  Discontinued     Plan:   End of life planning; Advanced aging; Advanced directives discussed.  No HCPOA/Living Will.  Additional information provided to start the conversation with her family. Return a copy of forms upon completion.   I have personally reviewed and noted the following in the patient's chart:   . Medical and social history . Use of alcohol, tobacco or illicit drugs  . Current medications and supplements . Functional ability and status .  Nutritional status . Physical activity . Advanced directives . List  of other physicians . Hospitalizations, surgeries, and ER visits in previous 12 months . Vitals . Screenings to include cognitive, depression, and falls . Referrals and appointments  In addition, I have reviewed and discussed with patient certain preventive protocols, quality metrics, and best practice recommendations. A written personalized care plan for preventive services as well as general preventive health recommendations were provided to patient.     Varney Biles, LPN  0/27/2536   Agree with plan. Mable Paris, NP

## 2018-05-31 NOTE — Telephone Encounter (Signed)
Left a voicemail message for patient to make a follow up a appointment per her primary care physician, Mable Paris.  She would like to follow up for the continued renewal of medications and overall health.  OK to schedule on this Monday 9/16 at 11:00 if patient is agreeable or anytime that is convenient for her.   PEC please read this message to patient as needed. Note in chart.

## 2018-06-05 DIAGNOSIS — L4050 Arthropathic psoriasis, unspecified: Secondary | ICD-10-CM | POA: Diagnosis not present

## 2018-07-31 DIAGNOSIS — L4050 Arthropathic psoriasis, unspecified: Secondary | ICD-10-CM | POA: Diagnosis not present

## 2018-08-05 DIAGNOSIS — L03213 Periorbital cellulitis: Secondary | ICD-10-CM | POA: Diagnosis not present

## 2018-08-12 ENCOUNTER — Other Ambulatory Visit: Payer: Self-pay | Admitting: Family

## 2018-08-12 DIAGNOSIS — L401 Generalized pustular psoriasis: Secondary | ICD-10-CM | POA: Diagnosis not present

## 2018-08-12 DIAGNOSIS — Z6835 Body mass index (BMI) 35.0-35.9, adult: Secondary | ICD-10-CM | POA: Diagnosis not present

## 2018-08-12 DIAGNOSIS — E669 Obesity, unspecified: Secondary | ICD-10-CM | POA: Diagnosis not present

## 2018-08-12 DIAGNOSIS — R7989 Other specified abnormal findings of blood chemistry: Secondary | ICD-10-CM | POA: Diagnosis not present

## 2018-08-12 DIAGNOSIS — L4050 Arthropathic psoriasis, unspecified: Secondary | ICD-10-CM | POA: Diagnosis not present

## 2018-08-17 DIAGNOSIS — Z23 Encounter for immunization: Secondary | ICD-10-CM | POA: Diagnosis not present

## 2018-08-21 DIAGNOSIS — L03213 Periorbital cellulitis: Secondary | ICD-10-CM | POA: Diagnosis not present

## 2018-09-02 ENCOUNTER — Ambulatory Visit
Admission: RE | Admit: 2018-09-02 | Discharge: 2018-09-02 | Disposition: A | Discharge status: Home / Self Care | Payer: Medicare Other | Source: Ambulatory Visit | Attending: Family | Admitting: Family

## 2018-09-02 ENCOUNTER — Ambulatory Visit (INDEPENDENT_AMBULATORY_CARE_PROVIDER_SITE_OTHER): Payer: Medicare Other | Admitting: Family

## 2018-09-02 ENCOUNTER — Encounter: Payer: Self-pay | Admitting: Family

## 2018-09-02 ENCOUNTER — Ambulatory Visit (INDEPENDENT_AMBULATORY_CARE_PROVIDER_SITE_OTHER): Payer: Medicare Other

## 2018-09-02 VITALS — BP 118/70 | HR 73 | Temp 98.2°F | Wt 197.1 lb

## 2018-09-02 DIAGNOSIS — M7989 Other specified soft tissue disorders: Secondary | ICD-10-CM

## 2018-09-02 DIAGNOSIS — J9811 Atelectasis: Secondary | ICD-10-CM | POA: Diagnosis not present

## 2018-09-02 DIAGNOSIS — M79661 Pain in right lower leg: Secondary | ICD-10-CM | POA: Diagnosis not present

## 2018-09-02 NOTE — Progress Notes (Signed)
Subjective:    Patient ID: Jennifer Patrick, female    DOB: 1951-05-10, 67 y.o.   MRN: [id number]  CC: [name] is a 67 y.o. female who presents today for an acute visit.    HPI: Right leg calf swelling and pain x one month, unchanged  Cannot tell difference in swelling.  when standing for long periods of time. No change to physical activity. Can tell that pants are tighter with right leg.   Started 'out of the blue'. No injury.   Throbbing. NO pain with walking.   No cp, sob. No ocp. No h/o dvt.   H/o SCC.   RA- started on new medication, Simponi (DMARD) couple of months ago.        HISTORY:  Past Medical History:  Diagnosis Date  . Anxiety   . Arthritis   . Cataract   . GERD (gastroesophageal reflux disease)   . Hypertension   . Hypothyroidism   . Psoriasis    Dr. Nehemiah Massed, on embrel  . Sleep apnea    Past Surgical History:  Procedure Laterality Date  . ABDOMINAL HYSTERECTOMY  1999   for abnormal pap, TVH  . BREAST EXCISIONAL BIOPSY Left 1981   benign  . CATARACT EXTRACTION Bilateral   . THYROIDECTOMY  1976  . VITRECTOMY Right 2014   Family History  Problem Relation Age of Onset  . Breast cancer Mother 44  . Lung cancer Mother   . Macular degeneration Mother   . Sudden death Father        committed suicide  . Diabetes Father   . Lung disease Sister        legionaires  . Hepatitis C Brother     Allergies: Penicillins Current Outpatient Medications on File Prior to Visit  Medication Sig Dispense Refill  . atenolol (TENORMIN) 25 MG tablet Take 1 tablet (25 mg total) by mouth daily. 90 tablet 3  . etodolac (LODINE) 400 MG tablet Take 400 mg by mouth 2 (two) times daily with a meal.  3  . golimumab (SIMPONI ARIA) 50 MG/4ML SOLN injection Inject into the vein.    Marland Kitchen levothyroxine (SYNTHROID, LEVOTHROID) 125 MCG tablet TAKE 1 TABLET BY MOUTH EVERY DAY 90 tablet 0  . omeprazole (PRILOSEC) 40 MG capsule TAKE 1 CAPSULE BY MOUTH EVERY DAY 90  capsule 3  . sertraline (ZOLOFT) 50 MG tablet TAKE 1 TABLET BY MOUTH EVERY DAY 90 tablet 0  . triamterene-hydrochlorothiazide (MAXZIDE-25) 37.5-25 MG tablet TAKE 1 TABLET BY MOUTH EVERY DAY 90 tablet 0   No current facility-administered medications on file prior to visit.     Social History   Tobacco Use  . Smoking status: Never Smoker  . Smokeless tobacco: Never Used  Substance Use Topics  . Alcohol use: Yes    Comment: socially  . Drug use: No    Review of Systems  Constitutional: Negative for chills and fever.  Respiratory: Negative for cough, shortness of breath and wheezing.   Cardiovascular: Positive for leg swelling (right). Negative for chest pain and palpitations.  Gastrointestinal: Negative for nausea and vomiting.  Musculoskeletal: Negative for arthralgias.      Objective:    BP 118/70 (BP Location: Left Arm, Patient Position: Sitting, Cuff Size: Large)   Pulse 73   Temp 98.2 F (36.8 C)   Wt 197 lb 1.9 oz (89.4 kg)   SpO2 97%   BMI 34.37 kg/m    Physical Exam Vitals signs reviewed.  Constitutional:  Appearance: She is well-developed.  Eyes:     Conjunctiva/sclera: Conjunctivae normal.  Cardiovascular:     Rate and Rhythm: Normal rate and regular rhythm.     Pulses: Normal pulses.     Heart sounds: Normal heart sounds.     Comments: No pitting edema either leg. No palpable cords or masses. No erythema or increased warmth. Significant asymmetry in calf size when compared bilaterally, R > L.  LE hair growth symmetric and present. No discoloration of varicosities noted. LE warm and palpable pedal pulses.  Pulmonary:     Effort: Pulmonary effort is normal.     Breath sounds: Normal breath sounds. No wheezing, rhonchi or rales.  Skin:    General: Skin is warm and dry.  Neurological:     Mental Status: She is alert.  Psychiatric:        Speech: Speech normal.        Behavior: Behavior normal.        Thought Content: Thought content normal.         Assessment & Plan:   1. Right leg swelling No tachycardia , decreased breath sounds. sa02 97%.  There is a obvious difference in the right calf versus left.  Pending stat ultrasound to evaluate for DVT.  Pending chest x-ray, to ensure no cardiomegaly.  Close follow-up. Of note, serious reactions of Simponi do not list DVT.  Vasculitis is listed.  No erythema, warmth to right suspicion for infection or vasculitis at this time.  - US Venous Img Lower Unilateral Right - DG Chest 2 View    I am having [name] Madrazo maintain her atenolol, etodolac, golimumab, omeprazole, triamterene-hydrochlorothiazide, sertraline, and levothyroxine.   No orders of the defined types were placed in this encounter.   Return precautions given.   Risks, benefits, and alternatives of the medications and treatment plan prescribed today were discussed, and patient expressed understanding.   Education regarding symptom management and diagnosis given to patient on AVS.  Continue to follow with Burnard Hawthorne, FNP for routine health maintenance.   Jennifer Patrick and I agreed with plan.   Mable Paris, FNP

## 2018-09-02 NOTE — Patient Instructions (Signed)
We will pursue right ultrasound of the leg.  If negative for blood clot, I am considering venous insufficiency for edema.  May use compression stockings for this. Low salt diet     Please ensure close follow-up with me as you are overdue for your physical.

## 2018-09-25 DIAGNOSIS — L4050 Arthropathic psoriasis, unspecified: Secondary | ICD-10-CM | POA: Diagnosis not present

## 2018-11-14 ENCOUNTER — Other Ambulatory Visit: Payer: Self-pay | Admitting: Family

## 2018-11-20 DIAGNOSIS — L4050 Arthropathic psoriasis, unspecified: Secondary | ICD-10-CM | POA: Diagnosis not present

## 2018-11-20 DIAGNOSIS — Z79899 Other long term (current) drug therapy: Secondary | ICD-10-CM | POA: Diagnosis not present

## 2019-01-15 DIAGNOSIS — L4050 Arthropathic psoriasis, unspecified: Secondary | ICD-10-CM | POA: Diagnosis not present

## 2019-03-12 DIAGNOSIS — L4050 Arthropathic psoriasis, unspecified: Secondary | ICD-10-CM | POA: Diagnosis not present

## 2019-03-12 DIAGNOSIS — Z79899 Other long term (current) drug therapy: Secondary | ICD-10-CM | POA: Diagnosis not present

## 2019-04-08 DIAGNOSIS — R7989 Other specified abnormal findings of blood chemistry: Secondary | ICD-10-CM | POA: Diagnosis not present

## 2019-04-08 DIAGNOSIS — E669 Obesity, unspecified: Secondary | ICD-10-CM | POA: Diagnosis not present

## 2019-04-08 DIAGNOSIS — L401 Generalized pustular psoriasis: Secondary | ICD-10-CM | POA: Diagnosis not present

## 2019-04-08 DIAGNOSIS — Z6833 Body mass index (BMI) 33.0-33.9, adult: Secondary | ICD-10-CM | POA: Diagnosis not present

## 2019-04-08 DIAGNOSIS — L4050 Arthropathic psoriasis, unspecified: Secondary | ICD-10-CM | POA: Diagnosis not present

## 2019-04-08 DIAGNOSIS — M25512 Pain in left shoulder: Secondary | ICD-10-CM | POA: Diagnosis not present

## 2019-04-16 DIAGNOSIS — H04122 Dry eye syndrome of left lacrimal gland: Secondary | ICD-10-CM | POA: Diagnosis not present

## 2019-05-07 DIAGNOSIS — Z79899 Other long term (current) drug therapy: Secondary | ICD-10-CM | POA: Diagnosis not present

## 2019-05-07 DIAGNOSIS — L4050 Arthropathic psoriasis, unspecified: Secondary | ICD-10-CM | POA: Diagnosis not present

## 2019-05-16 DIAGNOSIS — H02883 Meibomian gland dysfunction of right eye, unspecified eyelid: Secondary | ICD-10-CM | POA: Diagnosis not present

## 2019-05-16 DIAGNOSIS — H35341 Macular cyst, hole, or pseudohole, right eye: Secondary | ICD-10-CM | POA: Diagnosis not present

## 2019-06-03 ENCOUNTER — Ambulatory Visit (INDEPENDENT_AMBULATORY_CARE_PROVIDER_SITE_OTHER): Payer: Medicare Other

## 2019-06-03 ENCOUNTER — Other Ambulatory Visit: Payer: Self-pay

## 2019-06-03 DIAGNOSIS — Z0000 Encounter for general adult medical examination without abnormal findings: Secondary | ICD-10-CM

## 2019-06-03 NOTE — Patient Instructions (Addendum)
  Jennifer Patrick , Thank you for taking time to come for your Medicare Wellness Visit. I appreciate your ongoing commitment to your health goals. Please review the following plan we discussed and let me know if I can assist you in the future.   These are the goals we discussed: Goals    . DIET - INCREASE WATER INTAKE     Stay hydrated    . Increase physical activity     Stay active       This is a list of the screening recommended for you and due dates:  Health Maintenance  Topic Date Due  . Mammogram  08/14/2019  . Cologuard (Stool DNA test)  12/08/2019  . Tetanus Vaccine  07/07/2020  . DEXA scan (bone density measurement)  Completed  .  Hepatitis C: One time screening is recommended by Center for Disease Control  (CDC) for  adults born from [birth year] through 1967.   Completed  . Pneumonia vaccines  Completed  . Flu Shot  Discontinued

## 2019-06-03 NOTE — Progress Notes (Signed)
Subjective:   Jennifer Patrick is a 68 y.o. female who presents for Medicare Annual (Subsequent) preventive examination.  Review of Systems:  No ROS.  Medicare Wellness Virtual Visit.  Visual/audio telehealth visit, UTA vital signs.   See social history for additional risk factors.   Cardiac Risk Factors include: advanced age (>25men, >40 women);hypertension     Objective:     Vitals: There were no vitals taken for this visit.  There is no height or weight on file to calculate BMI.  Advanced Directives 05/31/2018 12/16/2014  Does Patient Have a Medical Advance Directive? No No  Would patient like information on creating a medical advance directive? Yes (MAU/Ambulatory/Procedural Areas - Information given) No - patient declined information    Tobacco Social History   Tobacco Use  Smoking Status Never Smoker  Smokeless Tobacco Never Used     Counseling given: Not Answered   Clinical Intake:  Pre-visit preparation completed: Yes        Diabetes: No  How often do you need to have someone help you when you read instructions, pamphlets, or other written materials from your doctor or pharmacy?: 1 - Never  Interpreter Needed?: No     Past Medical History:  Diagnosis Date  . Anxiety   . Arthritis   . Cataract   . GERD (gastroesophageal reflux disease)   . Hypertension   . Hypothyroidism   . Psoriasis    Dr. Nehemiah Massed, on embrel  . Sleep apnea    Past Surgical History:  Procedure Laterality Date  . ABDOMINAL HYSTERECTOMY  1999   for abnormal pap, TVH  . BREAST EXCISIONAL BIOPSY Left 1981   benign  . CATARACT EXTRACTION Bilateral   . THYROIDECTOMY  1976  . VITRECTOMY Right 2014   Family History  Problem Relation Age of Onset  . Breast cancer Mother 54  . Lung cancer Mother   . Macular degeneration Mother   . Sudden death Father        committed suicide  . Diabetes Father   . Lung disease Sister        legionaires  . Hepatitis C Brother     Social History   Socioeconomic History  . Marital status: Widowed    Spouse name: Not on file  . Number of children: 2  . Years of education: Not on file  . Highest education level: Not on file  Occupational History  . Occupation: retired  Scientific laboratory technician  . Financial resource strain: Not hard at all  . Food insecurity    Worry: Never true    Inability: Never true  . Transportation needs    Medical: No    Non-medical: No  Tobacco Use  . Smoking status: Never Smoker  . Smokeless tobacco: Never Used  Substance and Sexual Activity  . Alcohol use: Yes    Comment: socially  . Drug use: No  . Sexual activity: Not on file  Lifestyle  . Physical activity    Days per week: 0 days    Minutes per session: Not on file  . Stress: Only a little  Relationships  . Social Herbalist on phone: Not on file    Gets together: Not on file    Attends religious service: Not on file    Active member of club or organization: Not on file    Attends meetings of clubs or organizations: Not on file    Relationship status: Not on file  Other Topics  Concern  . Not on file  Social History Narrative   Works at [employer], 2014. Lives in [place].   Enjoys going to [employer]. Grandchildren in [place].       Has son and daughter.   Lives alone, widow since 2012.        Outpatient Encounter Medications as of 06/03/2019  Medication Sig  . atenolol (TENORMIN) 25 MG tablet Take 1 tablet (25 mg total) by mouth daily.  Marland Kitchen etodolac (LODINE) 400 MG tablet Take 400 mg by mouth 2 (two) times daily with a meal.  . golimumab (SIMPONI ARIA) 50 MG/4ML SOLN injection Inject into the vein.  Marland Kitchen levothyroxine (SYNTHROID, LEVOTHROID) 125 MCG tablet TAKE 1 TABLET BY MOUTH EVERY DAY  . omeprazole (PRILOSEC) 40 MG capsule TAKE 1 CAPSULE BY MOUTH EVERY DAY  . sertraline (ZOLOFT) 50 MG tablet TAKE 1 TABLET BY MOUTH EVERY DAY  . triamterene-hydrochlorothiazide (MAXZIDE-25) 37.5-25 MG tablet TAKE 1  TABLET BY MOUTH EVERY DAY   No facility-administered encounter medications on file as of 06/03/2019.     Activities of Daily Living In your present state of health, do you have any difficulty performing the following activities: 06/03/2019  Hearing? N  Vision? N  Difficulty concentrating or making decisions? N  Walking or climbing stairs? N  Dressing or bathing? N  Doing errands, shopping? N  Preparing Food and eating ? N  Using the Toilet? N  In the past six months, have you accidently leaked urine? N  Do you have problems with loss of bowel control? N  Managing your Medications? N  Managing your Finances? N  Housekeeping or managing your Housekeeping? N  Some recent data might be hidden    Patient Care Team: Burnard Hawthorne, FNP as PCP - General (Family Medicine) Jackolyn Confer, MD (Internal Medicine)    Assessment:   This is a routine wellness examination for Jennifer Patrick.  I connected with patient 06/03/19 at  9:30 AM EDT by an audio enabled telemedicine application and verified that I am speaking with the correct person using two identifiers. Patient stated full name and DOB. Patient gave permission to continue with virtual visit. Patient's location was at home and Nurse's location was at [place] office.   Health Maintenance Due: Update all pending maintenance due as appropriate.   See completed HM at the end of note.   Eye: Visual acuity not assessed. Virtual visit. Wears corrective lenses. Followed by their ophthalmologist every 12 months.   Dental: Visits every 6 months.    Hearing: Demonstrates normal hearing during visit.  Safety:  Patient feels safe at home- yes Patient does have smoke detectors at home- yes Patient does wear sunscreen or protective clothing when in direct sunlight - yes Patient does wear seat belt when in a moving vehicle - yes Patient drives- yes Adequate lighting in walkways free from debris- yes Grab bars and handrails used as  appropriate- yes Ambulates with no assistive device Cell phone on person when ambulating outside of the home- yes  Social: Alcohol intake - yes      Smoking history- never   Smokers in home? none Illicit drug use? none  Depression: PHQ 2 &9 complete. See screening below. Denies irritability, anhedonia, sadness/tearfullness.  Stable.   Falls: See screening below.    Medication: Taking as directed and without issues.   Covid-19: Precautions and sickness symptoms discussed. Wears mask, social distancing, hand hygiene as appropriate.   Activities of Daily Living Patient denies needing  assistance with: household chores, feeding themselves, getting from bed to chair, getting to the toilet, bathing/showering, dressing, managing money, or preparing meals.   Memory: Patient is alert. Patient denies difficulty focusing or concentrating. Correctly identified the president of the [place], season and recall. Patient likes to read, play sodoku for brain stimulation.  BMI- discussed the importance of a healthy diet, water intake and the benefits of aerobic exercise.  Educational material provided.  Physical activity- no routine  Diet:  Regular Water: good intake  Advanced Directive: End of life planning; Advanced aging; Advanced directives discussed.  No HCPOA/Living Will.  Additional information declined at this time.  Other Providers Patient Care Team: Burnard Hawthorne, FNP as PCP - General (Family Medicine) Jackolyn Confer, MD (Internal Medicine)  Exercise Activities and Dietary recommendations Current Exercise Habits: Home exercise routine, Type of exercise: walking, Intensity: Mild  Goals    . DIET - INCREASE WATER INTAKE     Stay hydrated    . Increase physical activity     Stay active       Fall Risk Fall Risk  06/03/2019 05/31/2018 10/25/2016 07/19/2016 12/16/2014  Falls in the past year? 0 No Yes No Yes  Number falls in past yr: - - 1 - 2 or more  Injury with Fall?  - - Yes - No  Risk for fall due to : - - - - Other (Comment)  Risk for fall due to: Comment - - - - Patient tripped while walking dog no injuries  Follow up - - - - Falls prevention discussed   Timed Get Up and Go performed: no, virtual visit  Depression Screen PHQ 2/9 Scores 06/03/2019 09/02/2018 05/31/2018 10/25/2016  PHQ - 2 Score 0 0 0 0  PHQ- 9 Score - 1 - -     Cognitive Function MMSE - Mini Mental State Exam 05/31/2018 12/16/2014  Orientation to time 5 5  Orientation to Place 5 5  Registration 3 3  Attention/ Calculation 5 5  Recall 3 3  Language- name 2 objects 2 2  Language- repeat 1 1  Language- follow 3 step command 3 3  Language- read & follow direction 1 1  Write a sentence 1 1  Copy design 1 1  Total score 30 30     6CIT Screen 06/03/2019  What Year? 0 points  What month? 0 points  What time? 0 points  Count back from 20 0 points  Months in reverse 0 points  Repeat phrase 0 points  Total Score 0    Immunization History  Administered Date(s) Administered  . Influenza Whole 07/03/2011, 07/07/2012  . Influenza,inj,Quad PF,6+ Mos 07/14/2015  . Influenza-Unspecified 07/14/2013, 06/19/2016, 06/10/2017, 06/03/2019  . Pneumococcal Conjugate-13 12/12/2013, 06/03/2019  . Pneumococcal Polysaccharide-23 12/16/2014  . Tdap 07/07/2010  . Zoster 10/05/2011   Screening Tests Health Maintenance  Topic Date Due  . MAMMOGRAM  08/14/2019  . Fecal DNA (Cologuard)  12/08/2019  . TETANUS/TDAP  07/07/2020  . DEXA SCAN  Completed  . Hepatitis C Screening  Completed  . PNA vac Low Risk Adult  Completed  . INFLUENZA VACCINE  Discontinued      Plan:   Keep all routine maintenance appointments.   Medicare Attestation I have personally reviewed: The patient's medical and social history Their use of alcohol, tobacco or illicit drugs Their current medications and supplements The patient's functional ability including ADLs,fall risks, home safety risks, cognitive, and  hearing and visual impairment Diet and physical activities Evidence for  depression   In addition, I have reviewed and discussed with patient certain preventive protocols, quality metrics, and best practice recommendations. A written personalized care plan for preventive services as well as general preventive health recommendations were provided to patient via mail.     Varney Biles, LPN  [phone]

## 2019-06-10 DIAGNOSIS — Z23 Encounter for immunization: Secondary | ICD-10-CM | POA: Diagnosis not present

## 2019-07-02 DIAGNOSIS — L4050 Arthropathic psoriasis, unspecified: Secondary | ICD-10-CM | POA: Diagnosis not present

## 2019-07-13 ENCOUNTER — Other Ambulatory Visit: Payer: Self-pay | Admitting: Family

## 2019-07-13 ENCOUNTER — Encounter: Payer: Self-pay | Admitting: Family

## 2019-07-14 ENCOUNTER — Other Ambulatory Visit: Payer: Self-pay

## 2019-07-14 DIAGNOSIS — I10 Essential (primary) hypertension: Secondary | ICD-10-CM

## 2019-07-14 MED ORDER — TRIAMTERENE-HCTZ 37.5-25 MG PO TABS: 1.0000 | ORAL_TABLET | Freq: Every day | ORAL | 0 refills | Status: DC

## 2019-07-14 MED ORDER — SERTRALINE HCL 50 MG PO TABS: 50.0000 mg | ORAL_TABLET | Freq: Every day | ORAL | 0 refills | Status: DC

## 2019-07-14 MED ORDER — ATENOLOL 25 MG PO TABS: 25.0000 mg | ORAL_TABLET | Freq: Every day | ORAL | 0 refills | Status: DC

## 2019-07-14 MED ORDER — LEVOTHYROXINE SODIUM 125 MCG PO TABS: 125.0000 ug | ORAL_TABLET | Freq: Every day | ORAL | 0 refills | Status: DC

## 2019-08-27 DIAGNOSIS — L4050 Arthropathic psoriasis, unspecified: Secondary | ICD-10-CM | POA: Diagnosis not present

## 2019-09-05 ENCOUNTER — Other Ambulatory Visit: Payer: Self-pay | Admitting: Family

## 2019-09-18 IMAGING — DX DG CHEST 2V
2 series · 2 of 2 positions shown · non-contrast
Comparison: 12/28/2016.

CLINICAL DATA: Right leg swelling.

EXAM:
CHEST - 2 VIEW

[chest pa]
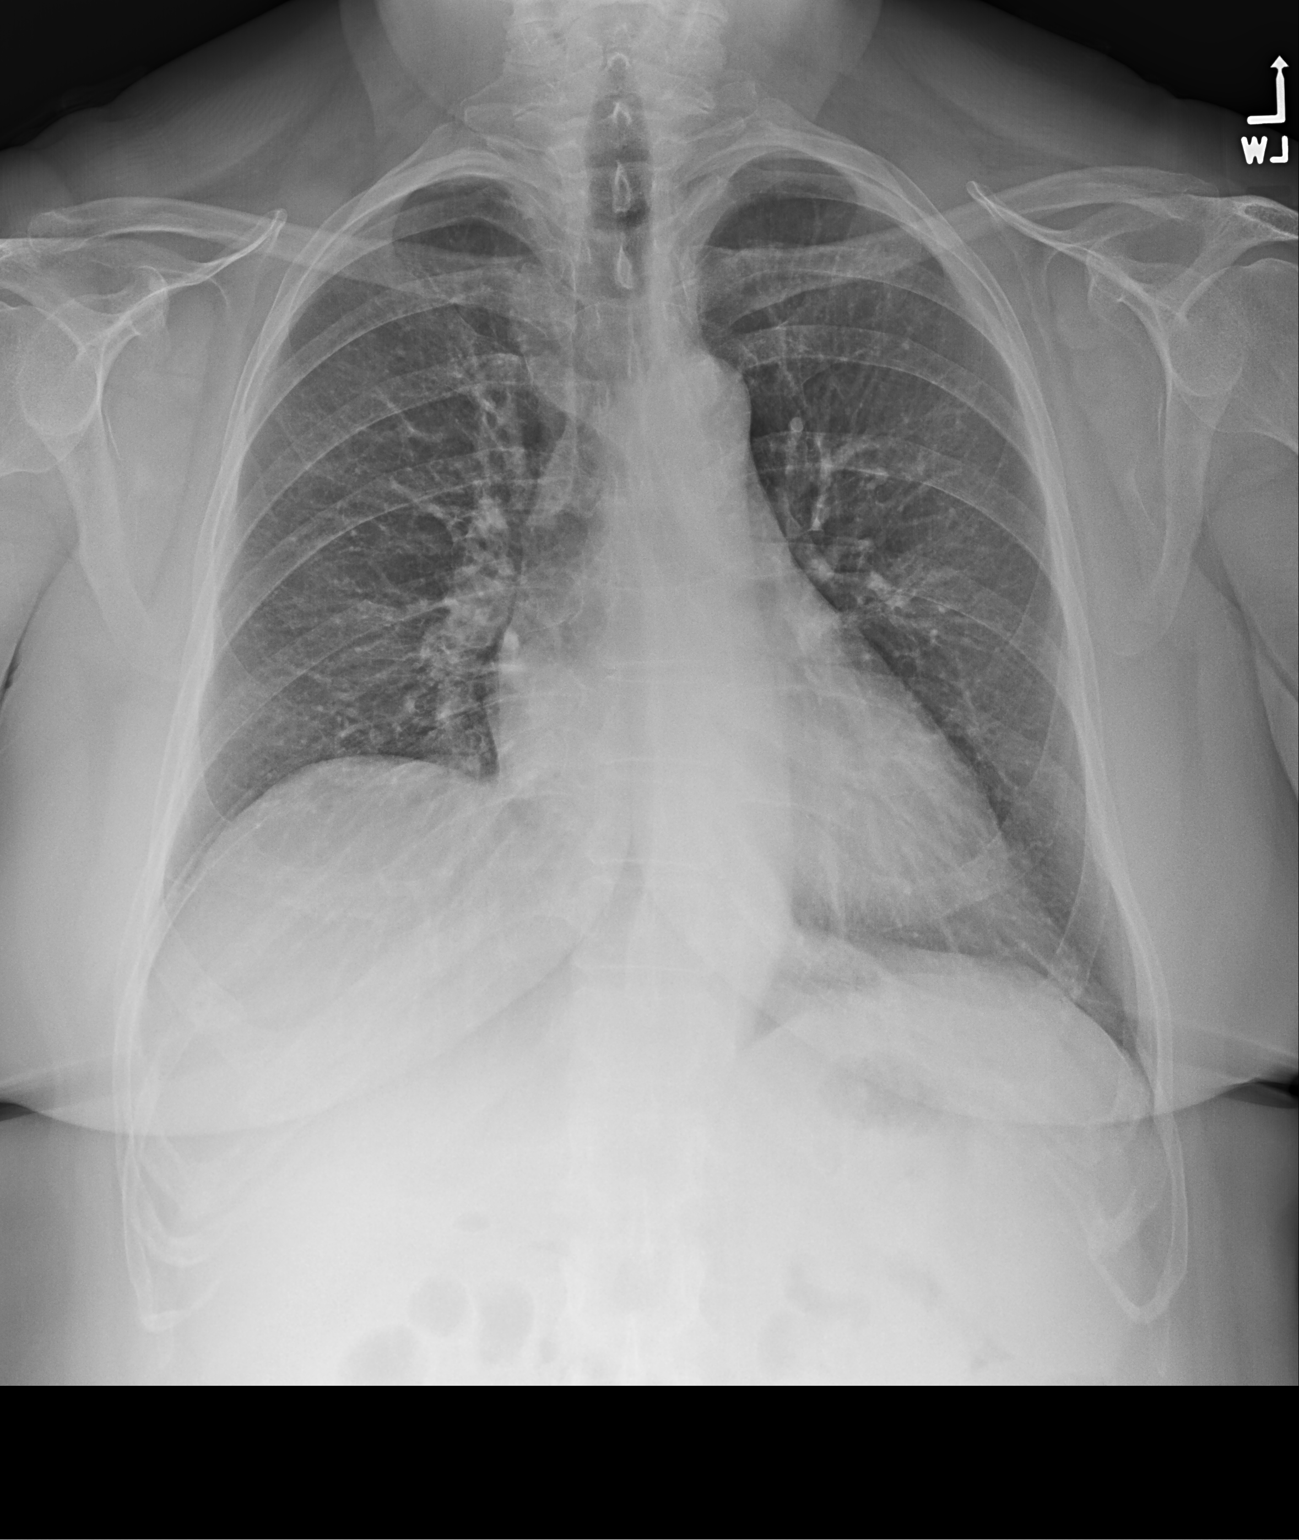

[chest lat]
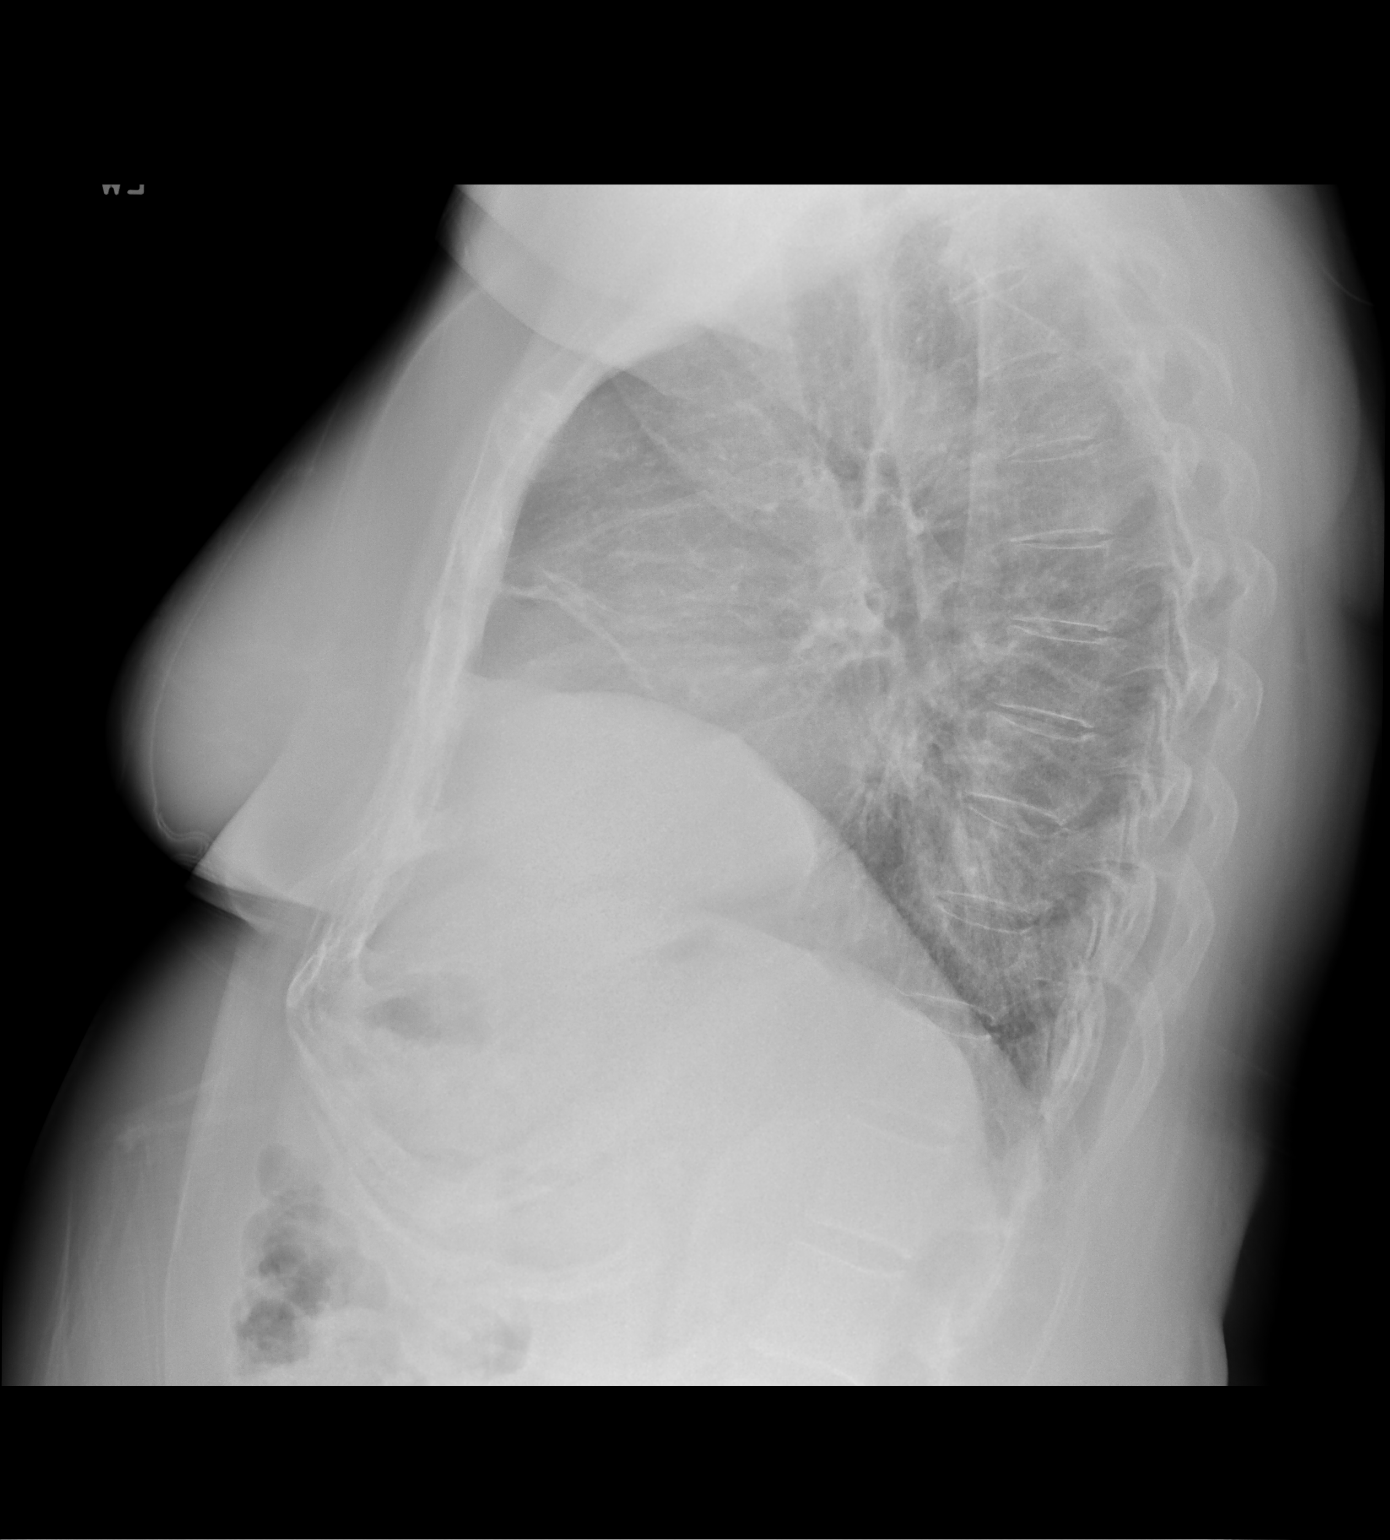

[2 of 2 positions shown; findings below may reference images not displayed]

FINDINGS: Mediastinum hilar structures normal. Heart size stable. Mild
subsegmental atelectasis right middle lobe. No focal infiltrate. No
pleural effusion or pneumothorax.
IMPRESSION: Mild subsegmental atelectasis right middle lobe. Exam otherwise
unremarkable.

## 2019-10-10 ENCOUNTER — Other Ambulatory Visit: Payer: Self-pay | Admitting: Family

## 2019-10-10 DIAGNOSIS — I10 Essential (primary) hypertension: Secondary | ICD-10-CM

## 2019-10-22 DIAGNOSIS — L4050 Arthropathic psoriasis, unspecified: Secondary | ICD-10-CM | POA: Diagnosis not present

## 2019-10-22 DIAGNOSIS — Z79899 Other long term (current) drug therapy: Secondary | ICD-10-CM | POA: Diagnosis not present

## 2019-12-10 DIAGNOSIS — R7989 Other specified abnormal findings of blood chemistry: Secondary | ICD-10-CM | POA: Diagnosis not present

## 2019-12-10 DIAGNOSIS — L401 Generalized pustular psoriasis: Secondary | ICD-10-CM | POA: Diagnosis not present

## 2019-12-10 DIAGNOSIS — Z6833 Body mass index (BMI) 33.0-33.9, adult: Secondary | ICD-10-CM | POA: Diagnosis not present

## 2019-12-10 DIAGNOSIS — L4050 Arthropathic psoriasis, unspecified: Secondary | ICD-10-CM | POA: Diagnosis not present

## 2019-12-10 DIAGNOSIS — E669 Obesity, unspecified: Secondary | ICD-10-CM | POA: Diagnosis not present

## 2019-12-17 DIAGNOSIS — L4050 Arthropathic psoriasis, unspecified: Secondary | ICD-10-CM | POA: Diagnosis not present

## 2019-12-30 ENCOUNTER — Telehealth: Payer: Self-pay | Admitting: Family

## 2019-12-30 MED ORDER — TRIAMTERENE-HCTZ 37.5-25 MG PO TABS: 1.0000 | ORAL_TABLET | Freq: Every day | ORAL | 0 refills | Status: DC

## 2019-12-30 MED ORDER — SERTRALINE HCL 50 MG PO TABS: 50.0000 mg | ORAL_TABLET | Freq: Every day | ORAL | 0 refills | Status: DC

## 2019-12-30 MED ORDER — LEVOTHYROXINE SODIUM 125 MCG PO TABS: 125.0000 ug | ORAL_TABLET | Freq: Every day | ORAL | 0 refills | Status: DC

## 2019-12-30 NOTE — Telephone Encounter (Signed)
Patient needs the following refills; sertraline (ZOLOFT) 50 MG tablet, triamterene-hydrochlorothiazide (MAXZIDE-25) 37.5-25 MG tablet, levothyroxine (SYNTHROID) 125 MCG tablet

## 2020-01-19 ENCOUNTER — Encounter: Payer: Self-pay | Admitting: Family

## 2020-01-19 ENCOUNTER — Ambulatory Visit (INDEPENDENT_AMBULATORY_CARE_PROVIDER_SITE_OTHER): Payer: Medicare Other | Admitting: Family

## 2020-01-19 ENCOUNTER — Ambulatory Visit (INDEPENDENT_AMBULATORY_CARE_PROVIDER_SITE_OTHER)
Admission: RE | Admit: 2020-01-19 | Discharge: 2020-01-19 | Disposition: A | Discharge status: Home / Self Care | Payer: Medicare Other | Source: Ambulatory Visit | Attending: Family | Admitting: Family

## 2020-01-19 ENCOUNTER — Other Ambulatory Visit: Payer: Self-pay | Admitting: Family

## 2020-01-19 ENCOUNTER — Other Ambulatory Visit: Payer: Self-pay

## 2020-01-19 VITALS — BP 104/70 | HR 73 | Temp 96.8°F | Ht 63.0 in | Wt 186.8 lb

## 2020-01-19 DIAGNOSIS — J90 Pleural effusion, not elsewhere classified: Secondary | ICD-10-CM | POA: Diagnosis not present

## 2020-01-19 DIAGNOSIS — F3342 Major depressive disorder, recurrent, in full remission: Secondary | ICD-10-CM

## 2020-01-19 DIAGNOSIS — M1611 Unilateral primary osteoarthritis, right hip: Secondary | ICD-10-CM | POA: Diagnosis not present

## 2020-01-19 DIAGNOSIS — Z1231 Encounter for screening mammogram for malignant neoplasm of breast: Secondary | ICD-10-CM

## 2020-01-19 DIAGNOSIS — J986 Disorders of diaphragm: Secondary | ICD-10-CM | POA: Diagnosis not present

## 2020-01-19 DIAGNOSIS — I10 Essential (primary) hypertension: Secondary | ICD-10-CM

## 2020-01-19 DIAGNOSIS — Z780 Asymptomatic menopausal state: Secondary | ICD-10-CM | POA: Diagnosis not present

## 2020-01-19 DIAGNOSIS — M7989 Other specified soft tissue disorders: Secondary | ICD-10-CM | POA: Insufficient documentation

## 2020-01-19 DIAGNOSIS — M47816 Spondylosis without myelopathy or radiculopathy, lumbar region: Secondary | ICD-10-CM | POA: Diagnosis not present

## 2020-01-19 DIAGNOSIS — R1031 Right lower quadrant pain: Secondary | ICD-10-CM

## 2020-01-19 DIAGNOSIS — Z1211 Encounter for screening for malignant neoplasm of colon: Secondary | ICD-10-CM

## 2020-01-19 DIAGNOSIS — E039 Hypothyroidism, unspecified: Secondary | ICD-10-CM | POA: Diagnosis not present

## 2020-01-19 DIAGNOSIS — E559 Vitamin D deficiency, unspecified: Secondary | ICD-10-CM | POA: Diagnosis not present

## 2020-01-19 DIAGNOSIS — Z0000 Encounter for general adult medical examination without abnormal findings: Secondary | ICD-10-CM

## 2020-01-19 DIAGNOSIS — K219 Gastro-esophageal reflux disease without esophagitis: Secondary | ICD-10-CM | POA: Diagnosis not present

## 2020-01-19 DIAGNOSIS — M5136 Other intervertebral disc degeneration, lumbar region: Secondary | ICD-10-CM | POA: Diagnosis not present

## 2020-01-19 LAB — COMPREHENSIVE METABOLIC PANEL
ALT: 13 U/L (ref 0–35)
AST: 23 U/L (ref 0–37)
Albumin: 4.1 g/dL (ref 3.5–5.2)
Alkaline Phosphatase: 94 U/L (ref 39–117)
BUN: 11 mg/dL (ref 6–23)
CO2: 29 mEq/L (ref 19–32)
Calcium: 9.1 mg/dL (ref 8.4–10.5)
Chloride: 103 mEq/L (ref 96–112)
Creatinine, Ser: 0.89 mg/dL (ref 0.40–1.20)
GFR: 62.5 mL/min (ref >60.00)
Glucose, Bld: 97 mg/dL (ref 70–99)
Potassium: 4.1 mEq/L (ref 3.5–5.1)
Sodium: 138 mEq/L (ref 135–145)
Total Bilirubin: 0.7 mg/dL (ref 0.2–1.2)
Total Protein: 7 g/dL (ref 6.0–8.3)

## 2020-01-19 LAB — LIPID PANEL
Cholesterol: 217 mg/dL — ABNORMAL HIGH (ref 0–200)
HDL: 39.5 mg/dL (ref >39.00)
LDL Cholesterol: 143 mg/dL — ABNORMAL HIGH (ref 0–99)
NonHDL: 177.39
Total CHOL/HDL Ratio: 5
Triglycerides: 170 mg/dL — ABNORMAL HIGH (ref 0.0–149.0)
VLDL: 34 mg/dL (ref 0.0–40.0)

## 2020-01-19 LAB — CBC WITH DIFFERENTIAL/PLATELET
Basophils Absolute: 0.1 10*3/uL (ref 0.0–0.1)
Basophils Relative: 0.7 % (ref 0.0–3.0)
Eosinophils Absolute: 0.3 10*3/uL (ref 0.0–0.7)
Eosinophils Relative: 3.4 % (ref 0.0–5.0)
HCT: 41.2 % (ref 36.0–46.0)
Hemoglobin: 13.7 g/dL (ref 12.0–15.0)
Lymphocytes Relative: 36 % (ref 12.0–46.0)
Lymphs Abs: 3 10*3/uL (ref 0.7–4.0)
MCHC: 33.3 g/dL (ref 30.0–36.0)
MCV: 85 fl (ref 78.0–100.0)
Monocytes Absolute: 0.7 10*3/uL (ref 0.1–1.0)
Monocytes Relative: 8 % (ref 3.0–12.0)
Neutro Abs: 4.4 10*3/uL (ref 1.4–7.7)
Neutrophils Relative %: 51.9 % (ref 43.0–77.0)
Platelets: 202 10*3/uL (ref 150.0–400.0)
RBC: 4.85 Mil/uL (ref 3.87–5.11)
RDW: 14.2 % (ref 11.5–15.5)
WBC: 8.4 10*3/uL (ref 4.0–10.5)

## 2020-01-19 LAB — VITAMIN D 25 HYDROXY (VIT D DEFICIENCY, FRACTURES): VITD: 19.24 ng/mL — ABNORMAL LOW (ref 30.00–100.00)

## 2020-01-19 LAB — TSH: TSH: 5.51 u[IU]/mL — ABNORMAL HIGH (ref 0.35–4.50)

## 2020-01-19 NOTE — Assessment & Plan Note (Signed)
No family history of colon cancer or personal history.  Patient will  consider Cologuard versus colonoscopy.  She will call Her insurance company in regards to this.

## 2020-01-19 NOTE — Assessment & Plan Note (Signed)
No swelling today.Low risk DVT based on wells criteria. Patient declines repeat US right leg as we did in 2019.  Discussed conservative measures including compression stockings, low-salt, elevation.  She will also try capsaicin over-the-counter.  She declines consult with orthopedics or neurology at this time.

## 2020-01-19 NOTE — Assessment & Plan Note (Signed)
Stable on zoloft. Will continue

## 2020-01-19 NOTE — Assessment & Plan Note (Signed)
Discussed with patient, my suspicion for hip etiology.  Pending x-rays

## 2020-01-19 NOTE — Assessment & Plan Note (Signed)
Pending tsh 

## 2020-01-19 NOTE — Progress Notes (Signed)
Subjective:    Patient ID: Jennifer Patrick, female    DOB: 1951-04-12, 69 y.o.   MRN: [id number]  CC: Jennifer Patrick is a 69 y.o. female who presents today for physical exam.    HPI: Complains of right leg couple of weeks ago, comes and goes. Worsens as day goes on. Improves with elevation. No swelling today. NO redness, heat from leg.  NO h/o cancer. No recent surgeries. No sob. No h/o dvt.  Also complains of right groin pain, with associated with numbness which starts in the right ankle and extends to right knee  for couple of weeks. Comes and goes. 'Thought I pulled a muscle' as onset  Correlated to getting out of low chair. Some relief with ibuprofen. No knee swelling.  No trouble urinating, bowel movements.   Continues to have 'grabbing pain' with deep breaths. From fall in 2018.   PsA- [facility]  GERD- no trouble swallowing, pain with swallowing. NO symptoms on prilosec.   Depression- feels well on zoloft. Sleeping well. No si/hi.  HTN- complaint with medication. Denies exertional chest pain or pressure, numbness or tingling radiating to left arm or jaw, palpitations, dizziness, frequent headaches, changes in vision, or shortness of breath.      Colorectal Cancer Screening: Due, Cologuard negative 2018 Breast Cancer Screening: Mammogram due Cervical Cancer Screening: History of hysterectomy. Per patient, no cervix, ovaries. No vaginal bleeding , pelvic pain.  Bone Health screening/DEXA for 65+: due Lung Cancer Screening: Doesn't have 30 year pack year history and age > 53 years.       Tetanus - utd       Labs: Screening labs today. Exercise: No regular exercise. However busy around house.  Alcohol use: Occasional Smoking/tobacco use: Nonsmoker.  Follows with Dr Renda Rolls, Dermatology  HISTORY:  Past Medical History:  Diagnosis Date  . Anxiety   . Arthritis   . Cataract   . GERD (gastroesophageal reflux disease)   . Hypertension   .  Hypothyroidism   . Psoriasis    Dr. Nehemiah Massed, on embrel  . Sleep apnea     Past Surgical History:  Procedure Laterality Date  . ABDOMINAL HYSTERECTOMY  1999   for abnormal pap, TVH. No ovaries or cervix.   Marland Kitchen BREAST EXCISIONAL BIOPSY Left 1981   benign  . CATARACT EXTRACTION Bilateral   . THYROIDECTOMY  1976  . VITRECTOMY Right 2014   Family History  Problem Relation Age of Onset  . Breast cancer Mother 53  . Lung cancer Mother   . Macular degeneration Mother   . Sudden death Father        committed suicide  . Diabetes Father   . Lung disease Sister        legionaires  . Hepatitis C Brother   . Colon cancer Neg Hx       ALLERGIES: Penicillins  Current Outpatient Medications on File Prior to Visit  Medication Sig Dispense Refill  . atenolol (TENORMIN) 25 MG tablet TAKE 1 TABLET BY MOUTH EVERY DAY 90 tablet 0  . golimumab (SIMPONI ARIA) 50 MG/4ML SOLN injection Inject into the vein.    Marland Kitchen levothyroxine (SYNTHROID) 125 MCG tablet Take 1 tablet (125 mcg total) by mouth daily. 90 tablet 0  . omeprazole (PRILOSEC) 40 MG capsule TAKE 1 CAPSULE BY MOUTH EVERY DAY 90 capsule 3  . sertraline (ZOLOFT) 50 MG tablet Take 1 tablet (50 mg total) by mouth daily. 90 tablet 0  . triamterene-hydrochlorothiazide (MAXZIDE-25) 37.5-25 MG  tablet Take 1 tablet by mouth daily. 90 tablet 0   No current facility-administered medications on file prior to visit.    Social History   Tobacco Use  . Smoking status: Never Smoker  . Smokeless tobacco: Never Used  Substance Use Topics  . Alcohol use: Yes    Comment: socially  . Drug use: No    Review of Systems  Constitutional: Negative for chills, fever and unexpected weight change.  HENT: Negative for congestion and trouble swallowing.   Respiratory: Negative for cough, shortness of breath and wheezing.   Cardiovascular: Positive for leg swelling (right). Negative for chest pain and palpitations.  Gastrointestinal: Negative for nausea and  vomiting.  Musculoskeletal: Positive for arthralgias. Negative for joint swelling and myalgias.  Skin: Negative for rash.  Neurological: Positive for numbness (right leg). Negative for headaches.  Hematological: Negative for adenopathy.  Psychiatric/Behavioral: Negative for confusion, sleep disturbance and suicidal ideas.      Objective:    BP 104/70   Pulse 73   Temp (!) 96.8 F (36 C) (Temporal)   Ht 5\' 3"  (1.6 m)   Wt 186 lb 12.8 oz (84.7 kg)   SpO2 97%   BMI 33.09 kg/m   BP Readings from Last 3 Encounters:  01/19/20 104/70  09/02/18 118/70  05/31/18 128/64   Wt Readings from Last 3 Encounters:  01/19/20 186 lb 12.8 oz (84.7 kg)  09/02/18 197 lb 1.9 oz (89.4 kg)  05/31/18 199 lb 1.9 oz (90.3 kg)    Physical Exam Vitals reviewed.  Constitutional:      Appearance: She is well-developed.  Eyes:     Conjunctiva/sclera: Conjunctivae normal.  Neck:     Thyroid: No thyroid mass or thyromegaly.  Cardiovascular:     Rate and Rhythm: Normal rate and regular rhythm.     Pulses: Normal pulses.     Heart sounds: Normal heart sounds.     Comments: No LE edema, palpable cords or masses. No erythema or increased warmth. No asymmetry in calf size when compared bilaterally LE hair growth symmetric and present. No discoloration of varicosities noted. LE warm and palpable pedal pulses.  Pulmonary:     Effort: Pulmonary effort is normal.     Breath sounds: Normal breath sounds. No wheezing, rhonchi or rales.  Chest:     Chest wall: No mass, tenderness or crepitus.     Breasts: Breasts are symmetrical.        Right: No inverted nipple, mass, nipple discharge, skin change or tenderness.        Left: No inverted nipple, mass, nipple discharge, skin change or tenderness.  Musculoskeletal:     Lumbar back: No swelling, edema, spasms, tenderness or bony tenderness. Normal range of motion.     Right knee: No swelling.     Left knee: No swelling.     Right lower leg: No edema.      Left lower leg: No edema.     Comments: Full range of motion with flexion, tension, lateral side bends. No bony tenderness. No pain, numbness, tingling elicited with single leg raise bilaterally.   Lymphadenopathy:     Head:     Right side of head: No submental, submandibular, tonsillar, preauricular, posterior auricular or occipital adenopathy.     Left side of head: No submental, submandibular, tonsillar, preauricular, posterior auricular or occipital adenopathy.     Cervical: No cervical adenopathy.     Right cervical: No superficial, deep or posterior cervical adenopathy.  Left cervical: No superficial, deep or posterior cervical adenopathy.  Skin:    General: Skin is warm and dry.  Neurological:     Mental Status: She is alert.     Sensory: No sensory deficit.     Deep Tendon Reflexes:     Reflex Scores:      Patellar reflexes are 2+ on the right side and 2+ on the left side.    Comments: Sensation and strength intact bilateral lower extremities.  Psychiatric:        Speech: Speech normal.        Behavior: Behavior normal.        Thought Content: Thought content normal.        Assessment & Plan:   Problem List Items Addressed This Visit      Cardiovascular and Mediastinum   Hypertension    Stable, continue current regimen.        Respiratory   Pleural effusion    Continues to have right posterior rib pain with deep breaths.  Benign exam. Pending chest x-ray today.  Depending on x-ray findings, we will consult pulmonology again      Relevant Orders   DG Chest 2 View     Digestive   GERD (gastroesophageal reflux disease)    Controlled.  No alarm symptoms.         Endocrine   Hypothyroidism    Pending tsh        Other   Depression    Stable on zoloft. Will continue      Right groin pain    Discussed with patient, my suspicion for hip etiology.  Pending x-rays      Relevant Orders   DG Hip Unilat W OR W/O Pelvis 2-3 Views Right   DG Lumbar Spine  Complete   Right leg swelling    No swelling today.Low risk DVT based on wells criteria. Patient declines repeat US right leg as we did in 2019.  Discussed conservative measures including compression stockings, low-salt, elevation.  She will also try capsaicin over-the-counter.  She declines consult with orthopedics or neurology at this time.      Routine physical examination - Primary    Clinical breast exam performed today.  Patient deferred pelvic exam the absence of complaints and total hysterectomy, no cervix per patient.  Patient will schedule mammogram and bone density      Relevant Orders   DG Bone Density   Screening for colon cancer    No family history of colon cancer or personal history.  Patient will  consider Cologuard versus colonoscopy.  She will call Her insurance company in regards to this.       Other Visit Diagnoses    Encounter for screening mammogram for malignant neoplasm of breast       Relevant Orders   MM 3D SCREEN BREAST BILATERAL   Vitamin D deficiency       Relevant Orders   VITAMIN D 25 Hydroxy (Vit-D Deficiency, Fractures)   Asymptomatic menopause       Relevant Orders   DG Bone Density       I have discontinued Anderson Malta H. Barros's etodolac. I am also having her maintain her golimumab, omeprazole, atenolol, sertraline, triamterene-hydrochlorothiazide, and levothyroxine.   No orders of the defined types were placed in this encounter.   Return precautions given.   Risks, benefits, and alternatives of the medications and treatment plan prescribed today were discussed, and patient expressed understanding.   Education regarding  symptom management and diagnosis given to patient on AVS.   Continue to follow with Burnard Hawthorne, FNP for routine health maintenance.   Jennifer Patrick and I agreed with plan.   Mable Paris, FNP

## 2020-01-19 NOTE — Assessment & Plan Note (Signed)
Controlled.  No alarm symptoms.

## 2020-01-19 NOTE — Patient Instructions (Addendum)
Call me and let me know about Cologuard and if you want to do that or a colonoscopy.  Xrays at [facility] today  Trial of capsaicin for leg numbness.   Please call  and schedule your 3D mammogram, bone density scan as discussed.   [place]  [address] [place], [place]   Let me know how you are doing   Health Maintenance for Postmenopausal Women Menopause is a normal process in which your ability to get pregnant comes to an end. This process happens slowly over many months or years, usually between the ages of 67 and 89. Menopause is complete when you have missed your menstrual periods for 12 months. It is important to talk with your health care provider about some of the most common conditions that affect women after menopause (postmenopausal women). These include heart disease, cancer, and bone loss (osteoporosis). Adopting a healthy lifestyle and getting preventive care can help to promote your health and wellness. The actions you take can also lower your chances of developing some of these common conditions. What should I know about menopause? During menopause, you may get a number of symptoms, such as:  Hot flashes. These can be moderate or severe.  Night sweats.  Decrease in sex drive.  Mood swings.  Headaches.  Tiredness.  Irritability.  Memory problems.  Insomnia. Choosing to treat or not to treat these symptoms is a decision that you make with your health care provider. Do I need hormone replacement therapy?  Hormone replacement therapy is effective in treating symptoms that are caused by menopause, such as hot flashes and night sweats.  Hormone replacement carries certain risks, especially as you become older. If you are thinking about using estrogen or estrogen with progestin, discuss the benefits and risks with your health care provider. What is my risk for heart disease and stroke? The risk of heart disease,  heart attack, and stroke increases as you age. One of the causes may be a change in the body's hormones during menopause. This can affect how your body uses dietary fats, triglycerides, and cholesterol. Heart attack and stroke are medical emergencies. There are many things that you can do to help prevent heart disease and stroke. Watch your blood pressure  High blood pressure causes heart disease and increases the risk of stroke. This is more likely to develop in people who have high blood pressure readings, are of African descent, or are overweight.  Have your blood pressure checked: ? Every 3-5 years if you are 7-35 years of age. ? Every year if you are 22 years old or older. Eat a healthy diet   Eat a diet that includes plenty of vegetables, fruits, low-fat dairy products, and lean protein.  Do not eat a lot of foods that are high in solid fats, added sugars, or sodium. Get regular exercise Get regular exercise. This is one of the most important things you can do for your health. Most adults should:  Try to exercise for at least 150 minutes each week. The exercise should increase your heart rate and make you sweat (moderate-intensity exercise).  Try to do strengthening exercises at least twice each week. Do these in addition to the moderate-intensity exercise.  Spend less time sitting. Even light physical activity can be beneficial. Other tips  Work with your health care provider to achieve or maintain a healthy weight.  Do not use any products that contain nicotine or tobacco, such as cigarettes,  e-cigarettes, and chewing tobacco. If you need help quitting, ask your health care provider.  Know your numbers. Ask your health care provider to check your cholesterol and your blood sugar (glucose). Continue to have your blood tested as directed by your health care provider. Do I need screening for cancer? Depending on your health history and family history, you may need to have cancer  screening at different stages of your life. This may include screening for:  Breast cancer.  Cervical cancer.  Lung cancer.  Colorectal cancer. What is my risk for osteoporosis? After menopause, you may be at increased risk for osteoporosis. Osteoporosis is a condition in which bone destruction happens more quickly than new bone creation. To help prevent osteoporosis or the bone fractures that can happen because of osteoporosis, you may take the following actions:  If you are 21-5 years old, get at least 1,000 mg of calcium and at least 600 mg of vitamin D per day.  If you are older than age 45 but younger than age 59, get at least 1,200 mg of calcium and at least 600 mg of vitamin D per day.  If you are older than age 69, get at least 1,200 mg of calcium and at least 800 mg of vitamin D per day. Smoking and drinking excessive alcohol increase the risk of osteoporosis. Eat foods that are rich in calcium and vitamin D, and do weight-bearing exercises several times each week as directed by your health care provider. How does menopause affect my mental health? Depression may occur at any age, but it is more common as you become older. Common symptoms of depression include:  Low or sad mood.  Changes in sleep patterns.  Changes in appetite or eating patterns.  Feeling an overall lack of motivation or enjoyment of activities that you previously enjoyed.  Frequent crying spells. Talk with your health care provider if you think that you are experiencing depression. General instructions See your health care provider for regular wellness exams and vaccines. This may include:  Scheduling regular health, dental, and eye exams.  Getting and maintaining your vaccines. These include: ? Influenza vaccine. Get this vaccine each year before the flu season begins. ? Pneumonia vaccine. ? Shingles vaccine. ? Tetanus, diphtheria, and pertussis (Tdap) booster vaccine. Your health care provider  may also recommend other immunizations. Tell your health care provider if you have ever been abused or do not feel safe at home. Summary  Menopause is a normal process in which your ability to get pregnant comes to an end.  This condition causes hot flashes, night sweats, decreased interest in sex, mood swings, headaches, or lack of sleep.  Treatment for this condition may include hormone replacement therapy.  Take actions to keep yourself healthy, including exercising regularly, eating a healthy diet, watching your weight, and checking your blood pressure and blood sugar levels.  Get screened for cancer and depression. Make sure that you are up to date with all your vaccines. This information is not intended to replace advice given to you by your health care provider. Make sure you discuss any questions you have with your health care provider. Document Revised: 08/28/2018 Document Reviewed: 08/28/2018 Elsevier Patient Education  2020 [employer].

## 2020-01-19 NOTE — Assessment & Plan Note (Signed)
Stable, continue current regimen 

## 2020-01-19 NOTE — Assessment & Plan Note (Signed)
Continues to have right posterior rib pain with deep breaths.  Benign exam. Pending chest x-ray today.  Depending on x-ray findings, we will consult pulmonology again

## 2020-01-19 NOTE — Assessment & Plan Note (Signed)
Clinical breast exam performed today.  Patient deferred pelvic exam the absence of complaints and total hysterectomy, no cervix per patient.  Patient will schedule mammogram and bone density

## 2020-01-20 ENCOUNTER — Other Ambulatory Visit: Payer: Self-pay | Admitting: Family

## 2020-01-20 DIAGNOSIS — E039 Hypothyroidism, unspecified: Secondary | ICD-10-CM

## 2020-01-21 ENCOUNTER — Other Ambulatory Visit: Payer: Self-pay | Admitting: Family

## 2020-01-21 DIAGNOSIS — J90 Pleural effusion, not elsewhere classified: Secondary | ICD-10-CM

## 2020-02-13 ENCOUNTER — Telehealth: Payer: Self-pay

## 2020-02-13 NOTE — Telephone Encounter (Signed)
Spoken to patient, she stated she wants to wait on the cologuard test for now. Patient is due for cologuard.

## 2020-02-13 NOTE — Telephone Encounter (Signed)
noted 

## 2020-02-17 ENCOUNTER — Other Ambulatory Visit: Payer: Medicare Other

## 2020-02-19 DIAGNOSIS — L4050 Arthropathic psoriasis, unspecified: Secondary | ICD-10-CM | POA: Diagnosis not present

## 2020-02-26 ENCOUNTER — Other Ambulatory Visit (INDEPENDENT_AMBULATORY_CARE_PROVIDER_SITE_OTHER): Payer: Medicare Other

## 2020-02-26 ENCOUNTER — Other Ambulatory Visit: Payer: Self-pay

## 2020-02-26 DIAGNOSIS — E039 Hypothyroidism, unspecified: Secondary | ICD-10-CM | POA: Diagnosis not present

## 2020-02-26 LAB — TSH: TSH: 0.24 u[IU]/mL — ABNORMAL LOW (ref 0.35–4.50)

## 2020-02-29 ENCOUNTER — Other Ambulatory Visit: Payer: Self-pay | Admitting: Family

## 2020-02-29 DIAGNOSIS — I10 Essential (primary) hypertension: Secondary | ICD-10-CM

## 2020-03-02 ENCOUNTER — Other Ambulatory Visit: Payer: Self-pay | Admitting: Family

## 2020-03-02 DIAGNOSIS — E039 Hypothyroidism, unspecified: Secondary | ICD-10-CM

## 2020-03-23 ENCOUNTER — Other Ambulatory Visit: Payer: Self-pay | Admitting: Family

## 2020-03-24 ENCOUNTER — Encounter: Payer: Self-pay | Admitting: Pulmonary Disease

## 2020-03-24 ENCOUNTER — Ambulatory Visit: Payer: Medicare Other | Admitting: Pulmonary Disease

## 2020-03-24 ENCOUNTER — Other Ambulatory Visit: Payer: Self-pay

## 2020-03-24 VITALS — BP 110/68 | HR 69 | Temp 97.6°F | Ht 63.0 in | Wt 187.2 lb

## 2020-03-24 DIAGNOSIS — J986 Disorders of diaphragm: Secondary | ICD-10-CM

## 2020-03-24 DIAGNOSIS — M8580 Other specified disorders of bone density and structure, unspecified site: Secondary | ICD-10-CM

## 2020-03-24 DIAGNOSIS — Z780 Asymptomatic menopausal state: Secondary | ICD-10-CM

## 2020-03-24 NOTE — Progress Notes (Signed)
    Assessment & Plan:  1. Acquired elevated diaphragm (Primary)  2. Osteopenia after menopause   Patient Instructions  Follow-up as needed  Please note: late entry documentation due to logistical difficulties during COVID-19 pandemic. This note is filed for information purposes only, and is not intended to be used for billing, nor does it represent the full scope/nature of the visit in question. Please see any associated scanned media linked to date of encounter for additional pertinent information.  Subjective:    HPI: Jennifer Patrick is a 69 y.o. female presenting to the pulmonology clinic on 03/24/2020 with report of: pulmonary consult (per [name]- CXR 01/19/2020. no current sx. )     Outpatient Encounter Medications as of 03/24/2020  Medication Sig   [DISCONTINUED] atenolol  (TENORMIN ) 25 MG tablet TAKE 1 TABLET BY MOUTH EVERY DAY   [DISCONTINUED] golimumab  (SIMPONI  ARIA) 50 MG/4ML SOLN injection Inject into the vein.   [DISCONTINUED] levothyroxine  (SYNTHROID ) 125 MCG tablet TAKE 1 TABLET BY MOUTH EVERY DAY   [DISCONTINUED] omeprazole  (PRILOSEC) 40 MG capsule TAKE 1 CAPSULE BY MOUTH EVERY DAY   [DISCONTINUED] sertraline  (ZOLOFT ) 50 MG tablet TAKE 1 TABLET BY MOUTH EVERY DAY   [DISCONTINUED] triamterene -hydrochlorothiazide (MAXZIDE-25) 37.5-25 MG tablet TAKE 1 TABLET BY MOUTH EVERY DAY   No facility-administered encounter medications on file as of 03/24/2020.      Objective:   Vitals:   03/24/20 0959  BP: 110/68  Pulse: 69  Temp: 97.6 F (36.4 C)  Height: 5' 3 (1.6 m)  Weight: 187 lb 3.2 oz (84.9 kg)  SpO2: 99%  TempSrc: Temporal  BMI (Calculated): 33.17     Physical exam documentation is limited by delayed entry of information.

## 2020-03-24 NOTE — Patient Instructions (Signed)
Follow up as needed

## 2020-04-15 DIAGNOSIS — L4050 Arthropathic psoriasis, unspecified: Secondary | ICD-10-CM | POA: Diagnosis not present

## 2020-05-04 ENCOUNTER — Encounter: Payer: Self-pay | Admitting: Family

## 2020-05-11 DIAGNOSIS — H353134 Nonexudative age-related macular degeneration, bilateral, advanced atrophic with subfoveal involvement: Secondary | ICD-10-CM | POA: Diagnosis not present

## 2020-05-11 DIAGNOSIS — H35033 Hypertensive retinopathy, bilateral: Secondary | ICD-10-CM | POA: Diagnosis not present

## 2020-05-11 DIAGNOSIS — H542X21 Low vision right eye category 2, low vision left eye category 1: Secondary | ICD-10-CM | POA: Diagnosis not present

## 2020-05-12 DIAGNOSIS — L400 Psoriasis vulgaris: Secondary | ICD-10-CM | POA: Diagnosis not present

## 2020-06-01 ENCOUNTER — Other Ambulatory Visit: Payer: Self-pay | Admitting: Family

## 2020-06-01 DIAGNOSIS — I10 Essential (primary) hypertension: Secondary | ICD-10-CM

## 2020-06-03 ENCOUNTER — Ambulatory Visit (INDEPENDENT_AMBULATORY_CARE_PROVIDER_SITE_OTHER): Payer: Medicare Other

## 2020-06-03 VITALS — Ht 63.0 in | Wt 187.0 lb

## 2020-06-03 DIAGNOSIS — Z6833 Body mass index (BMI) 33.0-33.9, adult: Secondary | ICD-10-CM | POA: Diagnosis not present

## 2020-06-03 DIAGNOSIS — E669 Obesity, unspecified: Secondary | ICD-10-CM | POA: Diagnosis not present

## 2020-06-03 DIAGNOSIS — L401 Generalized pustular psoriasis: Secondary | ICD-10-CM | POA: Diagnosis not present

## 2020-06-03 DIAGNOSIS — R7989 Other specified abnormal findings of blood chemistry: Secondary | ICD-10-CM | POA: Diagnosis not present

## 2020-06-03 DIAGNOSIS — Z0000 Encounter for general adult medical examination without abnormal findings: Secondary | ICD-10-CM | POA: Diagnosis not present

## 2020-06-03 DIAGNOSIS — L4050 Arthropathic psoriasis, unspecified: Secondary | ICD-10-CM | POA: Diagnosis not present

## 2020-06-03 NOTE — Progress Notes (Addendum)
Subjective:   Jennifer Patrick is a 69 y.o. female who presents for Medicare Annual (Subsequent) preventive examination.  Review of Systems    No ROS.  Medicare Wellness Virtual Visit.   Cardiac Risk Factors include: advanced age (>1men, >67 women);hypertension     Objective:    Today's Vitals   06/03/20 0932  Weight: 187 lb (84.8 kg)  Height: 5\' 3"  (1.6 m)   Body mass index is 33.13 kg/m.  Advanced Directives 06/03/2020 05/31/2018 12/16/2014  Does Patient Have a Medical Advance Directive? No No No  Would patient like information on creating a medical advance directive? No - Patient declined Yes (MAU/Ambulatory/Procedural Areas - Information given) No - patient declined information    Current Medications (verified) Outpatient Encounter Medications as of 06/03/2020  Medication Sig   atenolol (TENORMIN) 25 MG tablet TAKE 1 TABLET BY MOUTH EVERY DAY   golimumab (SIMPONI ARIA) 50 MG/4ML SOLN injection Inject into the vein.   levothyroxine (SYNTHROID) 125 MCG tablet TAKE 1 TABLET BY MOUTH EVERY DAY   mometasone (ELOCON) 0.1 % ointment Apply topically 2 (two) times daily as needed.   omeprazole (PRILOSEC) 40 MG capsule TAKE 1 CAPSULE BY MOUTH EVERY DAY   PREVIDENT 5000 DRY MOUTH 1.1 % GEL dental gel PLEASE SEE ATTACHED FOR DETAILED DIRECTIONS   sertraline (ZOLOFT) 50 MG tablet TAKE 1 TABLET BY MOUTH EVERY DAY   triamterene-hydrochlorothiazide (MAXZIDE-25) 37.5-25 MG tablet TAKE 1 TABLET BY MOUTH EVERY DAY   No facility-administered encounter medications on file as of 06/03/2020.    Allergies (verified) Penicillins   History: Past Medical History:  Diagnosis Date   Anxiety    Arthritis    Cataract    GERD (gastroesophageal reflux disease)    Hypertension    Hypothyroidism    Psoriasis    Dr. Nehemiah Massed, on embrel   Sleep apnea    Past Surgical History:  Procedure Laterality Date   ABDOMINAL HYSTERECTOMY  1999   for abnormal pap, TV[facility]. No ovaries or  cervix.    BREAST EXCISIONAL BIOPSY Left 1981   benign   CATARACT EXTRACTION Bilateral    THYROIDECTOMY  1976   VITRECTOMY Right 2014   Family History  Problem Relation Age of Onset   Breast cancer Mother 37   Lung cancer Mother    Macular degeneration Mother    Sudden death Father        committed suicide   Diabetes Father    Lung disease Sister        legionaires   Hepatitis C Brother    Colon cancer Neg Hx    Social History   Socioeconomic History   Marital status: Widowed    Spouse name: Not on file   Number of children: 2   Years of education: Not on file   Highest education level: Not on file  Occupational History   Occupation: retired  Tobacco Use   Smoking status: Never Smoker   Smokeless tobacco: Never Used  Scientific laboratory technician Use: Never used  Substance and Sexual Activity   Alcohol use: Yes    Comment: socially   Drug use: No   Sexual activity: Not on file  Other Topics Concern   Not on file  Social History Narrative   Works at [employer], 2014. Lives in [place].   Enjoys going to [employer]. Grandchildren in [place].       Has son and daughter.   Lives alone, widow since 2012.  Social Determinants of Health   Financial Resource Strain: Low Risk    Difficulty of Paying Living Expenses: Not hard at all  Food Insecurity: No Food Insecurity   Worried About Charity fundraiser in the Last Year: Never true   [place] in the Last Year: Never true  Transportation Needs: No Transportation Needs   Lack of Transportation (Medical): No   Lack of Transportation (Non-Medical): No  Physical Activity:    Days of Exercise per Week: Not on file   Minutes of Exercise per Session: Not on file  Stress: No Stress Concern Present   Feeling of Stress : Not at all  Social Connections: Unknown   Frequency of Communication with Friends and Family: Not on file   Frequency of Social Gatherings with  Friends and Family: More than three times a week   Attends Religious Services: Not on file   Active Member of Clubs or Organizations: Yes   Attends Archivist Meetings: More than 4 times per year   Marital Status: Widowed    Tobacco Counseling Counseling given: Not Answered   Clinical Intake:  Pre-visit preparation completed: Yes        Diabetes: No  How often do you need to have someone help you when you read instructions, pamphlets, or other written materials from your doctor or pharmacy?: 1 - Never  Interpreter Needed?: No      Activities of Daily Living In your present state of health, do you have any difficulty performing the following activities: 06/03/2020  Hearing? N  Vision? N  Difficulty concentrating or making decisions? N  Walking or climbing stairs? N  Dressing or bathing? N  Doing errands, shopping? N  Preparing Food and eating ? N  Using the Toilet? N  In the past six months, have you accidently leaked urine? N  Do you have problems with loss of bowel control? N  Managing your Medications? N  Managing your Finances? N  Housekeeping or managing your Housekeeping? N  Some recent data might be hidden    Patient Care Team: Burnard Hawthorne, FNP as PCP - General (Family Medicine) Jackolyn Confer, MD (Internal Medicine)  Indicate any recent Medical Services you may have received from other than [facility] providers in the past year (date may be approximate).     Assessment:   This is a routine wellness examination for Jennifer Patrick.  I connected with Jennings today by telephone and verified that I am speaking with the correct person using two identifiers. Location patient: home Location provider: work Persons participating in the virtual visit: patient, Marine scientist.    I discussed the limitations, risks, security and privacy concerns of performing an evaluation and management service by telephone and the availability of in person appointments.  The patient expressed understanding and verbally consented to this telephonic visit.    Interactive audio and video telecommunications were attempted between this provider and patient, however failed, due to patient having technical difficulties OR patient did not have access to video capability.  We continued and completed visit with audio only.  Some vital signs may be absent or patient reported.   Hearing/Vision screen  Hearing Screening   125Hz  250Hz  500Hz  1000Hz  2000Hz  3000Hz  4000Hz  6000Hz  8000Hz   Right ear:           Left ear:           Comments: Patient is able to hear conversational tones without difficulty.  No issues reported.  Vision Screening  Comments: Followed by [facility] Wears corrective lenses Cataract extraction, bilateral Visual acuity not assessed, virtual visit.  They have seen their ophthalmologist in the last 12 months.     Dietary issues and exercise activities discussed: Current Exercise Habits: Home exercise routine, Type of exercise: walking, Intensity: Mild  Goals      Patient Stated     Maintain Healthy Lifestyle (pt-stated)      Stay active Healthy diet Stay hydrated      Depression Screen PHQ 2/9 Scores 06/03/2020 06/03/2019 09/02/2018 05/31/2018 10/25/2016 07/19/2016 12/16/2014  PHQ - 2 Score 0 0 0 0 0 0 0  PHQ- 9 Score - - 1 - - - -    Fall Risk Fall Risk  06/03/2020 01/19/2020 06/03/2019 05/31/2018 10/25/2016  Falls in the past year? 0 0 0 No Yes  Number falls in past yr: 0 - - - 1  Injury with Fall? - - - - Yes  Risk for fall due to : - - - - -  Risk for fall due to: Comment - - - - -  Follow up Falls evaluation completed Falls evaluation completed - - -   Handrails in use when climbing stairs? Yes Home free of loose throw rugs in walkways, pet beds, electrical cords, etc? Yes  Adequate lighting in your home to reduce risk of falls? Yes   ASSISTIVE DEVICES UTILIZED TO PREVENT FALLS: Life alert? No  Use of a cane, walker or w/c? No     TIMED UP AND GO: Was the test performed? No . Virtual visit.   Cognitive Function: Patient is alert and oriented x3.  Denies difficulty focusing, making decisions, memory loss.   MMSE - Mini Mental State Exam 05/31/2018 12/16/2014  Orientation to time 5 5  Orientation to Place 5 5  Registration 3 3  Attention/ Calculation 5 5  Recall 3 3  Language- name 2 objects 2 2  Language- repeat 1 1  Language- follow 3 step command 3 3  Language- read & follow direction 1 1  Write a sentence 1 1  Copy design 1 1  Total score 30 30     6CIT Screen 06/03/2019  What Year? 0 points  What month? 0 points  What time? 0 points  Count back from 20 0 points  Months in reverse 0 points  Repeat phrase 0 points  Total Score 0    Immunizations Immunization History  Administered Date(s) Administered   Fluad Quad(high Dose 65+) 06/10/2019   Influenza Whole 07/03/2011, 07/07/2012   Influenza,inj,Quad PF,6+ Mos 07/14/2015   Influenza-Unspecified 07/14/2013, 06/19/2016, 06/10/2017, 06/03/2019   Moderna SARS-COVID-2 Vaccination 10/25/2019, 11/24/2019   Pneumococcal Conjugate-13 12/12/2013, 06/10/2019   Pneumococcal Polysaccharide-23 12/16/2014   Tdap 07/07/2010   Zoster 10/05/2011   Health Maintenance Health Maintenance  Topic Date Due   MAMMOGRAM  08/14/2019   Fecal DNA (Cologuard)  06/03/2021 (Originally 12/08/2019)   TETANUS/TDAP  07/07/2020   DEXA SCAN  Completed   COVID-19 Vaccine  Completed   Hepatitis C Screening  Completed   PNA vac Low Risk Adult  Completed   INFLUENZA VACCINE  Discontinued   Mammogram- plans to schedule later in the season.   Cologuard- plans to discuss with her insurance prior to ordering. Deferred.   Dental Screening: Recommended annual dental exams for proper oral hygiene. [place] Resource Referral / Chronic Care Management: CRR required this visit?  No   CCM required this visit?  No  Plan:   Keep  all routine maintenance appointments.   Follow up 01/19/21 @ 8:30  I have personally reviewed and noted the following in the patients chart:    Medical and social history  Use of alcohol, tobacco or illicit drugs   Current medications and supplements  Functional ability and status  Nutritional status  Physical activity  Advanced directives  List of other physicians  Hospitalizations, surgeries, and ER visits in previous 12 months  Vitals  Screenings to include cognitive, depression, and falls  Referrals and appointments  In addition, I have reviewed and discussed with patient certain preventive protocols, quality metrics, and best practice recommendations. A written personalized care plan for preventive services as well as general preventive health recommendations were provided to patient via mychart.     Varney Biles, LPN   8/00/3491     Agree with plan. Mable Paris, NP

## 2020-06-03 NOTE — Patient Instructions (Addendum)
Jennifer Patrick , Thank you for taking time to come for your Medicare Wellness Visit. I appreciate your ongoing commitment to your health goals. Please review the following plan we discussed and let me know if I can assist you in the future.   These are the goals we discussed: Goals      Patient Stated   .  Maintain Healthy Lifestyle (pt-stated)      Stay active Healthy diet Stay hydrated       This is a list of the screening recommended for you and due dates:  Health Maintenance  Topic Date Due  . Mammogram  08/14/2019  . Cologuard (Stool DNA test)  06/03/2021*  . Tetanus Vaccine  07/07/2020  . DEXA scan (bone density measurement)  Completed  . COVID-19 Vaccine  Completed  .  Hepatitis C: One time screening is recommended by Center for Disease Control  (CDC) for  adults born from [birth year] through 1967.   Completed  . Pneumonia vaccines  Completed  . Flu Shot  Discontinued  *Topic was postponed. The date shown is not the original due date.    Immunizations Immunization History  Administered Date(s) Administered  . Fluad Quad(high Dose 65+) 06/10/2019  . Influenza Whole 07/03/2011, 07/07/2012  . Influenza,inj,Quad PF,6+ Mos 07/14/2015  . Influenza-Unspecified 07/14/2013, 06/19/2016, 06/10/2017, 06/03/2019  . Moderna SARS-COVID-2 Vaccination 10/25/2019, 11/24/2019  . Pneumococcal Conjugate-13 12/12/2013, 06/10/2019  . Pneumococcal Polysaccharide-23 12/16/2014  . Tdap 07/07/2010  . Zoster 10/05/2011   Keep all routine maintenance appointments.   Follow up 01/19/21 @ 8:30  Advanced directives: declined  Conditions/risks identified: none new.  Follow up in one year for your annual wellness visit.   Preventive Care 17 Years and Older, Female Preventive care refers to lifestyle choices and visits with your health care provider that can promote health and wellness. What does preventive care include?  A yearly physical exam. This is also called an annual well check.  Dental  exams once or twice a year.  Routine eye exams. Ask your health care provider how often you should have your eyes checked.  Personal lifestyle choices, including:  Daily care of your teeth and gums.  Regular physical activity.  Eating a healthy diet.  Avoiding tobacco and drug use.  Limiting alcohol use.  Practicing safe sex.  Taking low-dose aspirin every day.  Taking vitamin and mineral supplements as recommended by your health care provider. What happens during an annual well check? The services and screenings done by your health care provider during your annual well check will depend on your age, overall health, lifestyle risk factors, and family history of disease. Counseling  Your health care provider may ask you questions about your:  Alcohol use.  Tobacco use.  Drug use.  Emotional well-being.  Home and relationship well-being.  Sexual activity.  Eating habits.  History of falls.  Memory and ability to understand (cognition).  Work and work Statistician.  Reproductive health. Screening  You may have the following tests or measurements:  Height, weight, and BMI.  Blood pressure.  Lipid and cholesterol levels. These may be checked every 5 years, or more frequently if you are over 90 years old.  Skin check.  Lung cancer screening. You may have this screening every year starting at age 9 if you have a 30-pack-year history of smoking and currently smoke or have quit within the past 15 years.  Fecal occult blood test (FOBT) of the stool. You may have this test every year starting at  age 42.  Flexible sigmoidoscopy or colonoscopy. You may have a sigmoidoscopy every 5 years or a colonoscopy every 10 years starting at age 63.  Hepatitis C blood test.  Hepatitis B blood test.  Sexually transmitted disease (STD) testing.  Diabetes screening. This is done by checking your blood sugar (glucose) after you have not eaten for a while (fasting). You may  have this done every 1-3 years.  Bone density scan. This is done to screen for osteoporosis. You may have this done starting at age 33.  Mammogram. This may be done every 1-2 years. Talk to your health care provider about how often you should have regular mammograms. Talk with your health care provider about your test results, treatment options, and if necessary, the need for more tests. Vaccines  Your health care provider may recommend certain vaccines, such as:  Influenza vaccine. This is recommended every year.  Tetanus, diphtheria, and acellular pertussis (Tdap, Td) vaccine. You may need a Td booster every 10 years.  Zoster vaccine. You may need this after age 73.  Pneumococcal 13-valent conjugate (PCV13) vaccine. One dose is recommended after age 84.  Pneumococcal polysaccharide (PPSV23) vaccine. One dose is recommended after age 61. Talk to your health care provider about which screenings and vaccines you need and how often you need them. This information is not intended to replace advice given to you by your health care provider. Make sure you discuss any questions you have with your health care provider. Document Released: 10/01/2015 Document Revised: 05/24/2016 Document Reviewed: 07/06/2015 Elsevier Interactive Patient Education  2017 [place] Prevention in the Home Falls can cause injuries. They can happen to people of all ages. There are many things you can do to make your home safe and to help prevent falls. What can I do on the outside of my home?  Regularly fix the edges of walkways and driveways and fix any cracks.  Remove anything that might make you trip as you walk through a door, such as a raised step or threshold.  Trim any bushes or trees on the path to your home.  Use bright outdoor lighting.  Clear any walking paths of anything that might make someone trip, such as rocks or tools.  Regularly check to see if handrails are loose or broken. Make  sure that both sides of any steps have handrails.  Any raised decks and porches should have guardrails on the edges.  Have any leaves, snow, or ice cleared regularly.  Use sand or salt on walking paths during winter.  Clean up any spills in your garage right away. This includes oil or grease spills. What can I do in the bathroom?  Use night lights.  Install grab bars by the toilet and in the tub and shower. Do not use towel bars as grab bars.  Use non-skid mats or decals in the tub or shower.  If you need to sit down in the shower, use a plastic, non-slip stool.  Keep the floor dry. Clean up any water that spills on the floor as soon as it happens.  Remove soap buildup in the tub or shower regularly.  Attach bath mats securely with double-sided non-slip rug tape.  Do not have throw rugs and other things on the floor that can make you trip. What can I do in the bedroom?  Use night lights.  Make sure that you have a light by your bed that is easy to reach.  Do not use any  sheets or blankets that are too big for your bed. They should not hang down onto the floor.  Have a firm chair that has side arms. You can use this for support while you get dressed.  Do not have throw rugs and other things on the floor that can make you trip. What can I do in the kitchen?  Clean up any spills right away.  Avoid walking on wet floors.  Keep items that you use a lot in easy-to-reach places.  If you need to reach something above you, use a strong step stool that has a grab bar.  Keep electrical cords out of the way.  Do not use floor polish or wax that makes floors slippery. If you must use wax, use non-skid floor wax.  Do not have throw rugs and other things on the floor that can make you trip. What can I do with my stairs?  Do not leave any items on the stairs.  Make sure that there are handrails on both sides of the stairs and use them. Fix handrails that are broken or loose.  Make sure that handrails are as long as the stairways.  Check any carpeting to make sure that it is firmly attached to the stairs. Fix any carpet that is loose or worn.  Avoid having throw rugs at the top or bottom of the stairs. If you do have throw rugs, attach them to the floor with carpet tape.  Make sure that you have a light switch at the top of the stairs and the bottom of the stairs. If you do not have them, ask someone to add them for you. What else can I do to help prevent falls?  Wear shoes that:  Do not have high heels.  Have rubber bottoms.  Are comfortable and fit you well.  Are closed at the toe. Do not wear sandals.  If you use a stepladder:  Make sure that it is fully opened. Do not climb a closed stepladder.  Make sure that both sides of the stepladder are locked into place.  Ask someone to hold it for you, if possible.  Clearly mark and make sure that you can see:  Any grab bars or handrails.  First and last steps.  Where the edge of each step is.  Use tools that help you move around (mobility aids) if they are needed. These include:  Canes.  Walkers.  Scooters.  Crutches.  Turn on the lights when you go into a dark area. Replace any light bulbs as soon as they burn out.  Set up your furniture so you have a clear path. Avoid moving your furniture around.  If any of your floors are uneven, fix them.  If there are any pets around you, be aware of where they are.  Review your medicines with your doctor. Some medicines can make you feel dizzy. This can increase your chance of falling. Ask your doctor what other things that you can do to help prevent falls. This information is not intended to replace advice given to you by your health care provider. Make sure you discuss any questions you have with your health care provider. Document Released: 07/01/2009 Document Revised: 02/10/2016 Document Reviewed: 10/09/2014 Elsevier Interactive Patient  Education  2017 [employer].

## 2020-06-13 ENCOUNTER — Other Ambulatory Visit: Payer: Self-pay | Admitting: Family

## 2020-06-14 DIAGNOSIS — L4050 Arthropathic psoriasis, unspecified: Secondary | ICD-10-CM | POA: Diagnosis not present

## 2020-07-11 DIAGNOSIS — Z23 Encounter for immunization: Secondary | ICD-10-CM | POA: Diagnosis not present

## 2020-08-09 DIAGNOSIS — L4050 Arthropathic psoriasis, unspecified: Secondary | ICD-10-CM | POA: Diagnosis not present

## 2020-08-27 DIAGNOSIS — R17 Unspecified jaundice: Secondary | ICD-10-CM | POA: Diagnosis not present

## 2020-08-27 DIAGNOSIS — K8070 Calculus of gallbladder and bile duct without cholecystitis without obstruction: Secondary | ICD-10-CM | POA: Diagnosis present

## 2020-08-27 DIAGNOSIS — K760 Fatty (change of) liver, not elsewhere classified: Secondary | ICD-10-CM | POA: Diagnosis not present

## 2020-08-27 DIAGNOSIS — N281 Cyst of kidney, acquired: Secondary | ICD-10-CM | POA: Diagnosis not present

## 2020-08-27 DIAGNOSIS — R1010 Upper abdominal pain, unspecified: Secondary | ICD-10-CM | POA: Diagnosis not present

## 2020-08-27 DIAGNOSIS — K8020 Calculus of gallbladder without cholecystitis without obstruction: Secondary | ICD-10-CM | POA: Diagnosis not present

## 2020-08-27 DIAGNOSIS — I10 Essential (primary) hypertension: Secondary | ICD-10-CM | POA: Diagnosis present

## 2020-08-27 DIAGNOSIS — R140 Abdominal distension (gaseous): Secondary | ICD-10-CM | POA: Diagnosis not present

## 2020-08-27 DIAGNOSIS — E039 Hypothyroidism, unspecified: Secondary | ICD-10-CM | POA: Diagnosis not present

## 2020-08-27 DIAGNOSIS — K449 Diaphragmatic hernia without obstruction or gangrene: Secondary | ICD-10-CM | POA: Diagnosis present

## 2020-08-27 DIAGNOSIS — N390 Urinary tract infection, site not specified: Secondary | ICD-10-CM | POA: Diagnosis not present

## 2020-08-27 DIAGNOSIS — E890 Postprocedural hypothyroidism: Secondary | ICD-10-CM | POA: Diagnosis not present

## 2020-08-27 DIAGNOSIS — K2970 Gastritis, unspecified, without bleeding: Secondary | ICD-10-CM | POA: Diagnosis present

## 2020-08-27 DIAGNOSIS — K219 Gastro-esophageal reflux disease without esophagitis: Secondary | ICD-10-CM | POA: Diagnosis present

## 2020-08-27 DIAGNOSIS — R1013 Epigastric pain: Secondary | ICD-10-CM | POA: Diagnosis not present

## 2020-08-27 DIAGNOSIS — Z0189 Encounter for other specified special examinations: Secondary | ICD-10-CM | POA: Diagnosis not present

## 2020-08-27 DIAGNOSIS — K838 Other specified diseases of biliary tract: Secondary | ICD-10-CM | POA: Diagnosis not present

## 2020-08-27 DIAGNOSIS — Z20822 Contact with and (suspected) exposure to covid-19: Secondary | ICD-10-CM | POA: Diagnosis present

## 2020-08-27 DIAGNOSIS — N200 Calculus of kidney: Secondary | ICD-10-CM | POA: Diagnosis not present

## 2020-08-27 DIAGNOSIS — K839 Disease of biliary tract, unspecified: Secondary | ICD-10-CM | POA: Diagnosis not present

## 2020-08-27 DIAGNOSIS — K829 Disease of gallbladder, unspecified: Secondary | ICD-10-CM | POA: Insufficient documentation

## 2020-08-27 DIAGNOSIS — Z90710 Acquired absence of both cervix and uterus: Secondary | ICD-10-CM | POA: Diagnosis not present

## 2020-08-27 DIAGNOSIS — E876 Hypokalemia: Secondary | ICD-10-CM | POA: Diagnosis not present

## 2020-08-28 DIAGNOSIS — K8070 Calculus of gallbladder and bile duct without cholecystitis without obstruction: Secondary | ICD-10-CM | POA: Diagnosis not present

## 2020-08-29 DIAGNOSIS — K8070 Calculus of gallbladder and bile duct without cholecystitis without obstruction: Secondary | ICD-10-CM | POA: Diagnosis not present

## 2020-08-30 DIAGNOSIS — E039 Hypothyroidism, unspecified: Secondary | ICD-10-CM | POA: Diagnosis not present

## 2020-08-30 DIAGNOSIS — I10 Essential (primary) hypertension: Secondary | ICD-10-CM | POA: Diagnosis not present

## 2020-08-30 DIAGNOSIS — K8070 Calculus of gallbladder and bile duct without cholecystitis without obstruction: Secondary | ICD-10-CM | POA: Diagnosis not present

## 2020-08-30 DIAGNOSIS — Z0189 Encounter for other specified special examinations: Secondary | ICD-10-CM | POA: Diagnosis not present

## 2020-08-30 DIAGNOSIS — K8020 Calculus of gallbladder without cholecystitis without obstruction: Secondary | ICD-10-CM | POA: Diagnosis not present

## 2020-08-31 ENCOUNTER — Telehealth: Payer: Medicare Other | Admitting: Internal Medicine

## 2020-08-31 ENCOUNTER — Other Ambulatory Visit: Payer: Self-pay | Admitting: Family

## 2020-09-01 ENCOUNTER — Other Ambulatory Visit: Payer: Self-pay | Admitting: Family

## 2020-09-01 DIAGNOSIS — I10 Essential (primary) hypertension: Secondary | ICD-10-CM

## 2020-09-07 ENCOUNTER — Other Ambulatory Visit: Payer: Self-pay | Admitting: Family

## 2020-10-12 DIAGNOSIS — L4050 Arthropathic psoriasis, unspecified: Secondary | ICD-10-CM | POA: Diagnosis not present

## 2020-11-15 DIAGNOSIS — L400 Psoriasis vulgaris: Secondary | ICD-10-CM | POA: Diagnosis not present

## 2020-11-16 DIAGNOSIS — H353134 Nonexudative age-related macular degeneration, bilateral, advanced atrophic with subfoveal involvement: Secondary | ICD-10-CM | POA: Diagnosis not present

## 2020-11-16 DIAGNOSIS — H542X21 Low vision right eye category 2, low vision left eye category 1: Secondary | ICD-10-CM | POA: Diagnosis not present

## 2020-11-16 DIAGNOSIS — H35033 Hypertensive retinopathy, bilateral: Secondary | ICD-10-CM | POA: Diagnosis not present

## 2020-11-29 ENCOUNTER — Other Ambulatory Visit: Payer: Self-pay | Admitting: Family

## 2020-11-29 DIAGNOSIS — I10 Essential (primary) hypertension: Secondary | ICD-10-CM

## 2020-12-01 DIAGNOSIS — Z6833 Body mass index (BMI) 33.0-33.9, adult: Secondary | ICD-10-CM | POA: Diagnosis not present

## 2020-12-01 DIAGNOSIS — L401 Generalized pustular psoriasis: Secondary | ICD-10-CM | POA: Diagnosis not present

## 2020-12-01 DIAGNOSIS — L4050 Arthropathic psoriasis, unspecified: Secondary | ICD-10-CM | POA: Diagnosis not present

## 2020-12-01 DIAGNOSIS — R7989 Other specified abnormal findings of blood chemistry: Secondary | ICD-10-CM | POA: Diagnosis not present

## 2020-12-01 DIAGNOSIS — E669 Obesity, unspecified: Secondary | ICD-10-CM | POA: Diagnosis not present

## 2020-12-20 ENCOUNTER — Other Ambulatory Visit: Payer: Self-pay

## 2020-12-20 ENCOUNTER — Encounter: Payer: Self-pay | Admitting: Gastroenterology

## 2020-12-20 ENCOUNTER — Ambulatory Visit (INDEPENDENT_AMBULATORY_CARE_PROVIDER_SITE_OTHER): Payer: Medicare Other | Admitting: Gastroenterology

## 2020-12-20 VITALS — BP 138/79 | HR 71 | Temp 97.8°F | Ht 63.0 in | Wt 190.4 lb

## 2020-12-20 DIAGNOSIS — K219 Gastro-esophageal reflux disease without esophagitis: Secondary | ICD-10-CM | POA: Diagnosis not present

## 2020-12-20 DIAGNOSIS — Z1211 Encounter for screening for malignant neoplasm of colon: Secondary | ICD-10-CM

## 2020-12-20 DIAGNOSIS — R17 Unspecified jaundice: Secondary | ICD-10-CM | POA: Insufficient documentation

## 2020-12-20 DIAGNOSIS — R1013 Epigastric pain: Secondary | ICD-10-CM | POA: Insufficient documentation

## 2020-12-20 MED ORDER — PANTOPRAZOLE SODIUM 40 MG PO TBEC: 40.0000 mg | DELAYED_RELEASE_TABLET | Freq: Every day | ORAL | 6 refills | Status: DC

## 2020-12-20 MED ORDER — SUPREP BOWEL PREP KIT 17.5-3.13-1.6 GM/177ML PO SOLN: 1.0000 | ORAL | 0 refills | Status: DC

## 2020-12-20 NOTE — Progress Notes (Signed)
Gastroenterology Consultation  Referring Provider:     Burnard Hawthorne, FNP Primary Care Physician:  Burnard Hawthorne, FNP Primary Gastroenterologist:  Dr. Allen Norris     Reason for Consultation:     Abdominal discomfort        HPI:   Jennifer Patrick is a 70 y.o. y/o female referred for consultation & management of abdominal discomfort by Dr. Vidal Schwalbe, Yvetta Coder, FNP.  This patient comes in today with a history of see me back in November 2018 for GERD.  The patient is now sent for abdominal pain that is reported to be in her midabdomen.  The patient's GERD at last visit was reported to be well controlled with her PPI. The patient states that her symptoms are related to her GERD.  She reports that she believes that her PPI is no longer working for her.  She was taking it in the morning but then switched to the evening with still having acid breakthrough.  There is no report of any unexplained weight loss fevers chills nausea vomiting black stools or bloody stools.  She also denies any hematemesis.  Past Medical History:  Diagnosis Date  . Anxiety   . Arthritis   . Cataract   . GERD (gastroesophageal reflux disease)   . Hypertension   . Hypothyroidism   . Psoriasis    Dr. Nehemiah Massed, on embrel  . Sleep apnea     Past Surgical History:  Procedure Laterality Date  . ABDOMINAL HYSTERECTOMY  1999   for abnormal pap, TVH. No ovaries or cervix.   Marland Kitchen BREAST EXCISIONAL BIOPSY Left 1981   benign  . CATARACT EXTRACTION Bilateral   . THYROIDECTOMY  1976  . VITRECTOMY Right 2014    Prior to Admission medications   Medication Sig Start Date End Date Taking? Authorizing Provider  atenolol (TENORMIN) 25 MG tablet TAKE 1 TABLET BY MOUTH EVERY DAY 11/29/20   Burnard Hawthorne, FNP  levothyroxine (SYNTHROID) 125 MCG tablet TAKE 1 TABLET BY MOUTH EVERY DAY 11/29/20   Burnard Hawthorne, FNP  sertraline (ZOLOFT) 50 MG tablet TAKE 1 TABLET BY MOUTH EVERY DAY 11/29/20   Burnard Hawthorne, FNP   triamterene-hydrochlorothiazide (MAXZIDE-25) 37.5-25 MG tablet TAKE 1 TABLET BY MOUTH EVERY DAY 11/29/20   Burnard Hawthorne, FNP  golimumab (SIMPONI ARIA) 50 MG/4ML SOLN injection Inject into the vein.    [provider]  mometasone (ELOCON) 0.1 % ointment Apply topically 2 (two) times daily as needed. 05/12/20   [provider]  omeprazole (PRILOSEC) 40 MG capsule TAKE 1 CAPSULE BY MOUTH EVERY DAY 08/31/20   Burnard Hawthorne, FNP  PREVIDENT 5000 DRY MOUTH 1.1 % GEL dental gel PLEASE SEE ATTACHED FOR DETAILED DIRECTIONS 05/03/20   [provider]    Family History  Problem Relation Age of Onset  . Breast cancer Mother 76  . Lung cancer Mother   . Macular degeneration Mother   . Sudden death Father        committed suicide  . Diabetes Father   . Lung disease Sister        legionaires  . Hepatitis C Brother   . Colon cancer Neg Hx      Social History   Tobacco Use  . Smoking status: Never Smoker  . Smokeless tobacco: Never Used  Vaping Use  . Vaping Use: Never used  Substance Use Topics  . Alcohol use: Yes    Comment: socially  . Drug use: No  Allergies as of 12/20/2020 - Review Complete 06/03/2020  Allergen Reaction Noted  . Penicillins  07/03/2011    Review of Systems:    All systems reviewed and negative except where noted in HPI.   Physical Exam:  There were no vitals taken for this visit. No LMP recorded. Patient has had a hysterectomy. General:   Alert,  Well-developed, well-nourished, pleasant and cooperative in NAD Head:  Normocephalic and atraumatic. Eyes:  Sclera clear, no icterus.   Conjunctiva pink. Ears:  Normal auditory acuity. Neck:  Supple; no masses or thyromegaly. Lungs:  Respirations even and unlabored.  Clear throughout to auscultation.   No wheezes, crackles, or rhonchi. No acute distress. Heart:  Regular rate and rhythm; no murmurs, clicks, rubs, or gallops. Abdomen:  Normal bowel sounds.  No bruits.  Soft,  non-tender and non-distended without masses, hepatosplenomegaly or hernias noted.  No guarding or rebound tenderness.  Negative Carnett sign.   Rectal:  Deferred.  Pulses:  Normal pulses noted. Extremities:  No clubbing or edema.  No cyanosis. Neurologic:  Alert and oriented x3;  grossly normal neurologically. Skin:  Intact without significant lesions or rashes.  No jaundice. Lymph Nodes:  No significant cervical adenopathy. Psych:  Alert and cooperative. Normal mood and affect.  Imaging Studies: No results found.  Assessment and Plan:   Jennifer Patrick is a 70 y.o. y/o female who comes in today with a history of being on a PPI with acid breakthrough.  The patient will be switched to pantoprazole to be taken in the evening.  The patient will also be set up for a screening colonoscopy.  The patient has no worry symptoms requiring an upper endoscopy.  The patient will let me know if her heartburn is better on the pantoprazole in the evening.  The patient has been explained the plan agrees with it.    [name], MD. [name]    Note: This dictation was prepared with Dragon dictation along with smaller phrase technology. Any transcriptional errors that result from this process are unintentional.

## 2020-12-21 ENCOUNTER — Encounter: Payer: Self-pay | Admitting: Gastroenterology

## 2020-12-21 DIAGNOSIS — L4050 Arthropathic psoriasis, unspecified: Secondary | ICD-10-CM | POA: Diagnosis not present

## 2020-12-23 ENCOUNTER — Other Ambulatory Visit: Admission: RE | Admit: 2020-12-23 | Payer: Medicare Other | Source: Ambulatory Visit

## 2020-12-23 ENCOUNTER — Other Ambulatory Visit
Admission: RE | Admit: 2020-12-23 | Discharge: 2020-12-23 | Disposition: A | Discharge status: Home / Self Care | Payer: Medicare Other | Source: Ambulatory Visit | Attending: Gastroenterology | Admitting: Gastroenterology

## 2020-12-23 ENCOUNTER — Other Ambulatory Visit: Payer: Self-pay

## 2020-12-23 DIAGNOSIS — Z01812 Encounter for preprocedural laboratory examination: Secondary | ICD-10-CM | POA: Diagnosis not present

## 2020-12-23 DIAGNOSIS — Z20822 Contact with and (suspected) exposure to covid-19: Secondary | ICD-10-CM | POA: Insufficient documentation

## 2020-12-24 LAB — SARS CORONAVIRUS 2 (TAT 6-24 HRS): SARS Coronavirus 2: NEGATIVE

## 2020-12-24 NOTE — Discharge Instructions (Signed)

## 2020-12-27 ENCOUNTER — Ambulatory Visit: Payer: Medicare Other | Admitting: Anesthesiology

## 2020-12-27 ENCOUNTER — Other Ambulatory Visit: Payer: Self-pay

## 2020-12-27 ENCOUNTER — Encounter: Payer: Self-pay | Admitting: Gastroenterology

## 2020-12-27 ENCOUNTER — Ambulatory Visit
Admission: RE | Admit: 2020-12-27 | Discharge: 2020-12-27 | Disposition: A | Discharge status: Home / Self Care | Payer: Medicare Other | Attending: Gastroenterology | Admitting: Gastroenterology

## 2020-12-27 ENCOUNTER — Encounter
Admission: RE | Disposition: A | Discharge status: Home / Self Care | Payer: Self-pay | Source: Home / Self Care | Attending: Gastroenterology

## 2020-12-27 DIAGNOSIS — K5730 Diverticulosis of large intestine without perforation or abscess without bleeding: Secondary | ICD-10-CM | POA: Insufficient documentation

## 2020-12-27 DIAGNOSIS — Z1211 Encounter for screening for malignant neoplasm of colon: Secondary | ICD-10-CM | POA: Diagnosis not present

## 2020-12-27 DIAGNOSIS — Z79890 Hormone replacement therapy (postmenopausal): Secondary | ICD-10-CM | POA: Diagnosis not present

## 2020-12-27 DIAGNOSIS — Z79899 Other long term (current) drug therapy: Secondary | ICD-10-CM | POA: Insufficient documentation

## 2020-12-27 DIAGNOSIS — K641 Second degree hemorrhoids: Secondary | ICD-10-CM | POA: Insufficient documentation

## 2020-12-27 DIAGNOSIS — Z880 Allergy status to penicillin: Secondary | ICD-10-CM | POA: Diagnosis not present

## 2020-12-27 HISTORY — DX: Presence of dental prosthetic device (complete) (partial): Z97.2

## 2020-12-27 HISTORY — PX: COLONOSCOPY WITH PROPOFOL: SHX5780

## 2020-12-27 SURGERY — COLONOSCOPY WITH PROPOFOL
Anesthesia: General | Site: Rectum

## 2020-12-27 MED ORDER — ACETAMINOPHEN 325 MG PO TABS: 325.0000 mg | ORAL_TABLET | Freq: Once | ORAL | Status: DC

## 2020-12-27 MED ORDER — PROPOFOL 10 MG/ML IV BOLUS
INTRAVENOUS | Status: DC | PRN
  Administered 2020-12-27 (×3): 30 mg via INTRAVENOUS
  Administered 2020-12-27: 70 mg via INTRAVENOUS

## 2020-12-27 MED ORDER — LACTATED RINGERS IV SOLN: INTRAVENOUS | Status: DC

## 2020-12-27 MED ORDER — SODIUM CHLORIDE 0.9 % IV SOLN: INTRAVENOUS | Status: DC

## 2020-12-27 MED ORDER — LACTATED RINGERS IV SOLN: INTRAVENOUS | Status: DC | PRN

## 2020-12-27 MED ORDER — ACETAMINOPHEN 160 MG/5ML PO SOLN: 325.0000 mg | Freq: Once | ORAL | Status: DC

## 2020-12-27 MED ORDER — STERILE WATER FOR IRRIGATION IR SOLN: Status: DC | PRN

## 2020-12-27 MED ORDER — LIDOCAINE HCL (CARDIAC) PF 100 MG/5ML IV SOSY
PREFILLED_SYRINGE | INTRAVENOUS | Status: DC | PRN
  Administered 2020-12-27: 60 mg via INTRAVENOUS

## 2020-12-27 SURGICAL SUPPLY — 6 items
GOWN CVR UNV OPN BCK APRN NK (MISCELLANEOUS) ×2 IMPLANT
GOWN ISOL THUMB LOOP REG UNIV (MISCELLANEOUS) ×4
KIT PRC NS LF DISP ENDO (KITS) ×1 IMPLANT
KIT PROCEDURE OLYMPUS (KITS) ×2
MANIFOLD NEPTUNE II (INSTRUMENTS) ×2 IMPLANT
WATER STERILE IRR 250ML POUR (IV SOLUTION) ×2 IMPLANT

## 2020-12-27 NOTE — Anesthesia Procedure Notes (Signed)
Date/Time: 12/27/2020 11:09 AM Performed by: Dionne Bucy, CRNA Pre-anesthesia Checklist: Patient identified, Emergency Drugs available, Suction available, Patient being monitored and Timeout performed Oxygen Delivery Method: Nasal cannula Placement Confirmation: positive ETCO2

## 2020-12-27 NOTE — Anesthesia Postprocedure Evaluation (Signed)
Anesthesia Post Note  Patient: Jennifer Patrick  Procedure(s) Performed: COLONOSCOPY WITH PROPOFOL (N/A Rectum)     Patient location during evaluation: PACU Anesthesia Type: General Level of consciousness: awake and alert and oriented Pain management: satisfactory to patient Vital Signs Assessment: post-procedure vital signs reviewed and stable Respiratory status: spontaneous breathing, nonlabored ventilation and respiratory function stable Cardiovascular status: blood pressure returned to baseline and stable Postop Assessment: Adequate PO intake and No signs of nausea or vomiting Anesthetic complications: no   No complications documented.  Raliegh Ip

## 2020-12-27 NOTE — Anesthesia Procedure Notes (Signed)
Performed by: Dionne Bucy, CRNA

## 2020-12-27 NOTE — Anesthesia Preprocedure Evaluation (Signed)
Anesthesia Evaluation  Patient identified by MRN, date of birth, ID band Patient awake    Reviewed: Allergy & Precautions, H&P , NPO status , Patient's Chart, lab work & pertinent test results  Airway Mallampati: II  TM Distance: >3 FB Neck ROM: full    Dental no notable dental hx.    Pulmonary sleep apnea ,    Pulmonary exam normal breath sounds clear to auscultation       Cardiovascular hypertension, Normal cardiovascular exam Rhythm:regular Rate:Normal     Neuro/Psych PSYCHIATRIC DISORDERS    GI/Hepatic GERD  ,  Endo/Other  Hypothyroidism Morbid obesity  Renal/GU      Musculoskeletal   Abdominal   Peds  Hematology   Anesthesia Other Findings   Reproductive/Obstetrics                             Anesthesia Physical Anesthesia Plan  ASA: II  Anesthesia Plan: General   Post-op Pain Management:    Induction: Intravenous  PONV Risk Score and Plan: 3 and Treatment may vary due to age or medical condition, TIVA and Propofol infusion  Airway Management Planned: Natural Airway  Additional Equipment:   Intra-op Plan:   Post-operative Plan:   Informed Consent: I have reviewed the patients History and Physical, chart, labs and discussed the procedure including the risks, benefits and alternatives for the proposed anesthesia with the patient or authorized representative who has indicated his/her understanding and acceptance.     Dental Advisory Given  Plan Discussed with: CRNA  Anesthesia Plan Comments:         Anesthesia Quick Evaluation

## 2020-12-27 NOTE — H&P (Signed)
Jennifer Lame, MD [address] [facility], [place] [address] Phone: [phone] Fax : [phone]  Primary Care Physician:  Jennifer Hawthorne, FNP Primary Gastroenterologist:  Dr. Allen Patrick  Pre-Procedure History & Physical: HPI:  Jennifer Patrick is a 70 y.o. female is here for a screening colonoscopy.   Past Medical History:  Diagnosis Date  . Anxiety   . Arthritis   . Cataract   . GERD (gastroesophageal reflux disease)   . Hypertension   . Hypothyroidism   . Psoriasis    Dr. Nehemiah Patrick, on embrel  . Sleep apnea   . Wears partial dentures    upper    Past Surgical History:  Procedure Laterality Date  . ABDOMINAL HYSTERECTOMY  1999   for abnormal pap, TVH. No ovaries or cervix.   Marland Kitchen BREAST EXCISIONAL BIOPSY Left 1981   benign  . CATARACT EXTRACTION Bilateral   . THYROIDECTOMY  1976  . VITRECTOMY Right 2014    Prior to Admission medications   Medication Sig Start Date End Date Taking? Authorizing Provider  atenolol (TENORMIN) 25 MG tablet TAKE 1 TABLET BY MOUTH EVERY DAY 11/29/20  Yes Arnett, Jennifer Coder, FNP  Golimumab ([place] ARIA IV)  10/19/16  Yes [provider]  levothyroxine (SYNTHROID) 125 MCG tablet TAKE 1 TABLET BY MOUTH EVERY DAY 11/29/20  Yes Arnett, Jennifer Coder, FNP  mometasone (ELOCON) 0.1 % ointment Apply topically 2 (two) times daily as needed. 05/12/20  Yes [provider]  Multiple Vitamins-Minerals (PRESERVISION AREDS 2 PO) Take by mouth daily.   Yes [provider]  Na Sulfate-K Sulfate-Mg Sulf (SUPREP BOWEL PREP KIT) 17.5-3.13-1.6 GM/177ML SOLN Take 1 kit by mouth as directed. 12/20/20  Yes Jennifer Lame, MD  pantoprazole (PROTONIX) 40 MG tablet Take 1 tablet (40 mg total) by mouth at bedtime. 12/20/20  Yes , , MD  PREVIDENT 5000 DRY MOUTH 1.1 % GEL dental gel PLEASE SEE ATTACHED FOR DETAILED DIRECTIONS 05/03/20  Yes [provider]  sertraline (ZOLOFT) 50 MG tablet TAKE 1 TABLET BY MOUTH EVERY DAY  11/29/20  Yes Jennifer Hawthorne, FNP  triamterene-hydrochlorothiazide (HUDJSHF-02) 37.5-25 MG tablet TAKE 1 TABLET BY MOUTH EVERY DAY 11/29/20  Yes Jennifer Hawthorne, FNP    Allergies as of 12/20/2020 - Review Complete 12/20/2020  Allergen Reaction Noted  . Penicillins  07/03/2011    Family History  Problem Relation Age of Onset  . Breast cancer Mother 62  . Lung cancer Mother   . Macular degeneration Mother   . Sudden death Father        committed suicide  . Diabetes Father   . Lung disease Sister        legionaires  . Hepatitis C Brother   . Colon cancer Neg Hx     Social History   Socioeconomic History  . Marital status: Widowed    Spouse name: Not on file  . Number of children: 2  . Years of education: Not on file  . Highest education level: Not on file  Occupational History  . Occupation: retired  Tobacco Use  . Smoking status: Never Smoker  . Smokeless tobacco: Never Used  Vaping Use  . Vaping Use: Never used  Substance and Sexual Activity  . Alcohol use: Yes    Comment: socially  . Drug use: No  . Sexual activity: Not on file  Other Topics Concern  . Not on file  Social History Narrative   Works at [employer], 2014. Lives in  [place].   Enjoys going to [employer]. Grandchildren in [place].       Has son and daughter.   Lives alone, widow since 2012.       Social Determinants of Health   Financial Resource Strain: Low Risk   . Difficulty of Paying Living Expenses: Not hard at all  Food Insecurity: No Food Insecurity  . Worried About Charity fundraiser in the Last Year: Never true  . Ran Out of Food in the Last Year: Never true  Transportation Needs: No Transportation Needs  . Lack of Transportation (Medical): No  . Lack of Transportation (Non-Medical): No  Physical Activity: Not on file  Stress: No Stress Concern Present  . Feeling of Stress : Not at all  Social Connections: Unknown  . Frequency of Communication with Friends  and Family: Not on file  . Frequency of Social Gatherings with Friends and Family: More than three times a week  . Attends Religious Services: Not on file  . Active Member of Clubs or Organizations: Yes  . Attends Archivist Meetings: More than 4 times per year  . Marital Status: Widowed  Intimate Partner Violence: Not At Risk  . Fear of Current or Ex-Partner: No  . Emotionally Abused: No  . Physically Abused: No  . Sexually Abused: No    Review of Systems: See HPI, otherwise negative ROS  Physical Exam: BP (!) 154/75   Pulse 75   Temp (!) 97.5 F (36.4 C) (Tympanic)   Resp (!) 22   Ht 5' 3" (1.6 m)   Wt 84.4 kg   SpO2 96%   BMI 32.95 kg/m  General:   Alert,  pleasant and cooperative in NAD Head:  Normocephalic and atraumatic. Neck:  Supple; no masses or thyromegaly. Lungs:  Clear throughout to auscultation.    Heart:  Regular rate and rhythm. Abdomen:  Soft, nontender and nondistended. Normal bowel sounds, without guarding, and without rebound.   Neurologic:  Alert and  oriented x4;  grossly normal neurologically.  Impression/Plan: Jennifer Patrick is now here to undergo a screening colonoscopy.  Risks, benefits, and alternatives regarding colonoscopy have been reviewed with the patient.  Questions have been answered.  All parties agreeable.

## 2020-12-27 NOTE — Transfer of Care (Signed)
Immediate Anesthesia Transfer of Care Note  Patient: Jennifer Patrick  Procedure(s) Performed: COLONOSCOPY WITH PROPOFOL (N/A Rectum)  Patient Location: PACU  Anesthesia Type: General  Level of Consciousness: awake, alert  and patient cooperative  Airway and Oxygen Therapy: Patient Spontanous Breathing and Patient connected to supplemental oxygen  Post-op Assessment: Post-op Vital signs reviewed, Patient's Cardiovascular Status Stable, Respiratory Function Stable, Patent Airway and No signs of Nausea or vomiting  Post-op Vital Signs: Reviewed and stable  Complications: No complications documented.

## 2020-12-27 NOTE — Op Note (Signed)
[facility] Patient Name: Jennifer Patrick Procedure Date: 12/27/2020 11:00 AM MRN: [id number] Account #: [id number] Date of Birth: [birth year] Admit Type: Outpatient Age: 70 Room: [facility] OR ROOM 01 Gender: Female Note Status: Finalized Procedure:             Colonoscopy Indications:           Screening for colorectal malignant neoplasm Providers:             [name] MD, MD Referring MD:          Yvetta Coder. Arnett (Referring MD) Medicines:             Propofol per Anesthesia Complications:         No immediate complications. Procedure:             Pre-Anesthesia Assessment:                        - Prior to the procedure, a History and Physical was                         performed, and patient medications and allergies were                         reviewed. The patient's tolerance of previous                         anesthesia was also reviewed. The risks and benefits                         of the procedure and the sedation options and risks                         were discussed with the patient. All questions were                         answered, and informed consent was obtained. Prior                         Anticoagulants: The patient has taken no previous                         anticoagulant or antiplatelet agents. ASA Grade                         Assessment: II - A patient with mild systemic disease.                         After reviewing the risks and benefits, the patient                         was deemed in satisfactory condition to undergo the                         procedure.                        After obtaining informed consent, the colonoscope was  passed under direct vision. Throughout the procedure,                         the patient's blood pressure, pulse, and oxygen                         saturations were monitored continuously. The was                         introduced through the anus and  advanced to the the                         cecum, identified by appendiceal orifice and ileocecal                         valve. The colonoscopy was performed without                         difficulty. The patient tolerated the procedure well.                         The quality of the bowel preparation was excellent. Findings:      The perianal and digital rectal examinations were normal.      A few small-mouthed diverticula were found in the sigmoid colon.      Non-bleeding internal hemorrhoids were found during retroflexion. The       hemorrhoids were Grade II (internal hemorrhoids that prolapse but reduce       spontaneously). Impression:            - Diverticulosis in the sigmoid colon.                        - Non-bleeding internal hemorrhoids.                        - No specimens collected. Recommendation:        - Discharge patient to home.                        - Resume previous diet.                        - Continue present medications.                        - Repeat colonoscopy is not recommended due to current                         age (51 years or older) for screening purposes. Procedure Code(s):     --- Professional ---                        [phone], Colonoscopy, flexible; diagnostic, including                         collection of specimen(s) by brushing or washing, when                         performed (separate procedure) Diagnosis Code(s):     --- Professional ---  Z12.11, Encounter for screening for malignant neoplasm                         of colon CPT copyright 2019 American Medical Association. All rights reserved. The codes documented in this report are preliminary and upon coder review may  be revised to meet current compliance requirements. [name] MD, MD 12/27/2020 11:23:48 AM This report has been signed electronically. Number of Addenda: 0 Note Initiated On: 12/27/2020 11:00 AM Scope Withdrawal Time: 0 hours 7 minutes 52  seconds  Total Procedure Duration: 0 hours 9 minutes 43 seconds  Estimated Blood Loss:  Estimated blood loss: none.      [facility]

## 2020-12-28 ENCOUNTER — Encounter: Payer: Self-pay | Admitting: Gastroenterology

## 2021-01-19 ENCOUNTER — Ambulatory Visit: Payer: Medicare Other | Admitting: Family

## 2021-02-03 DIAGNOSIS — M25512 Pain in left shoulder: Secondary | ICD-10-CM | POA: Diagnosis not present

## 2021-02-03 DIAGNOSIS — E669 Obesity, unspecified: Secondary | ICD-10-CM | POA: Diagnosis not present

## 2021-02-03 DIAGNOSIS — R7989 Other specified abnormal findings of blood chemistry: Secondary | ICD-10-CM | POA: Diagnosis not present

## 2021-02-03 DIAGNOSIS — Z6833 Body mass index (BMI) 33.0-33.9, adult: Secondary | ICD-10-CM | POA: Diagnosis not present

## 2021-02-03 DIAGNOSIS — L4050 Arthropathic psoriasis, unspecified: Secondary | ICD-10-CM | POA: Diagnosis not present

## 2021-02-03 DIAGNOSIS — L401 Generalized pustular psoriasis: Secondary | ICD-10-CM | POA: Diagnosis not present

## 2021-02-03 IMAGING — DX DG CHEST 2V
2 series · 2 of 2 positions shown · non-contrast
Comparison: 09/02/2018

CLINICAL DATA: Right posterior hip pain

EXAM:
CHEST - 2 VIEW

[chest pa]
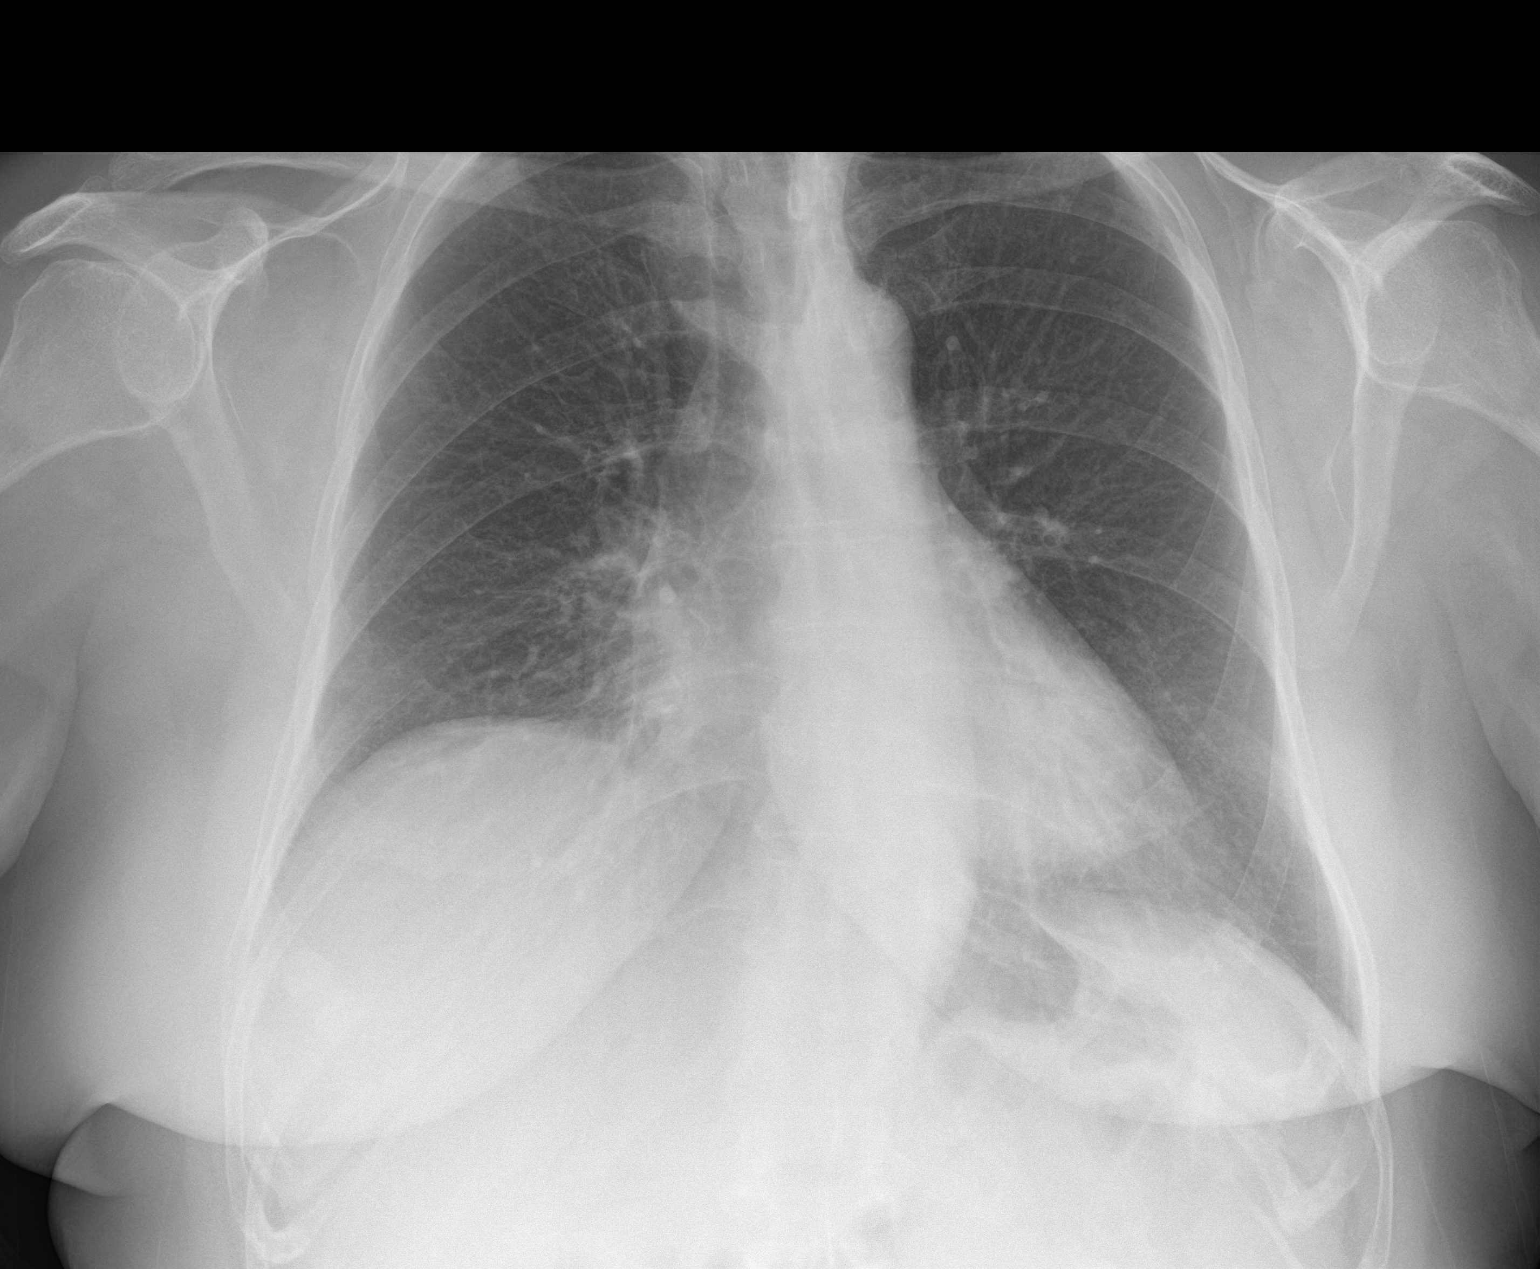

[chest lat]
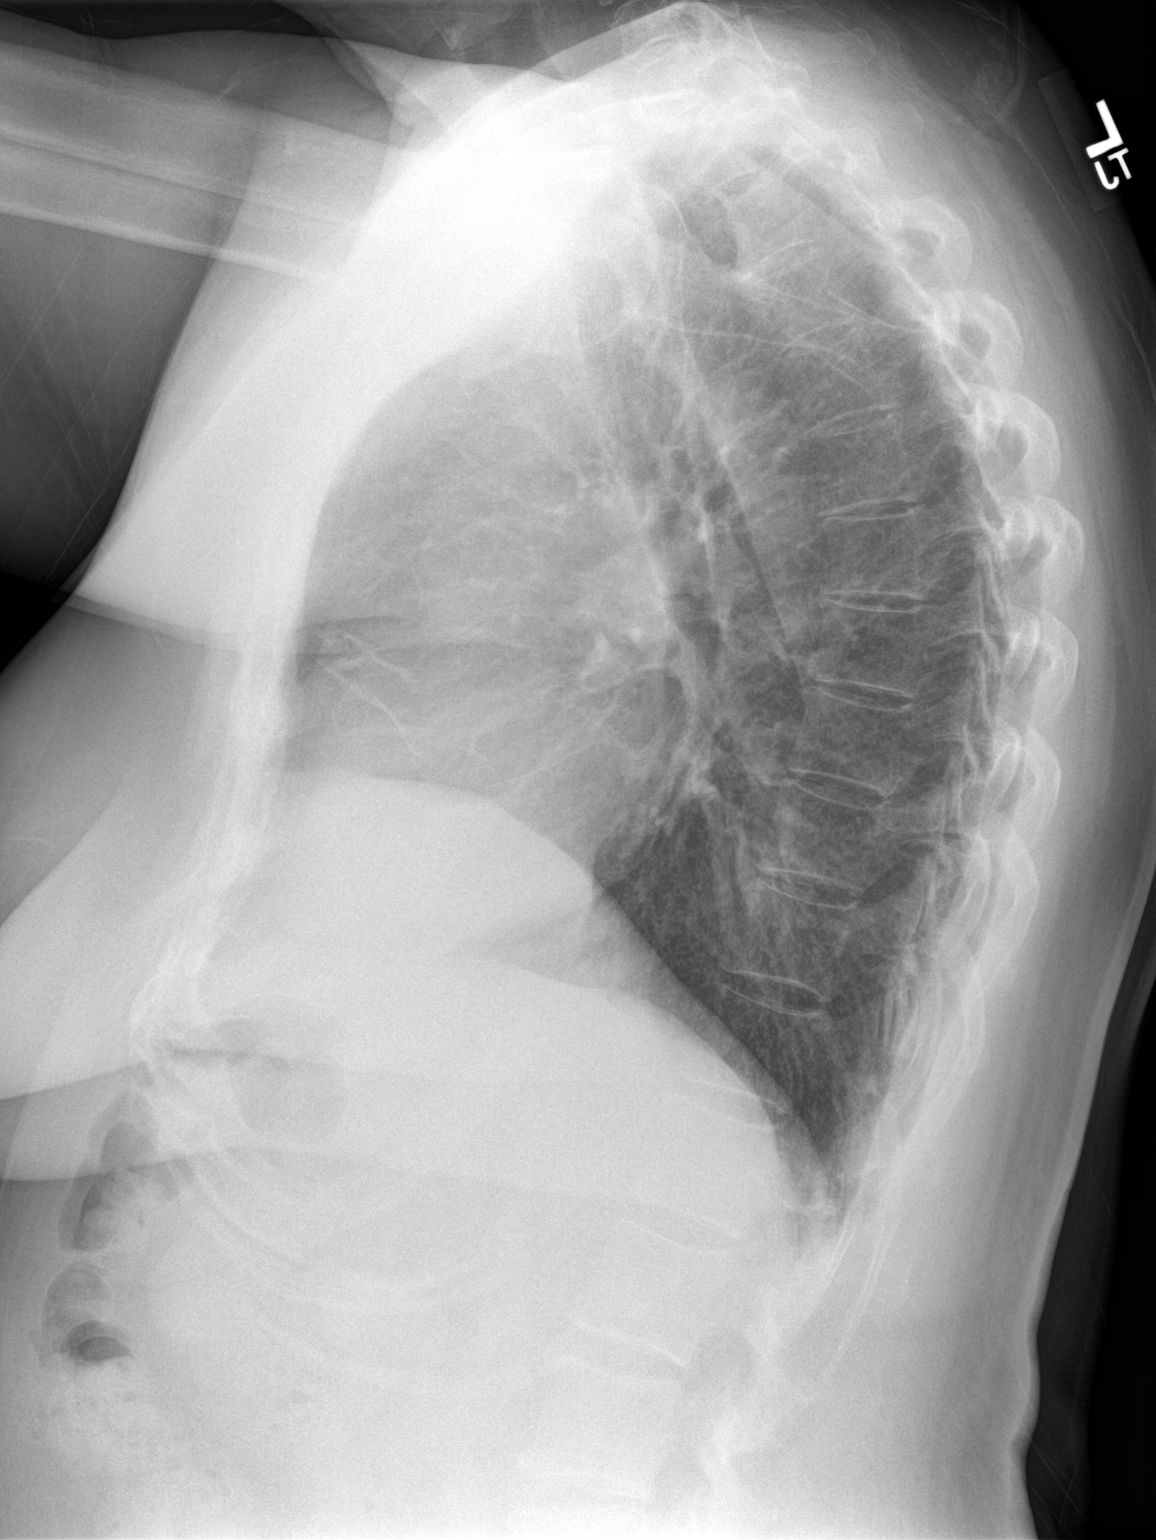

[2 of 2 positions shown; findings below may reference images not displayed]

FINDINGS: Stable chronic elevation of the right hemidiaphragm. Heart and
mediastinal contours are within normal limits. No focal opacities or
effusions. No acute bony abnormality.
IMPRESSION: No active cardiopulmonary disease.

## 2021-02-08 ENCOUNTER — Ambulatory Visit (INDEPENDENT_AMBULATORY_CARE_PROVIDER_SITE_OTHER): Payer: Medicare Other | Admitting: Family

## 2021-02-08 ENCOUNTER — Other Ambulatory Visit: Payer: Self-pay

## 2021-02-08 ENCOUNTER — Encounter: Payer: Self-pay | Admitting: Family

## 2021-02-08 VITALS — BP 120/72 | HR 71 | Temp 97.7°F | Ht 63.0 in | Wt 188.8 lb

## 2021-02-08 DIAGNOSIS — E039 Hypothyroidism, unspecified: Secondary | ICD-10-CM | POA: Diagnosis not present

## 2021-02-08 DIAGNOSIS — F3342 Major depressive disorder, recurrent, in full remission: Secondary | ICD-10-CM | POA: Diagnosis not present

## 2021-02-08 DIAGNOSIS — Z1231 Encounter for screening mammogram for malignant neoplasm of breast: Secondary | ICD-10-CM

## 2021-02-08 DIAGNOSIS — Z780 Asymptomatic menopausal state: Secondary | ICD-10-CM

## 2021-02-08 DIAGNOSIS — Z0000 Encounter for general adult medical examination without abnormal findings: Secondary | ICD-10-CM | POA: Diagnosis not present

## 2021-02-08 DIAGNOSIS — M069 Rheumatoid arthritis, unspecified: Secondary | ICD-10-CM | POA: Diagnosis not present

## 2021-02-08 DIAGNOSIS — I10 Essential (primary) hypertension: Secondary | ICD-10-CM | POA: Diagnosis not present

## 2021-02-08 DIAGNOSIS — E559 Vitamin D deficiency, unspecified: Secondary | ICD-10-CM

## 2021-02-08 NOTE — Progress Notes (Signed)
Subjective:    Patient ID: Jennifer Patrick, female    DOB: 1951/01/01, 70 y.o.   MRN: [id number]  CC: Jennifer SIEGRIST is a 70 y.o. female who presents today for physical exam and follow up.    HPI: Feels well today No complaints  HTN- compliant with atenolol $RemoveBefor'25mg'AQMANvpCvpBd$ , triamterene -hctz 37.5- $RemoveBef'25mg'ltBAgOwtcJ$ . No cp  Hypothyroidism- compliant with synthroid 129mcg  GERD- compliant with protonix $RemoveBefor'40mg'ExCHqBkQTzSW$  qd. No pain or trouble swallowing.   RA- follows with [facility] rheumatology, Dr Jennifer Patrick, for methotrexate, simponi aria IV   Depression- compliant with zoloft $RemoveBef'50mg'TTbzkpzsel$  and feels dose is adequate.     Colorectal Cancer Screening: UTD , dr Jennifer Patrick Breast Cancer Screening: Mammogram due Cervical Cancer Screening: h/o for abnormal pap, TVH. No ovaries or cervix.   Bone Health screening/DEXA for 65+: due Lung Cancer Screening: Doesn't have 20 year pack year history and age > 15 years yo 81 years       Tetanus - due        Pneumococcal - complete  Labs: Screening labs today. Exercise: Gets regular exercise.   Alcohol use:  rare Smoking/tobacco use: Nonsmoker.     HISTORY:  Past Medical History:  Diagnosis Date  . Anxiety   . Arthritis   . Cataract   . GERD (gastroesophageal reflux disease)   . Hypertension   . Hypothyroidism   . Psoriasis    Dr. Nehemiah Patrick, on embrel  . Sleep apnea   . Wears partial dentures    upper    Past Surgical History:  Procedure Laterality Date  . ABDOMINAL HYSTERECTOMY  1999   for abnormal pap, TVH. No ovaries or cervix.   [facility] BREAST EXCISIONAL BIOPSY Left 1981   benign  . CATARACT EXTRACTION Bilateral   . COLONOSCOPY WITH PROPOFOL N/A 12/27/2020   Procedure: COLONOSCOPY WITH PROPOFOL;  Surgeon: Jennifer Lame, MD;  Location: [place];  Service: Endoscopy;  Laterality: N/A;  . THYROIDECTOMY  1976  . VITRECTOMY Right 2014   Family History  Problem Relation Age of Onset  . Breast cancer Mother 74  . Lung cancer Mother   . Macular degeneration  Mother   . Sudden death Father        committed suicide  . Diabetes Father   . Lung disease Sister        legionaires  . Hepatitis C Brother   . Colon cancer Neg Hx       ALLERGIES: Penicillins  Current Outpatient Medications on File Prior to Visit  Medication Sig Dispense Refill  . atenolol (TENORMIN) 25 MG tablet TAKE 1 TABLET BY MOUTH EVERY DAY 90 tablet 0  . folic acid (FOLVITE) 1 MG tablet Take 1 mg by mouth daily.    . Golimumab ([place] ARIA IV)     . levothyroxine (SYNTHROID) 125 MCG tablet TAKE 1 TABLET BY MOUTH EVERY DAY 90 tablet 0  . methotrexate (RHEUMATREX) 2.5 MG tablet Take 1 tablet by mouth.    . mometasone (ELOCON) 0.1 % ointment Apply topically 2 (two) times daily as needed.    . Multiple Vitamins-Minerals (PRESERVISION AREDS 2 PO) Take by mouth daily.    . pantoprazole (PROTONIX) 40 MG tablet Take 1 tablet (40 mg total) by mouth at bedtime. 30 tablet 6  . PREVIDENT 5000 DRY MOUTH 1.1 % GEL dental gel PLEASE SEE ATTACHED FOR DETAILED DIRECTIONS    . sertraline (ZOLOFT) 50 MG tablet TAKE 1 TABLET BY MOUTH EVERY DAY 90 tablet 0  . triamterene-hydrochlorothiazide (  MAXZIDE-25) 37.5-25 MG tablet TAKE 1 TABLET BY MOUTH EVERY DAY 90 tablet 0  . Na Sulfate-K Sulfate-Mg Sulf (SUPREP BOWEL PREP KIT) 17.5-3.13-1.6 GM/177ML SOLN Take 1 kit by mouth as directed. 354 mL 0   No current facility-administered medications on file prior to visit.    Social History   Tobacco Use  . Smoking status: Never Smoker  . Smokeless tobacco: Never Used  Vaping Use  . Vaping Use: Never used  Substance Use Topics  . Alcohol use: Yes    Comment: socially  . Drug use: No    Review of Systems  Constitutional: Negative for chills, fever and unexpected weight change.  HENT: Negative for congestion.   Respiratory: Negative for cough.   Cardiovascular: Negative for chest pain, palpitations and leg swelling.  Gastrointestinal: Negative for nausea and vomiting.  Musculoskeletal: Negative  for arthralgias and myalgias.  Skin: Negative for rash.  Neurological: Negative for headaches.  Hematological: Negative for adenopathy.  Psychiatric/Behavioral: Negative for confusion.      Objective:    BP 120/72 (BP Location: Left Arm, Patient Position: Sitting, Cuff Size: Large)   Pulse 71   Temp 97.7 F (36.5 C) (Oral)   Ht $R'5\' 3"'Cb$  (1.6 m)   Wt 188 lb 12.8 oz (85.6 kg)   SpO2 98%   BMI 33.44 kg/m   BP Readings from Last 3 Encounters:  02/08/21 120/72  12/27/20 122/66  12/20/20 138/79   Wt Readings from Last 3 Encounters:  02/08/21 188 lb 12.8 oz (85.6 kg)  12/27/20 186 lb (84.4 kg)  12/20/20 190 lb 6.4 oz (86.4 kg)    Physical Exam Vitals reviewed.  Constitutional:      Appearance: Normal appearance. She is well-developed.  Eyes:     Conjunctiva/sclera: Conjunctivae normal.  Neck:     Thyroid: No thyroid mass or thyromegaly.  Cardiovascular:     Rate and Rhythm: Normal rate and regular rhythm.     Pulses: Normal pulses.     Heart sounds: Normal heart sounds.  Pulmonary:     Effort: Pulmonary effort is normal.     Breath sounds: Normal breath sounds. No wheezing, rhonchi or rales.  Chest:  Breasts: Breasts are symmetrical.     Right: No inverted nipple, mass, nipple discharge, skin change or tenderness.     Left: No inverted nipple, mass, nipple discharge, skin change or tenderness.    Abdominal:     General: Bowel sounds are normal. There is no distension.     Palpations: Abdomen is soft. Abdomen is not rigid. There is no fluid wave or mass.     Tenderness: There is no abdominal tenderness. There is no guarding or rebound.  Lymphadenopathy:     Head:     Right side of head: No submental, submandibular, tonsillar, preauricular, posterior auricular or occipital adenopathy.     Left side of head: No submental, submandibular, tonsillar, preauricular, posterior auricular or occipital adenopathy.     Cervical: No cervical adenopathy.     Right cervical: No  superficial, deep or posterior cervical adenopathy.    Left cervical: No superficial, deep or posterior cervical adenopathy.  Skin:    General: Skin is warm and dry.  Neurological:     Mental Status: She is alert.  Psychiatric:        Speech: Speech normal.        Behavior: Behavior normal.        Thought Content: Thought content normal.  Assessment & Plan:   Problem List Items Addressed This Visit      Cardiovascular and Mediastinum   Hypertension    Chronic, stable. Continue atenolol $RemoveBeforeDEI'25mg'sawAWFIrzVrEdCmb$ , triamterene -hctz 37.5- $RemoveBef'25mg'tPxNTqUPcx$ .        Endocrine   Hypothyroidism    Anticipate euthyroid, pending tsh. Continue synthroid 131mcg        Musculoskeletal and Integument   Rheumatoid arthritis (HCC)    Chronic, stable. follows with [facility] rheumatology, Dr Jennifer Patrick, for methotrexate, simponi aria IV . Will follow      Relevant Medications   methotrexate (RHEUMATREX) 2.5 MG tablet     Other   Depression    Chronic, stable. Continue zoloft $RemoveBeforeD'50mg'jcgkPLcHDFpKhF$       Routine physical examination - Primary    Deferred pelvic exam in the absence of complaints and no cervix after TVH. Mammogram and DEXA ordered and patient will arrange. Advised tdap and shingrex at local pharmacy      Relevant Orders   Comprehensive metabolic panel   Lipid panel   TSH    Other Visit Diagnoses    Encounter for screening mammogram for malignant neoplasm of breast       Relevant Orders   MM 3D SCREEN BREAST BILATERAL   Asymptomatic menopausal state       Relevant Orders   DG Bone Density   Vitamin D deficiency       Relevant Orders   VITAMIN D 25 Hydroxy (Vit-D Deficiency, Fractures)       I am having [name]. Cantero "[name]" maintain her mometasone, PreviDent 5000 Dry Mouth, atenolol, levothyroxine, triamterene-hydrochlorothiazide, sertraline, Golimumab (SIMPONI ARIA IV), Suprep Bowel Prep Kit, pantoprazole, Multiple Vitamins-Minerals (PRESERVISION AREDS 2 PO), folic acid, and  methotrexate.   No orders of the defined types were placed in this encounter.   Return precautions given.   Risks, benefits, and alternatives of the medications and treatment plan prescribed today were discussed, and patient expressed understanding.   Education regarding symptom management and diagnosis given to patient on AVS.   Continue to follow with Burnard Hawthorne, FNP for routine health maintenance.   Jennifer Patrick and I agreed with plan.   Mable Paris, FNP

## 2021-02-08 NOTE — Assessment & Plan Note (Signed)
Chronic, stable Continue zoloft 50 mg  

## 2021-02-08 NOTE — Assessment & Plan Note (Signed)
Deferred pelvic exam in the absence of complaints and no cervix after TVH. Mammogram and DEXA ordered and patient will arrange. Advised tdap and shingrex at local pharmacy

## 2021-02-08 NOTE — Assessment & Plan Note (Signed)
Chronic, stable. Continue atenolol 25mg , triamterene -hctz 37.5- 25mg .

## 2021-02-08 NOTE — Assessment & Plan Note (Signed)
Chronic, stable. follows with [facility] rheumatology, Dr Amil Amen, for methotrexate, simponi aria IV . Will follow

## 2021-02-08 NOTE — Assessment & Plan Note (Signed)
Anticipate euthyroid, pending tsh. Continue synthroid 148mcg

## 2021-02-08 NOTE — Patient Instructions (Addendum)
Tdap ( tetanus vaccine) at local pharmacy Please call  and schedule your 3D mammogram, bone density scan as discussed.   [place]  [place], [place]   Nice to see you!   Health Maintenance for Postmenopausal Women Menopause is a normal process in which your ability to get pregnant comes to an end. This process happens slowly over many months or years, usually between the ages of 24 and 34. Menopause is complete when you have missed your menstrual periods for 12 months. It is important to talk with your health care provider about some of the most common conditions that affect women after menopause (postmenopausal women). These include heart disease, cancer, and bone loss (osteoporosis). Adopting a healthy lifestyle and getting preventive care can help to promote your health and wellness. The actions you take can also lower your chances of developing some of these common conditions. What should I know about menopause? During menopause, you may get a number of symptoms, such as:  Hot flashes. These can be moderate or severe.  Night sweats.  Decrease in sex drive.  Mood swings.  Headaches.  Tiredness.  Irritability.  Memory problems.  Insomnia. Choosing to treat or not to treat these symptoms is a decision that you make with your health care provider. Do I need hormone replacement therapy?  Hormone replacement therapy is effective in treating symptoms that are caused by menopause, such as hot flashes and night sweats.  Hormone replacement carries certain risks, especially as you become older. If you are thinking about using estrogen or estrogen with progestin, discuss the benefits and risks with your health care provider. What is my risk for heart disease and stroke? The risk of heart disease, heart attack, and stroke increases as you age. One of the causes may be a change in the body's hormones during menopause. This can  affect how your body uses dietary fats, triglycerides, and cholesterol. Heart attack and stroke are medical emergencies. There are many things that you can do to help prevent heart disease and stroke. Watch your blood pressure  High blood pressure causes heart disease and increases the risk of stroke. This is more likely to develop in people who have high blood pressure readings, are of African descent, or are overweight.  Have your blood pressure checked: ? Every 3-5 years if you are 65-35 years of age. ? Every year if you are 64 years old or older. Eat a healthy diet  Eat a diet that includes plenty of vegetables, fruits, low-fat dairy products, and lean protein.  Do not eat a lot of foods that are high in solid fats, added sugars, or sodium.   Get regular exercise Get regular exercise. This is one of the most important things you can do for your health. Most adults should:  Try to exercise for at least 150 minutes each week. The exercise should increase your heart rate and make you sweat (moderate-intensity exercise).  Try to do strengthening exercises at least twice each week. Do these in addition to the moderate-intensity exercise.  Spend less time sitting. Even light physical activity can be beneficial. Other tips  Work with your health care provider to achieve or maintain a healthy weight.  Do not use any products that contain nicotine or tobacco, such as cigarettes, e-cigarettes, and chewing tobacco. If you need help quitting, ask your health care provider.  Know your numbers. Ask your health care provider to check your cholesterol and  your blood sugar (glucose). Continue to have your blood tested as directed by your health care provider. Do I need screening for cancer? Depending on your health history and family history, you may need to have cancer screening at different stages of your life. This may include screening for:  Breast cancer.  Cervical cancer.  Lung  cancer.  Colorectal cancer. What is my risk for osteoporosis? After menopause, you may be at increased risk for osteoporosis. Osteoporosis is a condition in which bone destruction happens more quickly than new bone creation. To help prevent osteoporosis or the bone fractures that can happen because of osteoporosis, you may take the following actions:  If you are 78-23 years old, get at least 1,000 mg of calcium and at least 600 mg of vitamin D per day.  If you are older than age 40 but younger than age 27, get at least 1,200 mg of calcium and at least 600 mg of vitamin D per day.  If you are older than age 69, get at least 1,200 mg of calcium and at least 800 mg of vitamin D per day. Smoking and drinking excessive alcohol increase the risk of osteoporosis. Eat foods that are rich in calcium and vitamin D, and do weight-bearing exercises several times each week as directed by your health care provider. How does menopause affect my mental health? Depression may occur at any age, but it is more common as you become older. Common symptoms of depression include:  Low or sad mood.  Changes in sleep patterns.  Changes in appetite or eating patterns.  Feeling an overall lack of motivation or enjoyment of activities that you previously enjoyed.  Frequent crying spells. Talk with your health care provider if you think that you are experiencing depression. General instructions See your health care provider for regular wellness exams and vaccines. This may include:  Scheduling regular health, dental, and eye exams.  Getting and maintaining your vaccines. These include: ? Influenza vaccine. Get this vaccine each year before the flu season begins. ? Pneumonia vaccine. ? Shingles vaccine. ? Tetanus, diphtheria, and pertussis (Tdap) booster vaccine. Your health care provider may also recommend other immunizations. Tell your health care provider if you have ever been abused or do not feel safe at  home. Summary  Menopause is a normal process in which your ability to get pregnant comes to an end.  This condition causes hot flashes, night sweats, decreased interest in sex, mood swings, headaches, or lack of sleep.  Treatment for this condition may include hormone replacement therapy.  Take actions to keep yourself healthy, including exercising regularly, eating a healthy diet, watching your weight, and checking your blood pressure and blood sugar levels.  Get screened for cancer and depression. Make sure that you are up to date with all your vaccines. This information is not intended to replace advice given to you by your health care provider. Make sure you discuss any questions you have with your health care provider. Document Revised: 08/28/2018 Document Reviewed: 08/28/2018 Elsevier Patient Education  2021 [employer].

## 2021-02-09 NOTE — Addendum Note (Signed)
Addended by: Burnard Hawthorne on: 02/09/2021 09:28 AM   Modules accepted: Level of Service

## 2021-02-17 DIAGNOSIS — L4050 Arthropathic psoriasis, unspecified: Secondary | ICD-10-CM | POA: Diagnosis not present

## 2021-02-17 DIAGNOSIS — Z79899 Other long term (current) drug therapy: Secondary | ICD-10-CM | POA: Diagnosis not present

## 2021-02-25 DIAGNOSIS — Z23 Encounter for immunization: Secondary | ICD-10-CM | POA: Diagnosis not present

## 2021-03-01 ENCOUNTER — Other Ambulatory Visit: Payer: Self-pay | Admitting: Family

## 2021-03-01 DIAGNOSIS — I10 Essential (primary) hypertension: Secondary | ICD-10-CM

## 2021-03-22 ENCOUNTER — Other Ambulatory Visit: Payer: Self-pay | Admitting: Family

## 2021-04-14 DIAGNOSIS — L4050 Arthropathic psoriasis, unspecified: Secondary | ICD-10-CM | POA: Diagnosis not present

## 2021-04-14 DIAGNOSIS — Z79899 Other long term (current) drug therapy: Secondary | ICD-10-CM | POA: Diagnosis not present

## 2021-05-02 ENCOUNTER — Encounter: Payer: Self-pay | Admitting: Family

## 2021-05-09 DIAGNOSIS — L4050 Arthropathic psoriasis, unspecified: Secondary | ICD-10-CM | POA: Diagnosis not present

## 2021-05-09 DIAGNOSIS — Z6834 Body mass index (BMI) 34.0-34.9, adult: Secondary | ICD-10-CM | POA: Diagnosis not present

## 2021-05-09 DIAGNOSIS — L401 Generalized pustular psoriasis: Secondary | ICD-10-CM | POA: Diagnosis not present

## 2021-05-09 DIAGNOSIS — M25512 Pain in left shoulder: Secondary | ICD-10-CM | POA: Diagnosis not present

## 2021-05-09 DIAGNOSIS — E669 Obesity, unspecified: Secondary | ICD-10-CM | POA: Diagnosis not present

## 2021-05-09 DIAGNOSIS — R7989 Other specified abnormal findings of blood chemistry: Secondary | ICD-10-CM | POA: Diagnosis not present

## 2021-05-09 DIAGNOSIS — M79675 Pain in left toe(s): Secondary | ICD-10-CM | POA: Diagnosis not present

## 2021-05-31 ENCOUNTER — Other Ambulatory Visit: Payer: Self-pay | Admitting: Family

## 2021-05-31 DIAGNOSIS — I10 Essential (primary) hypertension: Secondary | ICD-10-CM

## 2021-06-06 ENCOUNTER — Ambulatory Visit: Payer: Medicare Other

## 2021-06-07 DIAGNOSIS — H542X21 Low vision right eye category 2, low vision left eye category 1: Secondary | ICD-10-CM | POA: Diagnosis not present

## 2021-06-07 DIAGNOSIS — H35033 Hypertensive retinopathy, bilateral: Secondary | ICD-10-CM | POA: Diagnosis not present

## 2021-06-07 DIAGNOSIS — H353134 Nonexudative age-related macular degeneration, bilateral, advanced atrophic with subfoveal involvement: Secondary | ICD-10-CM | POA: Diagnosis not present

## 2021-06-09 DIAGNOSIS — L4050 Arthropathic psoriasis, unspecified: Secondary | ICD-10-CM | POA: Diagnosis not present

## 2021-06-09 DIAGNOSIS — Z79899 Other long term (current) drug therapy: Secondary | ICD-10-CM | POA: Diagnosis not present

## 2021-06-09 DIAGNOSIS — M79675 Pain in left toe(s): Secondary | ICD-10-CM | POA: Diagnosis not present

## 2021-06-23 ENCOUNTER — Other Ambulatory Visit: Payer: Self-pay | Admitting: Family

## 2021-06-23 ENCOUNTER — Other Ambulatory Visit: Payer: Self-pay | Admitting: Gastroenterology

## 2021-07-18 DIAGNOSIS — E663 Overweight: Secondary | ICD-10-CM | POA: Diagnosis not present

## 2021-07-18 DIAGNOSIS — L401 Generalized pustular psoriasis: Secondary | ICD-10-CM | POA: Diagnosis not present

## 2021-07-18 DIAGNOSIS — R7989 Other specified abnormal findings of blood chemistry: Secondary | ICD-10-CM | POA: Diagnosis not present

## 2021-07-18 DIAGNOSIS — L4050 Arthropathic psoriasis, unspecified: Secondary | ICD-10-CM | POA: Diagnosis not present

## 2021-07-18 DIAGNOSIS — Z6833 Body mass index (BMI) 33.0-33.9, adult: Secondary | ICD-10-CM | POA: Diagnosis not present

## 2021-07-18 DIAGNOSIS — M79675 Pain in left toe(s): Secondary | ICD-10-CM | POA: Diagnosis not present

## 2021-07-18 DIAGNOSIS — M25512 Pain in left shoulder: Secondary | ICD-10-CM | POA: Diagnosis not present

## 2021-07-20 DIAGNOSIS — Z23 Encounter for immunization: Secondary | ICD-10-CM | POA: Diagnosis not present

## 2021-08-07 DIAGNOSIS — R197 Diarrhea, unspecified: Secondary | ICD-10-CM | POA: Diagnosis not present

## 2021-08-07 DIAGNOSIS — K5900 Constipation, unspecified: Secondary | ICD-10-CM | POA: Diagnosis not present

## 2021-08-07 DIAGNOSIS — R1084 Generalized abdominal pain: Secondary | ICD-10-CM | POA: Diagnosis not present

## 2021-08-07 DIAGNOSIS — R109 Unspecified abdominal pain: Secondary | ICD-10-CM | POA: Diagnosis not present

## 2021-08-15 ENCOUNTER — Ambulatory Visit (INDEPENDENT_AMBULATORY_CARE_PROVIDER_SITE_OTHER): Payer: Medicare Other | Admitting: Family

## 2021-08-15 ENCOUNTER — Other Ambulatory Visit: Payer: Self-pay

## 2021-08-15 ENCOUNTER — Encounter: Payer: Self-pay | Admitting: Family

## 2021-08-15 VITALS — BP 114/80 | HR 87 | Temp 97.8°F | Ht 63.0 in | Wt 187.6 lb

## 2021-08-15 DIAGNOSIS — Z13220 Encounter for screening for lipoid disorders: Secondary | ICD-10-CM | POA: Diagnosis not present

## 2021-08-15 DIAGNOSIS — I10 Essential (primary) hypertension: Secondary | ICD-10-CM | POA: Diagnosis not present

## 2021-08-15 DIAGNOSIS — F3342 Major depressive disorder, recurrent, in full remission: Secondary | ICD-10-CM | POA: Diagnosis not present

## 2021-08-15 DIAGNOSIS — Z136 Encounter for screening for cardiovascular disorders: Secondary | ICD-10-CM

## 2021-08-15 DIAGNOSIS — E039 Hypothyroidism, unspecified: Secondary | ICD-10-CM

## 2021-08-15 DIAGNOSIS — Z780 Asymptomatic menopausal state: Secondary | ICD-10-CM | POA: Diagnosis not present

## 2021-08-15 LAB — CBC WITH DIFFERENTIAL/PLATELET
Basophils Absolute: 0.1 10*3/uL (ref 0.0–0.1)
Basophils Relative: 0.7 % (ref 0.0–3.0)
Eosinophils Absolute: 0.3 10*3/uL (ref 0.0–0.7)
Eosinophils Relative: 4.5 % (ref 0.0–5.0)
HCT: 41.8 % (ref 36.0–46.0)
Hemoglobin: 13.9 g/dL (ref 12.0–15.0)
Lymphocytes Relative: 32.1 % (ref 12.0–46.0)
Lymphs Abs: 2.3 10*3/uL (ref 0.7–4.0)
MCHC: 33.2 g/dL (ref 30.0–36.0)
MCV: 87.5 fl (ref 78.0–100.0)
Monocytes Absolute: 0.8 10*3/uL (ref 0.1–1.0)
Monocytes Relative: 11.5 % (ref 3.0–12.0)
Neutro Abs: 3.7 10*3/uL (ref 1.4–7.7)
Neutrophils Relative %: 51.2 % (ref 43.0–77.0)
Platelets: 216 10*3/uL (ref 150.0–400.0)
RBC: 4.78 Mil/uL (ref 3.87–5.11)
RDW: 14.7 % (ref 11.5–15.5)
WBC: 7.3 10*3/uL (ref 4.0–10.5)

## 2021-08-15 LAB — LIPID PANEL
Cholesterol: 192 mg/dL (ref 0–200)
HDL: 40.9 mg/dL (ref >39.00)
LDL Cholesterol: 120 mg/dL — ABNORMAL HIGH (ref 0–99)
NonHDL: 150.63
Total CHOL/HDL Ratio: 5
Triglycerides: 151 mg/dL — ABNORMAL HIGH (ref 0.0–149.0)
VLDL: 30.2 mg/dL (ref 0.0–40.0)

## 2021-08-15 LAB — TSH: TSH: 5.59 u[IU]/mL — ABNORMAL HIGH (ref 0.35–5.50)

## 2021-08-15 NOTE — Progress Notes (Deleted)
Subjective:    Patient ID: Jennifer Patrick, female    DOB: 27-Mar-1951, 70 y.o.   MRN: [id number]  CC: ZIYANA MORIKAWA is a 70 y.o. female who presents today for follow up.   HPI: HPI  Diarrhea and abdominal cramping has resolved since starting colace and being seen at urgent care. She never started antibiotics prescribed from urgent care.    HTN-compliant with  atenolol $RemoveB'25mg'MWMheMAl$ , triamterene -hctz 37.5- $RemoveBef'25mg'ONMSwfdNSg$ .  Mammogram, dexa due.  Rheumatoid arthritis-continues to follow with rheumatology, Dr. Amil Amen  Normal potassium, sodium,  creatinine 0.8 labs performed 8 days ago HISTORY:  Past Medical History:  Diagnosis Date   Anxiety    Arthritis    Cataract    GERD (gastroesophageal reflux disease)    Hypertension    Hypothyroidism    Psoriasis    Dr. Nehemiah Massed, on embrel   Sleep apnea    Wears partial dentures    upper   Past Surgical History:  Procedure Laterality Date   ABDOMINAL HYSTERECTOMY  1999   for abnormal pap, TVH. No ovaries or cervix.    BREAST EXCISIONAL BIOPSY Left 1981   benign   CATARACT EXTRACTION Bilateral    COLONOSCOPY WITH PROPOFOL N/A 12/27/2020   Procedure: COLONOSCOPY WITH PROPOFOL;  Surgeon: [name], MD;  Location: [facility];  Service: Endoscopy;  Laterality: N/A;   THYROIDECTOMY  1976   VITRECTOMY Right 2014   Family History  Problem Relation Age of Onset   Breast cancer Mother 7   Lung cancer Mother    Macular degeneration Mother    Sudden death Father        committed suicide   Diabetes Father    Lung disease Sister        legionaires   Hepatitis C Brother    Colon cancer Neg Hx     Allergies: Penicillins Current Outpatient Medications on File Prior to Visit  Medication Sig Dispense Refill   folic acid (FOLVITE) 1 MG tablet Take 1 mg by mouth daily.     Golimumab (SIMPONI ARIA IV)      levothyroxine (SYNTHROID) 125 MCG tablet TAKE 1 TABLET BY MOUTH EVERY DAY 90 tablet 0   methotrexate (RHEUMATREX) 2.5 MG tablet  Take 1 tablet by mouth.     Multiple Vitamins-Minerals (PRESERVISION AREDS 2 PO) Take by mouth daily.     pantoprazole (PROTONIX) 40 MG tablet TAKE 1 TABLET BY MOUTH EVERYDAY AT BEDTIME 90 tablet 2   sertraline (ZOLOFT) 50 MG tablet TAKE 1 TABLET BY MOUTH EVERY DAY 90 tablet 0   triamterene-hydrochlorothiazide (MAXZIDE-25) 37.5-25 MG tablet TAKE 1 TABLET BY MOUTH EVERY DAY 90 tablet 0   No current facility-administered medications on file prior to visit.    Social History   Tobacco Use   Smoking status: Never   Smokeless tobacco: Never  Vaping Use   Vaping Use: Never used  Substance Use Topics   Alcohol use: Yes    Comment: socially   Drug use: No    Review of Systems    Objective:    BP 114/80 (BP Location: Left Arm, Patient Position: Sitting, Cuff Size: Large)   Pulse 87   Temp 97.8 F (36.6 C) (Oral)   Ht $R'5\' 3"'nP$  (1.6 m)   Wt 187 lb 9.6 oz (85.1 kg)   SpO2 96%   BMI 33.23 kg/m  BP Readings from Last 3 Encounters:  08/15/21 114/80  02/08/21 120/72  12/27/20 122/66   Wt Readings from Last 3  Encounters:  08/15/21 187 lb 9.6 oz (85.1 kg)  02/08/21 188 lb 12.8 oz (85.6 kg)  12/27/20 186 lb (84.4 kg)    Physical Exam     Assessment & Plan:   Problem List Items Addressed This Visit       Cardiovascular and Mediastinum   Hypertension - Primary   Relevant Orders   CBC with Differential/Platelet     Endocrine   Hypothyroidism   Relevant Orders   TSH   Other Visit Diagnoses     Encounter for lipid screening for cardiovascular disease       Relevant Orders   Lipid panel   Asymptomatic menopausal state             I have discontinued [name]. Lalli "[name]"'s mometasone, PreviDent 5000 Dry Mouth, Suprep Bowel Prep Kit, and atenolol. I am also having her maintain her Golimumab (SIMPONI ARIA IV), Multiple Vitamins-Minerals (PRESERVISION AREDS 2 PO), folic acid, methotrexate, sertraline, levothyroxine, triamterene-hydrochlorothiazide, and  pantoprazole.   No orders of the defined types were placed in this encounter.   Return precautions given.   Risks, benefits, and alternatives of the medications and treatment plan prescribed today were discussed, and patient expressed understanding.   Education regarding symptom management and diagnosis given to patient on AVS.  Continue to follow with Burnard Hawthorne, FNP for routine health maintenance.   Jennifer Patrick and I agreed with plan.   Mable Paris, FNP

## 2021-08-15 NOTE — Assessment & Plan Note (Signed)
Chronic, stable Continue zoloft 50 mg  

## 2021-08-15 NOTE — Assessment & Plan Note (Signed)
Well controlled. Agreed we could do a trial stop of atenolol 25mg  with close monitoring of blood pressure. Goal < 120/80.  She will continue  triamterene -hctz 37.5- 25mg .

## 2021-08-15 NOTE — Assessment & Plan Note (Signed)
Chronic, stable.  Updating TSH today.  Continue Synthroid 18mcg

## 2021-08-15 NOTE — Progress Notes (Signed)
Subjective:    Patient ID: Jennifer Patrick, female    DOB: [birth year], 70 y.o.   MRN: [id number]  CC: Jennifer Patrick is a 70 y.o. female who presents today for follow up.   HPI: Diarrhea and abdominal cramping has resolved since starting colace and being seen at urgent care. She never started antibiotics prescribed from urgent care as didn't feel needed. Her constipation has resolved with daily use of colace.    HTN-compliant with  atenolol $RemoveB'25mg'mWaqOhBs$ , triamterene -hctz 37.5- $RemoveBef'25mg'nLxTBMAhHx$ . She wanders if she needs both medications as she occasionally skips her medications. She hasnt taken medications today. She has BP machine at home.  No history of myocardial infarction, heart failure, palpitations, arrhythmia  Mammogram, dexa due.  Rheumatoid arthritis-continues to follow with rheumatology, Jennifer Patrick  Normal potassium, sodium,  creatinine 0.8 labs performed 8 days ago  Depression-compliant with Zoloft 50 mg.  She feels that this medication takes the edge off and is working well for her.  Hypothyroidism-compliant with Synthroid 171mcg  HISTORY:  Past Medical History:  Diagnosis Date   Anxiety    Arthritis    Cataract    GERD (gastroesophageal reflux disease)    Hypertension    Hypothyroidism    Psoriasis    Jennifer Patrick, on embrel   Sleep apnea    Wears partial dentures    upper   Past Surgical History:  Procedure Laterality Date   ABDOMINAL HYSTERECTOMY  1999   for abnormal pap, TVH. No ovaries or cervix.    BREAST EXCISIONAL BIOPSY Left 1981   benign   CATARACT EXTRACTION Bilateral    COLONOSCOPY WITH PROPOFOL N/A 12/27/2020   Procedure: COLONOSCOPY WITH PROPOFOL;  Surgeon: Jennifer Lame, MD;  Location: [facility];  Service: Endoscopy;  Laterality: N/A;   THYROIDECTOMY  1976   VITRECTOMY Right 2014   Family History  Problem Relation Age of Onset   Breast cancer Mother 65   Lung cancer Mother    Macular degeneration Mother    Sudden death Father         committed suicide   Diabetes Father    Lung disease Sister        legionaires   Hepatitis C Brother    Colon cancer Neg Hx     Allergies: Penicillins Current Outpatient Medications on File Prior to Visit  Medication Sig Dispense Refill   folic acid (FOLVITE) 1 MG tablet Take 1 mg by mouth daily.     Golimumab (SIMPONI ARIA IV)      levothyroxine (SYNTHROID) 125 MCG tablet TAKE 1 TABLET BY MOUTH EVERY DAY 90 tablet 0   methotrexate (RHEUMATREX) 2.5 MG tablet Take 1 tablet by mouth.     Multiple Vitamins-Minerals (PRESERVISION AREDS 2 PO) Take by mouth daily.     pantoprazole (PROTONIX) 40 MG tablet TAKE 1 TABLET BY MOUTH EVERYDAY AT BEDTIME 90 tablet 2   sertraline (ZOLOFT) 50 MG tablet TAKE 1 TABLET BY MOUTH EVERY DAY 90 tablet 0   triamterene-hydrochlorothiazide (MAXZIDE-25) 37.5-25 MG tablet TAKE 1 TABLET BY MOUTH EVERY DAY 90 tablet 0   No current facility-administered medications on file prior to visit.    Social History   Tobacco Use   Smoking status: Never   Smokeless tobacco: Never  Vaping Use   Vaping Use: Never used  Substance Use Topics   Alcohol use: Yes    Comment: socially   Drug use: No    Review of Systems  Constitutional:  Negative for chills  and fever.  Respiratory:  Negative for cough.   Cardiovascular:  Negative for chest pain and palpitations.  Gastrointestinal:  Negative for abdominal distention, abdominal pain, constipation (resolved), diarrhea (resolved), nausea and vomiting.     Objective:    BP 114/80 (BP Location: Left Arm, Patient Position: Sitting, Cuff Size: Large)   Pulse 87   Temp 97.8 F (36.6 C) (Oral)   Ht $R'5\' 3"'gp$  (1.6 m)   Wt 187 lb 9.6 oz (85.1 kg)   SpO2 96%   BMI 33.23 kg/m  BP Readings from Last 3 Encounters:  08/15/21 114/80  02/08/21 120/72  12/27/20 122/66   Wt Readings from Last 3 Encounters:  08/15/21 187 lb 9.6 oz (85.1 kg)  02/08/21 188 lb 12.8 oz (85.6 kg)  12/27/20 186 lb (84.4 kg)    Physical Exam Vitals  reviewed.  Constitutional:      Appearance: She is well-developed.  Eyes:     Conjunctiva/sclera: Conjunctivae normal.  Cardiovascular:     Rate and Rhythm: Normal rate and regular rhythm.     Pulses: Normal pulses.     Heart sounds: Normal heart sounds.  Pulmonary:     Effort: Pulmonary effort is normal.     Breath sounds: Normal breath sounds. No wheezing, rhonchi or rales.  Skin:    General: Skin is warm and dry.  Neurological:     Mental Status: She is alert.  Psychiatric:        Speech: Speech normal.        Behavior: Behavior normal.        Thought Content: Thought content normal.       Assessment & Plan:   Problem List Items Addressed This Visit       Cardiovascular and Mediastinum   Hypertension - Primary    Well controlled. Agreed we could do a trial stop of atenolol $RemoveBef'25mg'NcMPdFUOwP$  with close monitoring of blood pressure. Goal < 120/80.  She will continue  triamterene -hctz 37.5- $RemoveBef'25mg'MBznDppXmy$ .      Relevant Orders   CBC with Differential/Platelet     Endocrine   Hypothyroidism    Chronic, stable.  Updating TSH today.  Continue Synthroid 172mcg      Relevant Orders   TSH     Other   Depression    Chronic, stable. Continue zoloft $RemoveBeforeD'50mg'gfdnmCceIzUBtR$ .      Other Visit Diagnoses     Encounter for lipid screening for cardiovascular disease       Relevant Orders   Lipid panel   Asymptomatic menopausal state            I have discontinued Jennifer Patrick. Jennifer "[name]"'s mometasone, PreviDent 5000 Dry Mouth, Suprep Bowel Prep Kit, and atenolol. I am also having her maintain her Golimumab (SIMPONI ARIA IV), Multiple Vitamins-Minerals (PRESERVISION AREDS 2 PO), folic acid, methotrexate, sertraline, levothyroxine, triamterene-hydrochlorothiazide, and pantoprazole.   No orders of the defined types were placed in this encounter.   Return precautions given.   Risks, benefits, and alternatives of the medications and treatment plan prescribed today were discussed, and patient expressed  understanding.   Education regarding symptom management and diagnosis given to patient on AVS.  Continue to follow with Jennifer Hawthorne, FNP for routine health maintenance.   Jennifer Patrick and I agreed with plan.   Mable Paris, FNP

## 2021-08-15 NOTE — Patient Instructions (Signed)
As discussed, we will do a trial stop of atenolol 25 mg.  If the tablet is scored, you may start taking 12.5 mg once per day of atenolol.  After 1 week you may discontinue altogether.  It is imperative that you are seen AT least twice per year for labs and monitoring. Monitor blood pressure at home and me 5-6 reading on separate days. Goal is less than 120/80, based on newest guidelines, however we certainly want to be less than 130/80;  if persistently higher, please make sooner follow up appointment so we can recheck you blood pressure and manage/ adjust medications.  Managing Your Hypertension Hypertension, also called high blood pressure, is when the force of the blood pressing against the walls of the arteries is too strong. Arteries are blood vessels that carry blood from your heart throughout your body. Hypertension forces the heart to work harder to pump blood and may cause the arteries to become narrow or stiff. Understanding blood pressure readings Your personal target blood pressure may vary depending on your medical conditions, your age, and other factors. A blood pressure reading includes a higher number over a lower number. Ideally, your blood pressure should be below 120/80. You should know that: The first, or top, number is called the systolic pressure. It is a measure of the pressure in your arteries as your heart beats. The second, or bottom number, is called the diastolic pressure. It is a measure of the pressure in your arteries as the heart relaxes. Blood pressure is classified into four stages. Based on your blood pressure reading, your health care provider may use the following stages to determine what type of treatment you need, if any. Systolic pressure and diastolic pressure are measured in a unit called mmHg. Normal Systolic pressure: below 932. Diastolic pressure: below 80. Elevated Systolic pressure: 671-245. Diastolic pressure: below 80. Hypertension stage 1 Systolic  pressure: 809-983. Diastolic pressure: 38-25. Hypertension stage 2 Systolic pressure: 053 or above. Diastolic pressure: 90 or above. How can this condition affect me? Managing your hypertension is an important responsibility. Over time, hypertension can damage the arteries and decrease blood flow to important parts of the body, including the brain, heart, and kidneys. Having untreated or uncontrolled hypertension can lead to: A heart attack. A stroke. A weakened blood vessel (aneurysm). Heart failure. Kidney damage. Eye damage. Metabolic syndrome. Memory and concentration problems. Vascular dementia. What actions can I take to manage this condition? Hypertension can be managed by making lifestyle changes and possibly by taking medicines. Your health care provider will help you make a plan to bring your blood pressure within a normal range. Nutrition  Eat a diet that is high in fiber and potassium, and low in salt (sodium), added sugar, and fat. An example eating plan is called the Dietary Approaches to Stop Hypertension (DASH) diet. To eat this way: Eat plenty of fresh fruits and vegetables. Try to fill one-half of your plate at each meal with fruits and vegetables. Eat whole grains, such as whole-wheat pasta, brown rice, or whole-grain bread. Fill about one-fourth of your plate with whole grains. Eat low-fat dairy products. Avoid fatty cuts of meat, processed or cured meats, and poultry with skin. Fill about one-fourth of your plate with lean proteins such as fish, chicken without skin, beans, eggs, and tofu. Avoid pre-made and processed foods. These tend to be higher in sodium, added sugar, and fat. Reduce your daily sodium intake. Most people with hypertension should eat less than 1,500 mg of  sodium a day. Lifestyle  Work with your health care provider to maintain a healthy body weight or to lose weight. Ask what an ideal weight is for you. Get at least 30 minutes of exercise that  causes your heart to beat faster (aerobic exercise) most days of the week. Activities may include walking, swimming, or biking. Include exercise to strengthen your muscles (resistance exercise), such as weight lifting, as part of your weekly exercise routine. Try to do these types of exercises for 30 minutes at least 3 days a week. Do not use any products that contain nicotine or tobacco, such as cigarettes, e-cigarettes, and chewing tobacco. If you need help quitting, ask your health care provider. Control any long-term (chronic) conditions you have, such as high cholesterol or diabetes. Identify your sources of stress and find ways to manage stress. This may include meditation, deep breathing, or making time for fun activities. Alcohol use Do not drink alcohol if: Your health care provider tells you not to drink. You are pregnant, may be pregnant, or are planning to become pregnant. If you drink alcohol: Limit how much you use to: 0-1 drink a day for women. 0-2 drinks a day for men. Be aware of how much alcohol is in your drink. In the [place] one drink equals one 12 oz bottle of beer (355 mL), one 5 oz glass of wine (148 mL), or one 1 oz glass of hard liquor (44 mL). Medicines Your health care provider may prescribe medicine if lifestyle changes are not enough to get your blood pressure under control and if: Your systolic blood pressure is 130 or higher. Your diastolic blood pressure is 80 or higher. Take medicines only as told by your health care provider. Follow the directions carefully. Blood pressure medicines must be taken as told by your health care provider. The medicine does not work as well when you skip doses. Skipping doses also puts you at risk for problems. Monitoring Before you monitor your blood pressure: Do not smoke, drink caffeinated beverages, or exercise within 30 minutes before taking a measurement. Use the bathroom and empty your bladder (urinate). Sit quietly for at  least 5 minutes before taking measurements. Monitor your blood pressure at home as told by your health care provider. To do this: Sit with your back straight and supported. Place your feet flat on the floor. Do not cross your legs. Support your arm on a flat surface, such as a table. Make sure your upper arm is at heart level. Each time you measure, take two or three readings one minute apart and record the results. You may also need to have your blood pressure checked regularly by your health care provider. General information Talk with your health care provider about your diet, exercise habits, and other lifestyle factors that may be contributing to hypertension. Review all the medicines you take with your health care provider because there may be side effects or interactions. Keep all visits as told by your health care provider. Your health care provider can help you create and adjust your plan for managing your high blood pressure. Where to find more information National Heart, Lung, and Blood Institute: https://wilson-eaton.com/ American Heart Association: www.heart.org Contact a health care provider if: You think you are having a reaction to medicines you have taken. You have repeated (recurrent) headaches. You feel dizzy. You have swelling in your ankles. You have trouble with your vision. Get help right away if: You develop a severe headache or confusion. You have  unusual weakness or numbness, or you feel faint. You have severe pain in your chest or abdomen. You vomit repeatedly. You have trouble breathing. These symptoms may represent a serious problem that is an emergency. Do not wait to see if the symptoms will go away. Get medical help right away. Call your local emergency services (911 in the [place] Do not drive yourself to the hospital. Summary Hypertension is when the force of blood pumping through your arteries is too strong. If this condition is not controlled, it may put you at  risk for serious complications. Your personal target blood pressure may vary depending on your medical conditions, your age, and other factors. For most people, a normal blood pressure is less than 120/80. Hypertension is managed by lifestyle changes, medicines, or both. Lifestyle changes to help manage hypertension include losing weight, eating a healthy, low-sodium diet, exercising more, stopping smoking, and limiting alcohol. This information is not intended to replace advice given to you by your health care provider. Make sure you discuss any questions you have with your health care provider. Document Revised: 09/22/2019 Document Reviewed: 08/05/2019 Elsevier Patient Education  2022 [employer].

## 2021-08-17 ENCOUNTER — Encounter: Payer: Self-pay | Admitting: Gastroenterology

## 2021-08-17 ENCOUNTER — Other Ambulatory Visit: Payer: Self-pay

## 2021-08-17 ENCOUNTER — Other Ambulatory Visit: Payer: Self-pay | Admitting: Family

## 2021-08-17 ENCOUNTER — Ambulatory Visit (INDEPENDENT_AMBULATORY_CARE_PROVIDER_SITE_OTHER): Payer: Medicare Other | Admitting: Gastroenterology

## 2021-08-17 VITALS — BP 145/89 | HR 99 | Temp 97.1°F | Ht 63.0 in | Wt 188.8 lb

## 2021-08-17 DIAGNOSIS — R140 Abdominal distension (gaseous): Secondary | ICD-10-CM

## 2021-08-17 DIAGNOSIS — K5904 Chronic idiopathic constipation: Secondary | ICD-10-CM | POA: Diagnosis not present

## 2021-08-17 NOTE — Progress Notes (Signed)
Primary Care Physician: Burnard Hawthorne, FNP  Primary Gastroenterologist:  Dr. Lucilla Patrick  Chief Complaint  Patient presents with   New Patient (Initial Visit)    Abdominal pain     HPI: Jennifer Patrick is a 70 y.o. female here with a history of abdominal pain.  The patient was seen by her primary care provider and she had mentioned that the patient was seen in urgent care for constipation and was started on Colace.  The patient had reported to her primary care provider that the Colace was working as only she was taking every day.  The patient also had a colonoscopy by me back in April of this year with diverticulosis found at that time.  She had pain about 3 weeks ago and lasted a minute. She thought it may have been something she eat. She was feeling worse and went to the urgent care and was having diarrhea. She had an x-ray she was told she had large amounts of stools in her colon. She says she is not feeling any different on protonix then the prilosec.  She reports that she has heartburn when she lays down at night.  She usually goes to sleep very late.  There is no report of any unexplained weight loss fevers chills nausea vomiting black stools or bloody stools.  She states that she still feels bloated and gassy despite taking the Colace.  Past Medical History:  Diagnosis Date   Anxiety    Arthritis    Cataract    GERD (gastroesophageal reflux disease)    Hypertension    Hypothyroidism    Psoriasis    Dr. Nehemiah Massed, on embrel   Sleep apnea    Wears partial dentures    upper    Current Outpatient Medications  Medication Sig Dispense Refill   folic acid (FOLVITE) 1 MG tablet Take 1 mg by mouth daily.     Golimumab (SIMPONI ARIA IV)      levothyroxine (SYNTHROID) 125 MCG tablet TAKE 1 TABLET BY MOUTH EVERY DAY 90 tablet 0   methotrexate (RHEUMATREX) 2.5 MG tablet Take 1 tablet by mouth.     Multiple Vitamins-Minerals (PRESERVISION AREDS 2 PO) Take by mouth daily.      pantoprazole (PROTONIX) 40 MG tablet TAKE 1 TABLET BY MOUTH EVERYDAY AT BEDTIME 90 tablet 2   sertraline (ZOLOFT) 50 MG tablet TAKE 1 TABLET BY MOUTH EVERY DAY 90 tablet 0   triamterene-hydrochlorothiazide (MAXZIDE-25) 37.5-25 MG tablet TAKE 1 TABLET BY MOUTH EVERY DAY 90 tablet 0   No current facility-administered medications for this visit.    Allergies as of 08/17/2021 - Review Complete 08/17/2021  Allergen Reaction Noted   Penicillins  07/03/2011    ROS:  General: Negative for anorexia, weight loss, fever, chills, fatigue, weakness. ENT: Negative for hoarseness, difficulty swallowing , nasal congestion. CV: Negative for chest pain, angina, palpitations, dyspnea on exertion, peripheral edema.  Respiratory: Negative for dyspnea at rest, dyspnea on exertion, cough, sputum, wheezing.  GI: See history of present illness. GU:  Negative for dysuria, hematuria, urinary incontinence, urinary frequency, nocturnal urination.  Endo: Negative for unusual weight change.    Physical Examination:   BP (!) 145/89 (BP Location: Left Arm, Patient Position: Sitting, Cuff Size: Large)   Pulse 99   Temp (!) 97.1 F (36.2 C) (Temporal)   Ht 5\' 3"  (1.6 m)   Wt 188 lb 12.8 oz (85.6 kg)   BMI 33.44 kg/m   General: Well-nourished, well-developed in  no acute distress.  Eyes: No icterus. Conjunctivae pink. Lungs: Clear to auscultation bilaterally. Non-labored. Heart: Regular rate and rhythm, no murmurs rubs or gallops.  Abdomen: Bowel sounds are normal, nontender, nondistended, no hepatosplenomegaly or masses, no abdominal bruits or hernia , no rebound or guarding.   Extremities: No lower extremity edema. No clubbing or deformities. Neuro: Alert and oriented x 3.  Grossly intact. Skin: Warm and dry, no jaundice.   Psych: Alert and cooperative, normal mood and affect.  Labs:    Imaging Studies: No results found.  Assessment and Plan:   Jennifer Patrick is a 70 y.o. y/o female who comes  in today with a history of abdominal bloating a change in bowel habits with a history of having urgency and looser bowel movements.  The patient likely had irritable bowel syndrome with diarrhea predominance that has changed to constipation predominance.  Changing from one form of IBS to another is not uncommon.  The patient has been explained this.  The patient had a colonoscopy earlier this year and is not in need of another one.  She has been told to take the Protonix closer to the time she goes to sleep to get maximal acid suppression.  She has also been told to add Citrucel to her daily routine to help better evacuate her colon of stool and thereby decreasing her gas and bloating.  The patient has been explained the plan and agrees with it.     Jennifer Lame, MD. [name]    Note: This dictation was prepared with Dragon dictation along with smaller phrase technology. Any transcriptional errors that result from this process are unintentional.

## 2021-08-18 ENCOUNTER — Other Ambulatory Visit: Payer: Self-pay

## 2021-08-18 ENCOUNTER — Telehealth: Payer: Self-pay

## 2021-08-18 DIAGNOSIS — E039 Hypothyroidism, unspecified: Secondary | ICD-10-CM

## 2021-08-18 NOTE — Telephone Encounter (Signed)
LMTCB for labs. 

## 2021-08-22 DIAGNOSIS — Z111 Encounter for screening for respiratory tuberculosis: Secondary | ICD-10-CM | POA: Diagnosis not present

## 2021-08-22 DIAGNOSIS — Z79899 Other long term (current) drug therapy: Secondary | ICD-10-CM | POA: Diagnosis not present

## 2021-08-22 DIAGNOSIS — R5383 Other fatigue: Secondary | ICD-10-CM | POA: Diagnosis not present

## 2021-08-22 DIAGNOSIS — L4050 Arthropathic psoriasis, unspecified: Secondary | ICD-10-CM | POA: Diagnosis not present

## 2021-09-23 ENCOUNTER — Other Ambulatory Visit: Payer: Self-pay | Admitting: Family

## 2021-10-04 ENCOUNTER — Other Ambulatory Visit: Payer: Self-pay

## 2021-10-04 ENCOUNTER — Other Ambulatory Visit (INDEPENDENT_AMBULATORY_CARE_PROVIDER_SITE_OTHER): Payer: Medicare Other

## 2021-10-04 DIAGNOSIS — E039 Hypothyroidism, unspecified: Secondary | ICD-10-CM | POA: Diagnosis not present

## 2021-10-04 LAB — TSH: TSH: 5.11 u[IU]/mL (ref 0.35–5.50)

## 2021-10-17 DIAGNOSIS — L4050 Arthropathic psoriasis, unspecified: Secondary | ICD-10-CM | POA: Diagnosis not present

## 2021-10-19 ENCOUNTER — Other Ambulatory Visit: Payer: Self-pay

## 2021-10-19 ENCOUNTER — Ambulatory Visit
Admission: RE | Admit: 2021-10-19 | Discharge: 2021-10-19 | Disposition: A | Discharge status: Home / Self Care | Payer: Medicare Other | Source: Ambulatory Visit | Attending: Family | Admitting: Family

## 2021-10-19 DIAGNOSIS — Z1231 Encounter for screening mammogram for malignant neoplasm of breast: Secondary | ICD-10-CM | POA: Insufficient documentation

## 2021-11-17 DIAGNOSIS — R52 Pain, unspecified: Secondary | ICD-10-CM | POA: Diagnosis not present

## 2021-11-17 DIAGNOSIS — E669 Obesity, unspecified: Secondary | ICD-10-CM | POA: Diagnosis not present

## 2021-11-17 DIAGNOSIS — R7989 Other specified abnormal findings of blood chemistry: Secondary | ICD-10-CM | POA: Diagnosis not present

## 2021-11-17 DIAGNOSIS — M79675 Pain in left toe(s): Secondary | ICD-10-CM | POA: Diagnosis not present

## 2021-11-17 DIAGNOSIS — Z6833 Body mass index (BMI) 33.0-33.9, adult: Secondary | ICD-10-CM | POA: Diagnosis not present

## 2021-11-17 DIAGNOSIS — L401 Generalized pustular psoriasis: Secondary | ICD-10-CM | POA: Diagnosis not present

## 2021-11-17 DIAGNOSIS — L4050 Arthropathic psoriasis, unspecified: Secondary | ICD-10-CM | POA: Diagnosis not present

## 2021-12-06 DIAGNOSIS — H35033 Hypertensive retinopathy, bilateral: Secondary | ICD-10-CM | POA: Diagnosis not present

## 2021-12-06 DIAGNOSIS — H542X21 Low vision right eye category 2, low vision left eye category 1: Secondary | ICD-10-CM | POA: Diagnosis not present

## 2021-12-06 DIAGNOSIS — H353134 Nonexudative age-related macular degeneration, bilateral, advanced atrophic with subfoveal involvement: Secondary | ICD-10-CM | POA: Diagnosis not present

## 2021-12-07 DIAGNOSIS — H542X21 Low vision right eye category 2, low vision left eye category 1: Secondary | ICD-10-CM | POA: Diagnosis not present

## 2021-12-07 DIAGNOSIS — H35033 Hypertensive retinopathy, bilateral: Secondary | ICD-10-CM | POA: Diagnosis not present

## 2021-12-07 DIAGNOSIS — H353134 Nonexudative age-related macular degeneration, bilateral, advanced atrophic with subfoveal involvement: Secondary | ICD-10-CM | POA: Diagnosis not present

## 2021-12-14 ENCOUNTER — Ambulatory Visit (INDEPENDENT_AMBULATORY_CARE_PROVIDER_SITE_OTHER): Payer: Medicare Other

## 2021-12-14 VITALS — Ht 63.0 in | Wt 188.0 lb

## 2021-12-14 DIAGNOSIS — L4050 Arthropathic psoriasis, unspecified: Secondary | ICD-10-CM | POA: Diagnosis not present

## 2021-12-14 DIAGNOSIS — Z79899 Other long term (current) drug therapy: Secondary | ICD-10-CM | POA: Diagnosis not present

## 2021-12-14 DIAGNOSIS — Z0000 Encounter for general adult medical examination without abnormal findings: Secondary | ICD-10-CM | POA: Diagnosis not present

## 2021-12-14 DIAGNOSIS — Z111 Encounter for screening for respiratory tuberculosis: Secondary | ICD-10-CM | POA: Diagnosis not present

## 2021-12-14 DIAGNOSIS — R5383 Other fatigue: Secondary | ICD-10-CM | POA: Diagnosis not present

## 2021-12-14 NOTE — Patient Instructions (Addendum)
?  Ms. Wesche , ?Thank you for taking time to come for your Medicare Wellness Visit. I appreciate your ongoing commitment to your health goals. Please review the following plan we discussed and let me know if I can assist you in the future.  ? ?These are the goals we discussed: ? Goals   ? ?  ? Patient Stated  ?   Maintain Healthy Lifestyle (pt-stated)   ?   Stay active. ?Healthy diet. ?Stay hydrated. ?  ? ?  ?  ?This is a list of the screening recommended for you and due dates:  ?Health Maintenance  ?Topic Date Due  ? Zoster (Shingles) Vaccine (1 of 2) 03/16/2022*  ? Tetanus Vaccine  12/15/2022*  ? Mammogram  10/20/2023  ? Colon Cancer Screening  12/28/2030  ? Pneumonia Vaccine  Completed  ? DEXA scan (bone density measurement)  Completed  ? COVID-19 Vaccine  Completed  ? Hepatitis C Screening: USPSTF Recommendation to screen - Ages 71-79 yo.  Completed  ? HPV Vaccine  Aged Out  ? Flu Shot  Discontinued  ? Cologuard (Stool DNA test)  Discontinued  ?*Topic was postponed. The date shown is not the original due date.  ?  ?

## 2021-12-14 NOTE — Progress Notes (Signed)
Subjective:   Jennifer Patrick is a 71 y.o. female who presents for Medicare Annual (Subsequent) preventive examination.  Review of Systems    No ROS.  Medicare Wellness Virtual Visit.  Visual/audio telehealth visit, UTA vital signs.   See social history for additional risk factors.   Cardiac Risk Factors include: advanced age (>67men, >30 women);hypertension     Objective:    Today's Vitals   12/14/21 1308  Weight: 188 lb (85.3 kg)  Height: 5\' 3"  (1.6 m)   Body mass index is 33.3 kg/m.     12/14/2021    1:20 PM 12/27/2020   10:09 AM 06/03/2020    9:33 AM 05/31/2018    9:56 AM 12/16/2014    9:46 AM  Advanced Directives  Does Patient Have a Medical Advance Directive? No No No No No  Would patient like information on creating a medical advance directive? No - Patient declined No - Patient declined No - Patient declined Yes (MAU/Ambulatory/Procedural Areas - Information given) No - patient declined information    Current Medications (verified) Outpatient Encounter Medications as of 12/14/2021  Medication Sig   omeprazole (PRILOSEC) 40 MG capsule Take 40 mg by mouth daily.   folic acid (FOLVITE) 1 MG tablet Take 1 mg by mouth daily.   Golimumab (SIMPONI ARIA IV)    levothyroxine (SYNTHROID) 125 MCG tablet TAKE 1 TABLET BY MOUTH EVERY DAY   methotrexate (RHEUMATREX) 2.5 MG tablet Take 1 tablet by mouth.   Multiple Vitamins-Minerals (PRESERVISION AREDS 2 PO) Take by mouth daily.   pantoprazole (PROTONIX) 40 MG tablet TAKE 1 TABLET BY MOUTH EVERYDAY AT BEDTIME (Patient not taking: Reported on 12/14/2021)   sertraline (ZOLOFT) 50 MG tablet TAKE 1 TABLET BY MOUTH EVERY DAY   triamterene-hydrochlorothiazide (MAXZIDE-25) 37.5-25 MG tablet TAKE 1 TABLET BY MOUTH EVERY DAY   No facility-administered encounter medications on file as of 12/14/2021.    Allergies (verified) Penicillins   History: Past Medical History:  Diagnosis Date   Anxiety    Arthritis    Cataract    GERD  (gastroesophageal reflux disease)    Hypertension    Hypothyroidism    Psoriasis    Dr. Gwen Pounds, on embrel   Sleep apnea    Wears partial dentures    upper   Past Surgical History:  Procedure Laterality Date   ABDOMINAL HYSTERECTOMY  1999   for abnormal pap, [facility]. No ovaries or cervix.    BREAST EXCISIONAL BIOPSY Left 1981   benign   CATARACT EXTRACTION Bilateral    COLONOSCOPY WITH PROPOFOL N/A 12/27/2020   Procedure: COLONOSCOPY WITH PROPOFOL;  Surgeon: Midge Minium, MD;  Location: [facility];  Service: Endoscopy;  Laterality: N/A;   THYROIDECTOMY  1976   VITRECTOMY Right 2014   Family History  Problem Relation Age of Onset   Breast cancer Mother 81   Lung cancer Mother    Macular degeneration Mother    Sudden death Father        committed suicide   Diabetes Father    Lung disease Sister        legionaires   Hepatitis C Brother    Colon cancer Neg Hx    Social History   Socioeconomic History   Marital status: Widowed    Spouse name: Not on file   Number of children: 2   Years of education: Not on file   Highest education level: Not on file  Occupational History   Occupation: retired  Tobacco Use  Smoking status: Never   Smokeless tobacco: Never  Vaping Use   Vaping Use: Never used  Substance and Sexual Activity   Alcohol use: Yes    Comment: socially   Drug use: No   Sexual activity: Not on file  Other Topics Concern   Not on file  Social History Narrative   Works at [employer], 2014. Lives in [place].   Enjoys going to [employer] [employer]. Grandchildren in [place].       Has son and daughter.   Lives alone, widow since 2012.       Social Determinants of Health   Financial Resource Strain: Low Risk    Difficulty of Paying Living Expenses: Not hard at all  Food Insecurity: No Food Insecurity   Worried About Programme researcher, broadcasting/film/video in the Last Year: Never true   Ran Out of Food in the Last Year: Never true  Transportation Needs: No  Transportation Needs   Lack of Transportation (Medical): No   Lack of Transportation (Non-Medical): No  Physical Activity: Unknown   Days of Exercise per Week: 0 days   Minutes of Exercise per Session: Not on file  Stress: No Stress Concern Present   Feeling of Stress : Not at all  Social Connections: Unknown   Frequency of Communication with Friends and Family: Not on file   Frequency of Social Gatherings with Friends and Family: More than three times a week   Attends Religious Services: Not on file   Active Member of Clubs or Organizations: Yes   Attends Banker Meetings: More than 4 times per year   Marital Status: Widowed    Tobacco Counseling Counseling given: Not Answered   Clinical Intake:  Pre-visit preparation completed: Yes        Diabetes: No  How often do you need to have someone help you when you read instructions, pamphlets, or other written materials from your doctor or pharmacy?: 1 - Never  Interpreter Needed?: No      Activities of Daily Living    12/14/2021    1:16 PM 12/27/2020   10:11 AM  In your present state of health, do you have any difficulty performing the following activities:  Hearing? 0 0  Vision? 0 0  Difficulty concentrating or making decisions? 0 0  Walking or climbing stairs? 0 0  Dressing or bathing? 0 0  Doing errands, shopping? 0   Preparing Food and eating ? N   Using the Toilet? N   In the past six months, have you accidently leaked urine? N   Do you have problems with loss of bowel control? N   Managing your Medications? N   Managing your Finances? N   Housekeeping or managing your Housekeeping? N     Patient Care Team: Allegra Grana, FNP as PCP - General (Family Medicine) Shelia Media, MD (Internal Medicine)  Indicate any recent Medical Services you may have received from other than [facility] providers in the past year (date may be approximate).     Assessment:   This is a routine wellness  examination for [place].  Virtual Visit via Telephone Note  I connected with  KENZINGTON KOETZ on 12/14/21 at  1:00 PM EDT by telephone and verified that I am speaking with the correct person using two identifiers.  Persons participating in the virtual visit: patient/Nurse Health Advisor   I discussed the limitations of performing an evaluation and management service by telehealth. The patient expressed understanding  and agreed to proceed. We continued and completed visit with audio only. Some vital signs may be absent or patient reported.   Hearing/Vision screen Hearing Screening - Comments:: Patient is able to hear conversational tones without difficulty. No issues reported. Vision Screening - Comments:: Follow[facility] Wears corrective lenses  Macular dystrophy  Cataract extraction, bilateral  They have regular follow up with the ophthalmologist  Dietary issues and exercise activities discussed: Current Exercise Habits: Home exercise routine, Type of exercise: walking (Stair climbing), Intensity: Mild Healthy diet   Goals Addressed               This Visit's Progress     Patient Stated     Maintain Healthy Lifestyle (pt-stated)        Stay active. Healthy diet. Stay hydrated.       Depression Screen    12/14/2021    1:14 PM 08/15/2021    8:59 AM 06/03/2020    9:39 AM 06/03/2019    9:46 AM 09/02/2018    4:30 PM 05/31/2018   10:02 AM 10/25/2016    9:22 AM  PHQ 2/9 Scores  PHQ - 2 Score 0 0 0 0 0 0 0  PHQ- 9 Score     1      Fall Risk    12/14/2021    1:16 PM 08/15/2021    8:59 AM 06/03/2020    9:39 AM 01/19/2020    8:44 AM 06/03/2019    9:46 AM  Fall Risk   Falls in the past year? 0 0 0 0 0  Number falls in past yr:   0    Follow up Falls evaluation completed Falls evaluation completed Falls evaluation completed Falls evaluation completed     FALL RISK PREVENTION PERTAINING TO THE HOME: Home free of loose throw rugs in walkways, pet beds,  electrical cords, etc? Yes  Adequate lighting in your home to reduce risk of falls? Yes   ASSISTIVE DEVICES UTILIZED TO PREVENT FALLS: Smart watch life alert? Yes  Use of a cane, walker or w/c? No  Grab bars in the bathroom? Yes  Shower chair or bench in shower? Yes  Comfort chair height toilet seat? Yes   TIMED UP AND GO: Was the test performed? No .   Cognitive Function: Patient is alert and oriented x3.  Normal cognitive status assessed by direct observation/communication. No abnormalities found.      05/31/2018   10:06 AM 12/16/2014    9:54 AM  MMSE - Mini Mental State Exam  Orientation to time 5 5  Orientation to Place 5 5  Registration 3 3  Attention/ Calculation 5 5  Recall 3 3  Language- name 2 objects 2 2  Language- repeat 1 1  Language- follow 3 step command 3 3  Language- read & follow direction 1 1  Write a sentence 1 1  Copy design 1 1  Total score 30 30        06/03/2019   10:02 AM  6CIT Screen  What Year? 0 points  What month? 0 points  What time? 0 points  Count back from 20 0 points  Months in reverse 0 points  Repeat phrase 0 points  Total Score 0 points    Immunizations Immunization History  Administered Date(s) Administered   Fluad Quad(high Dose 65+) 06/10/2019   Influenza Whole 07/03/2011, 07/07/2012   Influenza, High Dose Seasonal PF 07/11/2020   Influenza,inj,Quad PF,6+ Mos 07/14/2015   Influenza-Unspecified 07/14/2013, 06/19/2016,  06/10/2017, 06/03/2019   Moderna Covid-19 Vaccine Bivalent Booster 17yrs & up 07/20/2021   Moderna Sars-Covid-2 Vaccination 10/25/2019, 11/24/2019, 07/11/2020, 02/25/2021   Pneumococcal Conjugate-13 12/12/2013, 06/10/2019   Pneumococcal Polysaccharide-23 12/16/2014   Tdap 07/07/2010   Zoster, Live 10/05/2011   TDAP status: Due, Education has been provided regarding the importance of this vaccine. Advised may receive this vaccine at local pharmacy or Health Dept. Aware to provide a copy of the vaccination  record if obtained from local pharmacy or Health Dept. Verbalized acceptance and understanding. Deferred.   Shingrix Completed?: No.    Education has been provided regarding the importance of this vaccine. Patient has been advised to call insurance company to determine out of pocket expense if they have not yet received this vaccine. Advised may also receive vaccine at local pharmacy or Health Dept. Verbalized acceptance and understanding.  Screening Tests Health Maintenance  Topic Date Due   Zoster Vaccines- Shingrix (1 of 2) 03/16/2022 (Originally 11/25/1969)   TETANUS/TDAP  12/15/2022 (Originally 07/07/2020)   MAMMOGRAM  10/20/2023   COLONOSCOPY (Pts 45-28yrs Insurance coverage will need to be confirmed)  12/28/2030   Pneumonia Vaccine 73+ Years old  Completed   DEXA SCAN  Completed   COVID-19 Vaccine  Completed   Hepatitis C Screening  Completed   HPV VACCINES  Aged Out   INFLUENZA VACCINE  Discontinued   Fecal DNA (Cologuard)  Discontinued   Health Maintenance There are no preventive care reminders to display for this patient.  Lung Cancer Screening: (Low Dose CT Chest recommended if Age 84-80 years, 30 pack-year currently smoking OR have quit w/in 15years.) does not qualify.   Vision Screening: Recommended annual ophthalmology exams for early detection of glaucoma and other disorders of the eye.  Dental Screening: Recommended annual dental exams for proper oral hygiene  Community Resource Referral / Chronic Care Management: CRR required this visit?  No   CCM required this visit?  No      Plan:   Keep all routine maintenance appointments.   I have personally reviewed and noted the following in the patient's chart:   Medical and social history Use of alcohol, tobacco or illicit drugs  Current medications and supplements including opioid prescriptions.  Functional ability and status Nutritional status Physical activity Advanced directives List of other  physicians Hospitalizations, surgeries, and ER visits in previous 12 months Vitals Screenings to include cognitive, depression, and falls Referrals and appointments  In addition, I have reviewed and discussed with patient certain preventive protocols, quality metrics, and best practice recommendations. A written personalized care plan for preventive services as well as general preventive health recommendations were provided to patient.     Ashok Pall, LPN   [id number]

## 2021-12-22 ENCOUNTER — Other Ambulatory Visit: Payer: Self-pay | Admitting: Family

## 2021-12-28 ENCOUNTER — Ambulatory Visit (INDEPENDENT_AMBULATORY_CARE_PROVIDER_SITE_OTHER): Payer: Medicare Other | Admitting: Gastroenterology

## 2021-12-28 ENCOUNTER — Encounter: Payer: Self-pay | Admitting: Gastroenterology

## 2021-12-28 VITALS — BP 130/79 | HR 82 | Temp 97.9°F | Wt 191.0 lb

## 2021-12-28 DIAGNOSIS — K219 Gastro-esophageal reflux disease without esophagitis: Secondary | ICD-10-CM

## 2021-12-28 MED ORDER — DEXLANSOPRAZOLE 60 MG PO CPDR: 60.0000 mg | DELAYED_RELEASE_CAPSULE | Freq: Every day | ORAL | 11 refills | Status: DC

## 2021-12-28 NOTE — Patient Instructions (Signed)
Please give me update on or after January 09, 2022 ?

## 2021-12-28 NOTE — Progress Notes (Signed)
? ? ?Primary Care Physician: Burnard Hawthorne, FNP ? ?Primary Gastroenterologist:  Dr. [name] ? ?Chief Complaint  ?Patient presents with  ? Gastroesophageal Reflux  ?  Switched to Omeprazole 2-3 weeks ago from Pantoprazole   ? ? ?HPI: SAKI LEGORE is a 71 y.o. female here follow-up of GERD.  The patient has been seen in the past with a history of irritable bowel syndrome with diarrhea predominance that has changed to constipation predominance.  The patient also had acid breakthrough when she would lay down at night and was switched to pantoprazole from her Prilosec and did not notice any difference therefore the pantoprazole was switched to be taken just before she went to sleep. ?She switch back to the Omeprazole with some improvement.  The patient continues to have acid breakthrough at night and continues to go to sleep at a very late hour.  She also reports that she has had some dysphagia with food going down slowly and feels like food is stuck in her esophagus.  There is no report of any unexplained weight loss black stools or bloody stools ? ?Past Medical History:  ?Diagnosis Date  ? Anxiety   ? Arthritis   ? Cataract   ? GERD (gastroesophageal reflux disease)   ? Hypertension   ? Hypothyroidism   ? Psoriasis   ? Dr. Nehemiah Massed, on embrel  ? Sleep apnea   ? Wears partial dentures   ? upper  ? ? ?Current Outpatient Medications  ?Medication Sig Dispense Refill  ? atenolol (TENORMIN) 25 MG tablet Take by mouth daily.    ? dexlansoprazole (DEXILANT) 60 MG capsule Take 1 capsule (60 mg total) by mouth daily. 30 capsule 11  ? folic acid (FOLVITE) 1 MG tablet Take 1 mg by mouth daily.    ? Golimumab ([place] ARIA IV)     ? levothyroxine (SYNTHROID) 125 MCG tablet TAKE 1 TABLET BY MOUTH EVERY DAY 90 tablet 0  ? methotrexate (RHEUMATREX) 2.5 MG tablet Take 1 tablet by mouth.    ? Multiple Vitamins-Minerals (PRESERVISION AREDS 2 PO) Take by mouth daily.    ? omeprazole (PRILOSEC) 40 MG capsule Take 40 mg by  mouth daily.    ? sertraline (ZOLOFT) 50 MG tablet TAKE 1 TABLET BY MOUTH EVERY DAY 90 tablet 0  ? triamterene-hydrochlorothiazide (MAXZIDE-25) 37.5-25 MG tablet TAKE 1 TABLET BY MOUTH EVERY DAY 90 tablet 0  ? ?No current facility-administered medications for this visit.  ? ? ?Allergies as of 12/28/2021 - Review Complete 12/14/2021  ?Allergen Reaction Noted  ? Penicillins  07/03/2011  ? ? ?ROS: ? ?General: Negative for anorexia, weight loss, fever, chills, fatigue, weakness. ?ENT: Negative for hoarseness, difficulty swallowing , nasal congestion. ?CV: Negative for chest pain, angina, palpitations, dyspnea on exertion, peripheral edema.  ?Respiratory: Negative for dyspnea at rest, dyspnea on exertion, cough, sputum, wheezing.  ?GI: See history of present illness. ?GU:  Negative for dysuria, hematuria, urinary incontinence, urinary frequency, nocturnal urination.  ?Endo: Negative for unusual weight change.  ?  ?Physical Examination: ? ? BP 130/79   Pulse 82   Temp 97.9 ?F (36.6 ?C) (Oral)   Wt 191 lb (86.6 kg)   BMI 33.83 kg/m?  ? ?General: Well-nourished, well-developed in no acute distress.  ?Eyes: No icterus. Conjunctivae pink. ?Lungs: Clear to auscultation bilaterally. Non-labored. ?Heart: Regular rate and rhythm, no murmurs rubs or gallops.  ?Abdomen: Bowel sounds are normal, nontender, nondistended, no hepatosplenomegaly or masses, no abdominal bruits or hernia , no rebound  or guarding.   ?Extremities: No lower extremity edema. No clubbing or deformities. ?Neuro: Alert and oriented x 3.  Grossly intact. ?Skin: Warm and dry, no jaundice.   ?Psych: Alert and cooperative, normal mood and affect. ? ?Labs:  ?  ?Imaging Studies: ?No results found. ? ?Assessment and Plan:  ? ?SALIMATOU SIMONE is a 71 y.o. y/o female who comes in today with a history of reflux not being helped by pantoprazole or omeprazole.  The patient will be started on Dexilant to see if this helps her symptoms.  She will be set up for an  upper endoscopy to rule out any strictures or narrowing and will also have a pH study done at that time.  If the Dexilant works for her and she contacts me stating that the acid reflux is stopped then she will only have the upper endoscopy and not the Bravo pH study.  The patient has been explained the plan agrees with it ? ? ? ? ?[name], MD. [name] ? ? ? Note: This dictation was prepared with Dragon dictation along with smaller phrase technology. Any transcriptional errors that result from this process are unintentional.  ?

## 2021-12-29 ENCOUNTER — Encounter: Payer: Self-pay | Admitting: Gastroenterology

## 2022-02-02 ENCOUNTER — Encounter
Admission: RE | Disposition: A | Discharge status: Home / Self Care | Payer: Self-pay | Source: Home / Self Care | Attending: Gastroenterology

## 2022-02-02 ENCOUNTER — Encounter: Payer: Self-pay | Admitting: Gastroenterology

## 2022-02-02 ENCOUNTER — Ambulatory Visit: Payer: Medicare Other | Admitting: Certified Registered"

## 2022-02-02 ENCOUNTER — Ambulatory Visit
Admission: RE | Admit: 2022-02-02 | Discharge: 2022-02-02 | Disposition: A | Discharge status: Home / Self Care | Payer: Medicare Other | Attending: Gastroenterology | Admitting: Gastroenterology

## 2022-02-02 DIAGNOSIS — K219 Gastro-esophageal reflux disease without esophagitis: Secondary | ICD-10-CM

## 2022-02-02 DIAGNOSIS — R12 Heartburn: Secondary | ICD-10-CM | POA: Diagnosis present

## 2022-02-02 DIAGNOSIS — K222 Esophageal obstruction: Secondary | ICD-10-CM | POA: Insufficient documentation

## 2022-02-02 DIAGNOSIS — K449 Diaphragmatic hernia without obstruction or gangrene: Secondary | ICD-10-CM | POA: Diagnosis not present

## 2022-02-02 HISTORY — PX: BRAVO PH STUDY: SHX5421

## 2022-02-02 HISTORY — PX: ESOPHAGOGASTRODUODENOSCOPY: SHX5428

## 2022-02-02 SURGERY — PH MONITORING, ESOPHAGUS, WIRELESS
Anesthesia: General

## 2022-02-02 MED ORDER — GLYCOPYRROLATE 0.2 MG/ML IJ SOLN
INTRAMUSCULAR | Status: AC
  Filled 2022-02-02: qty 1

## 2022-02-02 MED ORDER — PROPOFOL 500 MG/50ML IV EMUL
INTRAVENOUS | Status: AC
  Filled 2022-02-02: qty 150

## 2022-02-02 MED ORDER — GLYCOPYRROLATE 0.2 MG/ML IJ SOLN
INTRAMUSCULAR | Status: DC | PRN
  Administered 2022-02-02: .2 mg via INTRAVENOUS

## 2022-02-02 MED ORDER — SODIUM CHLORIDE 0.9 % IV SOLN: INTRAVENOUS | Status: DC

## 2022-02-02 MED ORDER — PROPOFOL 10 MG/ML IV BOLUS
INTRAVENOUS | Status: DC | PRN
Start: 2022-02-02 — End: 2022-02-02
  Administered 2022-02-02: 30 mg via INTRAVENOUS
  Administered 2022-02-02: 70 mg via INTRAVENOUS

## 2022-02-02 MED ORDER — LIDOCAINE 2% (20 MG/ML) 5 ML SYRINGE
INTRAMUSCULAR | Status: DC | PRN
  Administered 2022-02-02: 20 mg via INTRAVENOUS

## 2022-02-02 MED ORDER — LIDOCAINE HCL (PF) 2 % IJ SOLN
INTRAMUSCULAR | Status: AC
  Filled 2022-02-02: qty 5

## 2022-02-02 MED ORDER — PROPOFOL 500 MG/50ML IV EMUL
INTRAVENOUS | Status: DC | PRN
  Administered 2022-02-02: 120 ug/kg/min via INTRAVENOUS

## 2022-02-02 NOTE — Progress Notes (Signed)
Educated via video and explanation on how to document for Bravo study. Patient understands

## 2022-02-02 NOTE — H&P (Signed)
Jennifer Lame, MD [address] [place], [place] [address] [phone] Fax : [phone]  Primary Care Physician:  Burnard Hawthorne, FNP Primary Gastroenterologist:  Dr. Allen Norris  Pre-Procedure History & Physical: HPI:  Jennifer Patrick is a 71 y.o. female is here for an endoscopy.   Past Medical History:  Diagnosis Date   Anxiety    Arthritis    Cataract    GERD (gastroesophageal reflux disease)    Hypertension    Hypothyroidism    Psoriasis    Dr. Nehemiah Massed, on embrel   Sleep apnea    Wears partial dentures    upper    Past Surgical History:  Procedure Laterality Date   ABDOMINAL HYSTERECTOMY  1999   for abnormal pap, [facility]. No ovaries or cervix.    BREAST EXCISIONAL BIOPSY Left 1981   benign   CATARACT EXTRACTION Bilateral    COLONOSCOPY WITH PROPOFOL N/A 12/27/2020   Procedure: COLONOSCOPY WITH PROPOFOL;  Surgeon: Jennifer Lame, MD;  Location: [facility];  Service: Endoscopy;  Laterality: N/A;   THYROIDECTOMY  1976   VITRECTOMY Right 2014    Prior to Admission medications   Medication Sig Start Date End Date Taking? Authorizing Provider  atenolol (TENORMIN) 25 MG tablet Take by mouth daily.   Yes [provider]  dexlansoprazole (DEXILANT) 60 MG capsule Take 1 capsule (60 mg total) by mouth daily. 12/28/21  Yes Jennifer Lame, MD  levothyroxine (SYNTHROID) 125 MCG tablet TAKE 1 TABLET BY MOUTH EVERY DAY 12/26/21  Yes Burnard Hawthorne, FNP  sertraline (ZOLOFT) 50 MG tablet TAKE 1 TABLET BY MOUTH EVERY DAY 12/26/21  Yes Burnard Hawthorne, FNP  triamterene-hydrochlorothiazide (MAXZIDE-25) 37.5-25 MG tablet TAKE 1 TABLET BY MOUTH EVERY DAY 12/26/21  Yes Arnett, Yvetta Coder, FNP  folic acid (FOLVITE) 1 MG tablet Take 1 mg by mouth daily. 02/03/21   [provider]  Golimumab (Rives ARIA IV)  10/19/16   [provider]  methotrexate (RHEUMATREX) 2.5 MG tablet Take 1 tablet by mouth. 02/03/21   [provider]   Multiple Vitamins-Minerals (PRESERVISION AREDS 2 PO) Take by mouth daily.    [provider]  omeprazole (PRILOSEC) 40 MG capsule Take 40 mg by mouth daily.    [provider]    Allergies as of 12/28/2021 - Review Complete 12/14/2021  Allergen Reaction Noted   Penicillins  07/03/2011    Family History  Problem Relation Age of Onset   Breast cancer Mother 41   Lung cancer Mother    Macular degeneration Mother    Sudden death Father        committed suicide   Diabetes Father    Lung disease Sister        legionaires   Hepatitis C Brother    Colon cancer Neg Hx     Social History   Socioeconomic History   Marital status: Widowed    Spouse name: Not on file   Number of children: 2   Years of education: Not on file   Highest education level: Not on file  Occupational History   Occupation: retired  Tobacco Use   Smoking status: Never   Smokeless tobacco: Never  Vaping Use   Vaping Use: Never used  Substance and Sexual Activity   Alcohol use: Yes    Comment: socially   Drug use: No   Sexual activity: Not on file  Other Topics Concern   Not on file  Social History Narrative  Works at [employer], 2014. Lives in [place].   Enjoys going to [employer]. Grandchildren in [place].       Has son and daughter.   Lives alone, widow since 2012.       Social Determinants of Health   Financial Resource Strain: Low Risk    Difficulty of Paying Living Expenses: Not hard at all  Food Insecurity: No Food Insecurity   Worried About Charity fundraiser in the Last Year: Never true   [place] in the Last Year: Never true  Transportation Needs: No Transportation Needs   Lack of Transportation (Medical): No   Lack of Transportation (Non-Medical): No  Physical Activity: Unknown   Days of Exercise per Week: 0 days   Minutes of Exercise per Session: Not on file  Stress: No Stress Concern Present   Feeling of Stress : Not at all  Social  Connections: Unknown   Frequency of Communication with Friends and Family: Not on file   Frequency of Social Gatherings with Friends and Family: More than three times a week   Attends Religious Services: Not on file   Active Member of Clubs or Organizations: Yes   Attends Archivist Meetings: More than 4 times per year   Marital Status: Widowed  Human resources officer Violence: Not At Risk   Fear of Current or Ex-Partner: No   Emotionally Abused: No   Physically Abused: No   Sexually Abused: No    Review of Systems: See HPI, otherwise negative ROS  Physical Exam: BP 131/83   Pulse 90   Temp (!) 96.8 F (36 C) (Temporal)   Resp 18   Ht '5\' 3"'$  (1.6 m)   Wt 84.8 kg   SpO2 98%   BMI 33.13 kg/m  General:   Alert,  pleasant and cooperative in NAD Head:  Normocephalic and atraumatic. Neck:  Supple; no masses or thyromegaly. Lungs:  Clear throughout to auscultation.    Heart:  Regular rate and rhythm. Abdomen:  Soft, nontender and nondistended. Normal bowel sounds, without guarding, and without rebound.   Neurologic:  Alert and  oriented x4;  grossly normal neurologically.  Impression/Plan: [name] is here for an endoscopy to be performed for upper endoscopy and bravo pH study  Risks, benefits, limitations, and alternatives regarding  endoscopy have been reviewed with the patient.  Questions have been answered.  All parties agreeable.   Jennifer Lame, MD  02/02/2022, 7:32 AM

## 2022-02-02 NOTE — Transfer of Care (Signed)
Immediate Anesthesia Transfer of Care Note  Patient: Jennifer Patrick  Procedure(s) Performed: BRAVO PH STUDY ESOPHAGOGASTRODUODENOSCOPY (EGD)  Patient Location: Endoscopy Unit  Anesthesia Type:General  Level of Consciousness: drowsy  Airway & Oxygen Therapy: Patient Spontanous Breathing  Post-op Assessment: Report given to RN and Post -op Vital signs reviewed and stable  Post vital signs: Reviewed  Last Vitals:  Vitals Value Taken Time  BP 112/61 02/02/22 0815  Temp    Pulse 88 02/02/22 0814  Resp 16 02/02/22 0814  SpO2 95 % 02/02/22 0814  Vitals shown include unvalidated device data.  Last Pain:  Vitals:   02/02/22 0658  TempSrc: Temporal         Complications: No notable events documented.

## 2022-02-02 NOTE — Anesthesia Preprocedure Evaluation (Signed)
Anesthesia Evaluation  Patient identified by MRN, date of birth, ID band Patient awake    Reviewed: Allergy & Precautions, H&P , NPO status , Patient's Chart, lab work & pertinent test results, reviewed documented beta blocker date and time   History of Anesthesia Complications Negative for: history of anesthetic complications  Airway Mallampati: II  TM Distance: >3 FB Neck ROM: full    Dental  (+) Dental Advidsory Given, Caps, Missing, Teeth Intact   Pulmonary neg shortness of breath, sleep apnea , neg COPD, neg recent URI,    Pulmonary exam normal breath sounds clear to auscultation       Cardiovascular Exercise Tolerance: Good hypertension, (-) angina(-) Past MI and (-) Cardiac Stents negative cardio ROS Normal cardiovascular exam(-) dysrhythmias (-) Valvular Problems/Murmurs Rhythm:regular Rate:Normal     Neuro/Psych PSYCHIATRIC DISORDERS Anxiety Depression negative neurological ROS     GI/Hepatic Neg liver ROS, GERD  ,  Endo/Other  neg diabetesHypothyroidism   Renal/GU negative Renal ROS  negative genitourinary   Musculoskeletal   Abdominal   Peds  Hematology negative hematology ROS (+)   Anesthesia Other Findings Past Medical History: No date: Anxiety No date: Arthritis No date: Cataract No date: GERD (gastroesophageal reflux disease) No date: Hypertension No date: Hypothyroidism No date: Psoriasis     Comment:  Dr. Nehemiah Massed, on embrel No date: Sleep apnea No date: Wears partial dentures     Comment:  upper   Reproductive/Obstetrics negative OB ROS                             Anesthesia Physical Anesthesia Plan  ASA: 3  Anesthesia Plan: General   Post-op Pain Management:    Induction: Intravenous  PONV Risk Score and Plan: 3 and Propofol infusion and TIVA  Airway Management Planned: Natural Airway and Nasal Cannula  Additional Equipment:   Intra-op Plan:    Post-operative Plan:   Informed Consent: I have reviewed the patients History and Physical, chart, labs and discussed the procedure including the risks, benefits and alternatives for the proposed anesthesia with the patient or authorized representative who has indicated his/her understanding and acceptance.     Dental Advisory Given  Plan Discussed with: Anesthesiologist, CRNA and Surgeon  Anesthesia Plan Comments:         Anesthesia Quick Evaluation

## 2022-02-03 ENCOUNTER — Encounter: Payer: Self-pay | Admitting: Gastroenterology

## 2022-02-03 NOTE — Anesthesia Postprocedure Evaluation (Signed)
Anesthesia Post Note  Patient: Jennifer Patrick  Procedure(s) Performed: BRAVO PH STUDY ESOPHAGOGASTRODUODENOSCOPY (EGD)  Patient location during evaluation: Endoscopy Anesthesia Type: General Level of consciousness: awake and alert Pain management: pain level controlled Vital Signs Assessment: post-procedure vital signs reviewed and stable Respiratory status: spontaneous breathing, nonlabored ventilation, respiratory function stable and patient connected to nasal cannula oxygen Cardiovascular status: blood pressure returned to baseline and stable Postop Assessment: no apparent nausea or vomiting Anesthetic complications: no   No notable events documented.   Last Vitals:  Vitals:   02/02/22 0824 02/02/22 0834  BP: 131/76 140/80  Pulse:    Resp:    Temp:    SpO2:      Last Pain:  Vitals:   02/03/22 0730  TempSrc:   PainSc: 0-No pain                 Martha Clan

## 2022-02-06 NOTE — Op Note (Addendum)
[facility] Gastroenterology Patient Name: Jennifer Patrick Procedure Date: 02/02/2022 7:30 AM MRN: [id number] Account #: [id number] Date of Birth: 06-28-1951 Admit Type: Outpatient Age: 71 Room: [facility] ENDO ROOM 4 Gender: Female Note Status: Finalized Instrument Name: Michaelle Birks [id number] Procedure:             Upper GI endoscopy Indications:           Heartburn Providers:             [name] MD, MD Referring MD:          Yvetta Coder. Arnett (Referring MD) Medicines:             Propofol per Anesthesia Complications:         No immediate complications. Procedure:             Pre-Anesthesia Assessment:                        - Prior to the procedure, a History and Physical was                         performed, and patient medications and allergies were                         reviewed. The patient's tolerance of previous                         anesthesia was also reviewed. The risks and benefits                         of the procedure and the sedation options and risks                         were discussed with the patient. All questions were                         answered, and informed consent was obtained. Prior                         Anticoagulants: The patient has taken no previous                         anticoagulant or antiplatelet agents. ASA Grade                         Assessment: II - A patient with mild systemic disease.                         After reviewing the risks and benefits, the patient                         was deemed in satisfactory condition to undergo the                         procedure.                        After obtaining informed consent, the endoscope was  passed under direct vision. Throughout the procedure,                         the patient's blood pressure, pulse, and oxygen                         saturations were monitored continuously. The Endoscope                         was  introduced through the mouth, and advanced to the                         second part of duodenum. The upper GI endoscopy was                         accomplished without difficulty. The patient tolerated                         the procedure well. Findings:      A medium-sized hiatal hernia was present. The BRAVO capsule with       delivery system was introduced through the mouth and advanced into the       esophagus, such that the BRAVO pH capsule was positioned 29 cm from the       incisors, which was 6 cm proximal to the GE junction. The BRAVO pH       capsule was then deployed and attached to the esophageal mucosa. The       delivery system was then withdrawn. Endoscopy was utilized for placement       of the probe only.      One benign-appearing, intrinsic mild stenosis was found at the       gastroesophageal junction. The stenosis was traversed. A TTS dilator was       passed through the scope. Dilation with a 15-16.5-18 mm balloon dilator       was performed to 18 mm.      The stomach was normal.      The examined duodenum was normal. Impression:            - Medium-sized hiatal hernia.                        - Benign-appearing esophageal stenosis. Dilated.                        - Normal stomach.                        - Normal examined duodenum.                        - The BRAVO pH capsule was deployed.                        - No specimens collected. Recommendation:        - Discharge patient to home.                        - Resume previous diet.                        -  Continue present medications. Procedure Code(s):     --- Professional ---                        [phone], Esophagogastroduodenoscopy, flexible,                         transoral; with transendoscopic balloon dilation of                         esophagus (less than 30 mm diameter) CPT copyright 2019 American Medical Association. All rights reserved. The codes documented in this report are preliminary and  upon coder review may  be revised to meet current compliance requirements. [name] MD, MD 02/02/2022 8:14:56 AM This report has been signed electronically. Number of Addenda: 0 Note Initiated On: 02/02/2022 7:30 AM Estimated Blood Loss:  Estimated blood loss: none.      [facility]

## 2022-02-14 ENCOUNTER — Telehealth: Payer: Self-pay | Admitting: Gastroenterology

## 2022-02-14 NOTE — Telephone Encounter (Signed)
PT left message to see if she needs follow-up appointment. Would like a call back.

## 2022-02-15 NOTE — Telephone Encounter (Signed)
Pt is wanting results from the Bravo pH study and if there is anything further needed, f/u etc.... Please advise

## 2022-02-17 DIAGNOSIS — M79675 Pain in left toe(s): Secondary | ICD-10-CM | POA: Diagnosis not present

## 2022-02-17 DIAGNOSIS — E669 Obesity, unspecified: Secondary | ICD-10-CM | POA: Diagnosis not present

## 2022-02-17 DIAGNOSIS — Z6833 Body mass index (BMI) 33.0-33.9, adult: Secondary | ICD-10-CM | POA: Diagnosis not present

## 2022-02-17 DIAGNOSIS — L4050 Arthropathic psoriasis, unspecified: Secondary | ICD-10-CM | POA: Diagnosis not present

## 2022-02-17 DIAGNOSIS — L401 Generalized pustular psoriasis: Secondary | ICD-10-CM | POA: Diagnosis not present

## 2022-02-17 DIAGNOSIS — R52 Pain, unspecified: Secondary | ICD-10-CM | POA: Diagnosis not present

## 2022-02-17 DIAGNOSIS — R7989 Other specified abnormal findings of blood chemistry: Secondary | ICD-10-CM | POA: Diagnosis not present

## 2022-02-21 NOTE — Telephone Encounter (Signed)
Left message on voicemail for pt to r/t my call to schedule a f/u appt

## 2022-02-22 DIAGNOSIS — Z79899 Other long term (current) drug therapy: Secondary | ICD-10-CM | POA: Diagnosis not present

## 2022-02-22 DIAGNOSIS — L4050 Arthropathic psoriasis, unspecified: Secondary | ICD-10-CM | POA: Diagnosis not present

## 2022-02-22 NOTE — Telephone Encounter (Signed)
Appt scheduled for June 21st for f/u

## 2022-03-02 DIAGNOSIS — M79674 Pain in right toe(s): Secondary | ICD-10-CM | POA: Diagnosis not present

## 2022-03-02 DIAGNOSIS — I10 Essential (primary) hypertension: Secondary | ICD-10-CM | POA: Diagnosis not present

## 2022-03-02 DIAGNOSIS — Z79890 Hormone replacement therapy (postmenopausal): Secondary | ICD-10-CM | POA: Diagnosis not present

## 2022-03-02 DIAGNOSIS — E039 Hypothyroidism, unspecified: Secondary | ICD-10-CM | POA: Diagnosis not present

## 2022-03-08 ENCOUNTER — Ambulatory Visit (INDEPENDENT_AMBULATORY_CARE_PROVIDER_SITE_OTHER): Payer: Medicare Other | Admitting: Gastroenterology

## 2022-03-08 ENCOUNTER — Encounter: Payer: Self-pay | Admitting: Gastroenterology

## 2022-03-08 VITALS — BP 109/71 | HR 82 | Temp 98.6°F | Wt 188.0 lb

## 2022-03-08 DIAGNOSIS — K219 Gastro-esophageal reflux disease without esophagitis: Secondary | ICD-10-CM

## 2022-03-08 NOTE — Progress Notes (Signed)
Primary Care Physician: Burnard Hawthorne, FNP  Primary Gastroenterologist:  Dr. [name]  Chief Complaint  Patient presents with   Follow-up    Bravo pH study    HPI: Jennifer Patrick is a 71 y.o. female here for follow-up after having been seen for acid reflux unresponsive to omeprazole and pantoprazole.  The patient underwent a upper endoscopy with a Bravo pH study.  The 2-day DeMeester score was elevated at 19.  Normal is less than 14.  The breakdown of the scores showed that the first day of pH monitoring showed a DeMeester score of 33 with the second day being 1.1.  The first day the patient had 27 episodes of reflux with 6 of them lasting a long period of time while on the second day the patient only had 5 episodes of reflux recorded and none of them lasted very long. The patient reports that she had not taken her PPI on the first day of the pH study.  Past Medical History:  Diagnosis Date   Anxiety    Arthritis    Cataract    GERD (gastroesophageal reflux disease)    Hypertension    Hypothyroidism    Psoriasis    Dr. Nehemiah Massed, on embrel   Sleep apnea    Wears partial dentures    upper    Current Outpatient Medications  Medication Sig Dispense Refill   atenolol (TENORMIN) 25 MG tablet Take by mouth daily.     dexlansoprazole (DEXILANT) 60 MG capsule Take 1 capsule (60 mg total) by mouth daily. 30 capsule 11   folic acid (FOLVITE) 1 MG tablet Take 1 mg by mouth daily.     Golimumab (SIMPONI ARIA IV)      levothyroxine (SYNTHROID) 125 MCG tablet TAKE 1 TABLET BY MOUTH EVERY DAY 90 tablet 0   methotrexate (RHEUMATREX) 2.5 MG tablet Take 1 tablet by mouth.     Multiple Vitamins-Minerals (PRESERVISION AREDS 2 PO) Take by mouth daily.     omeprazole (PRILOSEC) 40 MG capsule Take 40 mg by mouth daily.     sertraline (ZOLOFT) 50 MG tablet TAKE 1 TABLET BY MOUTH EVERY DAY 90 tablet 0   triamterene-hydrochlorothiazide (MAXZIDE-25) 37.5-25 MG tablet TAKE 1 TABLET BY  MOUTH EVERY DAY 90 tablet 0   No current facility-administered medications for this visit.    Allergies as of 03/08/2022 - Review Complete 02/02/2022  Allergen Reaction Noted   Penicillins  07/03/2011    ROS:  General: Negative for anorexia, weight loss, fever, chills, fatigue, weakness. ENT: Negative for hoarseness, difficulty swallowing , nasal congestion. CV: Negative for chest pain, angina, palpitations, dyspnea on exertion, peripheral edema.  Respiratory: Negative for dyspnea at rest, dyspnea on exertion, cough, sputum, wheezing.  GI: See history of present illness. GU:  Negative for dysuria, hematuria, urinary incontinence, urinary frequency, nocturnal urination.  Endo: Negative for unusual weight change.    Physical Examination:   There were no vitals taken for this visit.  General: Well-nourished, well-developed in no acute distress.  Eyes: No icterus. Conjunctivae pink. Neuro: Alert and oriented x 3.  Grossly intact. Skin: no jaundice.   Psych: Alert and cooperative, normal mood and affect.  Labs:    Imaging Studies: No results found.  Assessment and Plan:   Jennifer Patrick is a 71 y.o. y/o female who comes in today with for a review of the pH study.  The patient had a very high DeMeester score with significant acid on the day  that the probe was placed consistent with her not taking her medication.  The patient had a DeMeester score of 1.1 on day 2 with acid well controlled and no pathological reflux at that time. The patient's main symptoms are coughing when she lays down with reflux of bitter material likely to be bile.  The pH probe study has shown that she definitely does have reflux with the high acid on the first day but the second a shows that the medication is working well to decrease the acid in her esophagus.  The patient has been told that the reflux of non-acidic material causing most of her symptoms can be treated with antireflux surgery.  The patient  states that she would like to hold off on any surgical intervention at this time and will contact me if she changes her mind.  The patient will stay on her PPI.  The patient has been explained the plan and agrees with it.     [name], MD. [name]    Note: This dictation was prepared with Dragon dictation along with smaller phrase technology. Any transcriptional errors that result from this process are unintentional.

## 2022-03-25 ENCOUNTER — Other Ambulatory Visit: Payer: Self-pay | Admitting: Family

## 2022-04-19 DIAGNOSIS — L4050 Arthropathic psoriasis, unspecified: Secondary | ICD-10-CM | POA: Diagnosis not present

## 2022-04-19 DIAGNOSIS — Z79899 Other long term (current) drug therapy: Secondary | ICD-10-CM | POA: Diagnosis not present

## 2022-04-22 ENCOUNTER — Other Ambulatory Visit: Payer: Self-pay | Admitting: Family

## 2022-04-26 ENCOUNTER — Other Ambulatory Visit: Payer: Self-pay | Admitting: Family

## 2022-04-26 ENCOUNTER — Encounter: Payer: Self-pay | Admitting: Family

## 2022-04-26 ENCOUNTER — Ambulatory Visit (INDEPENDENT_AMBULATORY_CARE_PROVIDER_SITE_OTHER): Payer: Medicare Other | Admitting: Family

## 2022-04-26 VITALS — BP 134/80 | HR 92 | Temp 97.8°F | Ht 63.0 in | Wt 190.2 lb

## 2022-04-26 DIAGNOSIS — E785 Hyperlipidemia, unspecified: Secondary | ICD-10-CM

## 2022-04-26 DIAGNOSIS — R7309 Other abnormal glucose: Secondary | ICD-10-CM | POA: Diagnosis not present

## 2022-04-26 DIAGNOSIS — I10 Essential (primary) hypertension: Secondary | ICD-10-CM | POA: Diagnosis not present

## 2022-04-26 DIAGNOSIS — K219 Gastro-esophageal reflux disease without esophagitis: Secondary | ICD-10-CM

## 2022-04-26 DIAGNOSIS — M79674 Pain in right toe(s): Secondary | ICD-10-CM | POA: Diagnosis not present

## 2022-04-26 DIAGNOSIS — E782 Mixed hyperlipidemia: Secondary | ICD-10-CM | POA: Insufficient documentation

## 2022-04-26 LAB — HEMOGLOBIN A1C: Hgb A1c MFr Bld: 6.1 % (ref 4.6–6.5)

## 2022-04-26 LAB — URIC ACID: Uric Acid, Serum: 7 mg/dL (ref 2.4–7.0)

## 2022-04-26 MED ORDER — COLCHICINE 0.6 MG PO TABS: ORAL_TABLET | ORAL | 1 refills | Status: DC

## 2022-04-26 MED ORDER — OMEPRAZOLE 40 MG PO CPDR: 40.0000 mg | DELAYED_RELEASE_CAPSULE | Freq: Every day | ORAL | 1 refills | Status: DC

## 2022-04-26 MED ORDER — AMLODIPINE BESYLATE 5 MG PO TABS: 5.0000 mg | ORAL_TABLET | Freq: Every day | ORAL | 1 refills | Status: DC

## 2022-04-26 NOTE — Patient Instructions (Addendum)
I placed referral to cardiology to discuss CT calcium score with cardiology to stratify /assess risk of coronary artery disease.  Let us know if you dont hear back within a week in regards to an appointment being scheduled.   Stop hydrochlorothiazide as concern as this is contributing to gout flare.  In its place, I sent in amlodipine 5 mg.  Please continue atenolol.      Gout Gout is an inflammatory arthritis caused by a buildup of uric acid crystals in the joints. Uric acid is a chemical that is normally present in the blood. When the level of uric acid in the blood is too high it can form crystals that deposit in your joints and tissues. This causes joint redness, soreness, and swelling (inflammation). Repeat attacks are common. Over time, uric acid crystals can form into masses (tophi) near a joint, destroying bone and causing disfigurement. Gout is treatable and often preventable. CAUSES   The disease begins with elevated levels of uric acid in the blood. Uric acid is produced by your body when it breaks down a naturally found substance called purines. Certain foods you eat, such as meats and fish, contain high amounts of purines. Causes of an elevated uric acid level include: Being passed down from parent to child (heredity). Diseases that cause increased uric acid production (such as obesity, psoriasis, and certain cancers). Excessive alcohol use. Diet, especially diets rich in meat and seafood. Medicines, including certain cancer-fighting medicines (chemotherapy), water pills (diuretics), and aspirin. Chronic kidney disease. The kidneys are no longer able to remove uric acid well. Problems with metabolism. Conditions strongly associated with gout include: Obesity. High blood pressure. High cholesterol. Diabetes. Not everyone with elevated uric acid levels gets gout. It is not understood why some people get gout and others do not. Surgery, joint injury, and eating too much of certain  foods are some of the factors that can lead to gout attacks. SYMPTOMS   An attack of gout comes on quickly. It causes intense pain with redness, swelling, and warmth in a joint. Fever can occur. Often, only one joint is involved. Certain joints are more commonly involved: Base of the big toe. Knee. Ankle. Wrist. Finger. Without treatment, an attack usually goes away in a few days to weeks. Between attacks, you usually will not have symptoms, which is different from many other forms of arthritis. DIAGNOSIS   Your caregiver will suspect gout based on your symptoms and exam. In some cases, tests may be recommended. The tests may include: Blood tests. Urine tests. X-rays. Joint fluid exam. This exam requires a needle to remove fluid from the joint (arthrocentesis). Using a microscope, gout is confirmed when uric acid crystals are seen in the joint fluid. TREATMENT   There are two phases to gout treatment: treating the sudden onset (acute) attack and preventing attacks (prophylaxis). Treatment of an Acute Attack. Medicines are used. These include anti-inflammatory medicines or steroid medicines. An injection of steroid medicine into the affected joint is sometimes necessary. The painful joint is rested. Movement can worsen the arthritis. You may use warm or cold treatments on painful joints, depending which works best for you. Treatment to Prevent Attacks. If you suffer from frequent gout attacks, your caregiver may advise preventive medicine. These medicines are started after the acute attack subsides. These medicines either help your kidneys eliminate uric acid from your body or decrease your uric acid production. You may need to stay on these medicines for a very long time. The early  phase of treatment with preventive medicine can be associated with an increase in acute gout attacks. For this reason, during the first few months of treatment, your caregiver may also advise you to take  medicines usually used for acute gout treatment. Be sure you understand your caregiver's directions. Your caregiver may make several adjustments to your medicine dose before these medicines are effective. Discuss dietary treatment with your caregiver or dietitian. Alcohol and drinks high in sugar and fructose and foods such as meat, poultry, and seafood can increase uric acid levels. Your caregiver or dietitian can advise you on drinks and foods that should be limited. HOME CARE INSTRUCTIONS   Do not take aspirin to relieve pain. This raises uric acid levels. Only take over-the-counter or prescription medicines for pain, discomfort, or fever as directed by your caregiver. Rest the joint as much as possible. When in bed, keep sheets and blankets off painful areas. Keep the affected joint raised (elevated). Apply warm or cold treatments to painful joints. Use of warm or cold treatments depends on which works best for you. Use crutches if the painful joint is in your leg. Drink enough fluids to keep your urine clear or pale yellow. This helps your body get rid of uric acid. Limit alcohol, sugary drinks, and fructose drinks. Follow your dietary instructions. Pay careful attention to the amount of protein you eat. Your daily diet should emphasize fruits, vegetables, whole grains, and fat-free or low-fat milk products. Discuss the use of coffee, vitamin C, and cherries with your caregiver or dietitian. These may be helpful in lowering uric acid levels. Maintain a healthy body weight. SEEK MEDICAL CARE IF:   You develop diarrhea, vomiting, or any side effects from medicines. You do not feel better in 24 hours, or you are getting worse. SEEK IMMEDIATE MEDICAL CARE IF:   Your joint becomes suddenly more tender, and you have chills or a fever. MAKE SURE YOU:   Understand these instructions. Will watch your condition. Will get help right away if you are not doing well or get worse.   This information is  not intended to replace advice given to you by your health care provider. Make sure you discuss any questions you have with your health care provider.   Document Released: 09/01/2000 Document Revised: 09/25/2014 Document Reviewed: 04/17/2012 Elsevier Interactive Patient Education 2016 [employer].   Gout diet: What's allowed, what's not By [facility] Staff  Definition Gout, a painful form of arthritis, occurs when high levels of uric acid in the blood cause crystals to form and accumulate around a joint. Uric acid is produced when the body breaks down a chemical called purine. Purine occurs naturally in your body, but it's also found in certain foods. Uric acid is eliminated from the body in urine. A gout diet may help decrease uric acid levels in the blood. While a gout diet is not a cure, it may lower the risk of recurring painful gout attacks and slow the progression of joint damage. Medication also is needed to manage pain and to lower levels of uric acid. Purpose A little history Gout has been associated for centuries with overindulgence in meats, seafood and alcohol. The condition was, in fact, considered a disease of the wealthiest people -- those who could afford such eating habits. And long before the cause of gout was understood, doctors had observed some benefit of a restricted diet on gout management. For many years, treatment for gout focused on eliminating all foods that had  moderate to high amounts of purine. The list of foods to avoid was long, which made the diet difficult to follow. Current understanding More recent research on gout has created a clearer picture of the role of diet in disease management. Some foods should be avoided, but not all foods with purines should be eliminated. And some foods should be included in your diet to control uric acid levels. The purpose of a gout diet today is to address all factors related to disease risk and management. Above all, the goals  are a healthy weight and healthy eating -- a message that applies to lowering the risk of many diseases. Diet details The general principles of a gout diet are essentially the same as recommendations for a balanced, healthy diet: Weight loss. Being overweight increases the risk of developing gout, and losing weight lowers the risk of gout. Research suggests that reducing the number of calories and losing weight -- even without a purine-restricted diet -- lowers uric acid levels and reduces the number of gout attacks. Losing weight also lessens the overall stress on joints.   Complex carbs. Eat more fruits, vegetables and whole grains, which provide complex carbohydrates. Avoid foods such as white bread, cakes, candy, sugar-sweetened beverages and products with high-fructose corn syrup.   Water. Keep yourself hydrated by drinking water. An increase in water consumption has been linked to fewer gout attacks. Aim for eight to 16 glasses of fluids a day with at least half of that as water. A glass is 8 ounces (237 milliliters). Talk to your doctor about appropriate fluid intake goals for you.   Fats. Cut back on saturated fats from red meats, fatty poultry and high-fat dairy products.   Proteins. Limit daily proteins from lean meat, fish and poultry to 4 to 6 ounces (113 to 170 grams). Add protein to your diet with low-fat or fat-free dairy products, such as low-fat yogurt or skim milk, which are associated with reduced uric acid levels. Recommendations for specific foods or supplements include the following: High-purine vegetables. Studies have shown that vegetables high in purines do not increase the risk of gout or recurring gout attacks. A healthy diet based on lots of fruits and vegetables can include high-purine vegetables, such as asparagus, spinach, peas, cauliflower or mushrooms. You can also eat beans or lentils, which are moderately high in purines but are also a good source of protein.   Organ and  glandular meats. Avoid meats such as liver, kidney and sweetbreads, which have high purine levels and contribute to high blood levels of uric acid.   Selected seafood. Avoid the following types of seafood, which are higher in purines than others: anchovies, herring, sardines, mussels, scallops, trout, haddock, mackerel and tuna.   Alcohol. The metabolism of alcohol in your body is thought to increase uric acid production, and alcohol contributes to dehydration. Beer is associated with an increased risk of gout and recurring attacks, as are distilled liquors to some extent. The effect of wine is not as well-understood. If you drink alcohol, talk to your doctor about what is appropriate for you.   Vitamin C. Vitamin C may help lower uric acid levels. Talk to your doctor about whether a 500-milligram vitamin C supplement fits into your diet and medication plan.   Coffee. Some research suggests that moderate coffee consumption may be associated with a reduced risk of gout, particularly with regular caffeinated coffee. Drinking coffee may not be appropriate for other medical conditions. Talk to your doctor about  how much coffee is right for you.   Cherries. There is some evidence that eating cherries is associated with a reduced risk of gout attacks. A sample menu Here's a look at what you might eat during a typical day on a gout diet: Breakfast Whole-grain, unsweetened cereal with skim or low-fat milk   1 cup fresh strawberries   Coffee   Water Lunch Roasted chicken breast slices (2 ounces) on a whole-grain roll with mustard   Mixed green salad with balsamic vinegar and olive oil dressing   Skim or low-fat milk   Water Afternoon snack 1 cup fresh cherries   Water Dinner Roasted salmon (3-4 ounces)   Roasted or steamed green beans   1/2 cup whole-grain pasta with olive oil and lemon pepper   Water   Low-fat yogurt   1 cup fresh melon   Caffeine-free beverage, such as herbal tea

## 2022-04-26 NOTE — Assessment & Plan Note (Signed)
Presentation c/w gout. Pending uric acid, cbc Discussed HCTZ may be exacerbating. Stop hctz.  Start colchicine. May opt to continue colchicine for prophylaxis if gout is persistent after discontinuation of hctz.

## 2022-04-26 NOTE — Assessment & Plan Note (Signed)
The 10-year ASCVD risk score ( DK, et al., 2019) is: 18.9% Discussed ASCVD risk.Calfificaiton seen CAD in 2018. We agreed that further evaluation and risk stratification with cardiology including discussion of CT calcium score is a very appropriate next step.  Referral has been placed

## 2022-04-26 NOTE — Assessment & Plan Note (Signed)
Stop HCTZ due to suspected recurrent gout flares . Start amlodopine '5mg'$ . Continue atenolol '25mg'$ 

## 2022-04-26 NOTE — Progress Notes (Signed)
Subjective:    Patient ID: Jennifer Patrick, female    DOB: 08/09/1951, 71 y.o.   MRN: [id number]  CC: Jennifer Patrick is a 71 y.o. female who presents today for an acute visit.    HPI: Complains of right great toe pain x one week, waxing and waning.   Pain improves with ibuprofen.  Area is slightly red and warm.  No fever, wound, swelling.    [place]. She was treated with indomethacin and prednisone 2 months ago with resolution.   Hypertension-compliant with triamterene hydrochlorothiazide 37.5-25, atenolol '25mg'$   Occasional alcohol use  H/o RA  No DM, CKD  She has been on prednisone several months ago from her rhe No known family history ASCVD. HISTORY:  Past Medical History:  Diagnosis Date   Anxiety    Arthritis    Cataract    GERD (gastroesophageal reflux disease)    Hypertension    Hypothyroidism    Psoriasis    Dr. Nehemiah Massed, on embrel   Sleep apnea    Wears partial dentures    upper   Past Surgical History:  Procedure Laterality Date   ABDOMINAL HYSTERECTOMY  1999   for abnormal pap, TVH. No ovaries or cervix.    BRAVO PH STUDY N/A 02/02/2022   Procedure: BRAVO [place];  Surgeon: [name], MD;  Location: [facility] ENDOSCOPY;  Service: Endoscopy;  Laterality: N/A;   BREAST EXCISIONAL BIOPSY Left 1981   benign   CATARACT EXTRACTION Bilateral    COLONOSCOPY WITH PROPOFOL N/A 12/27/2020   Procedure: COLONOSCOPY WITH PROPOFOL;  Surgeon: [name], MD;  Location: [facility];  Service: Endoscopy;  Laterality: N/A;   ESOPHAGOGASTRODUODENOSCOPY N/A 02/02/2022   Procedure: ESOPHAGOGASTRODUODENOSCOPY (EGD);  Surgeon: [name], MD;  Location[facility] ENDOSCOPY;  Service: Endoscopy;  Laterality: N/A;   THYROIDECTOMY  1976   VITRECTOMY Right 2014   Family History  Problem Relation Age of Onset   Breast cancer Mother 20   Lung cancer Mother    Macular degeneration Mother    Sudden  death Father 63       committed suicide   Diabetes Father    Lung disease Sister        legionaires   Hepatitis C Brother    Colon cancer Neg Hx     Allergies: Penicillins Current Outpatient Medications on File Prior to Visit  Medication Sig Dispense Refill   atenolol (TENORMIN) 25 MG tablet Take by mouth daily.     folic acid (FOLVITE) 1 MG tablet Take 1 mg by mouth daily.     Golimumab (SIMPONI ARIA IV)      levothyroxine (SYNTHROID) 125 MCG tablet TAKE 1 TABLET BY MOUTH EVERY DAY 30 tablet 1   methotrexate (RHEUMATREX) 2.5 MG tablet Take 1 tablet by mouth.     Multiple Vitamins-Minerals (PRESERVISION AREDS 2 PO) Take by mouth daily.     sertraline (ZOLOFT) 50 MG tablet TAKE 1 TABLET BY MOUTH EVERY DAY 30 tablet 1   triamterene-hydrochlorothiazide (MAXZIDE-25) 37.5-25 MG tablet TAKE 1 TABLET BY MOUTH EVERY DAY 30 tablet 1   No current facility-administered medications on file prior to visit.    Social History   Tobacco Use   Smoking status: Never   Smokeless tobacco: Never  Vaping Use   Vaping Use: Never used  Substance Use Topics   Alcohol use: Yes    Comment: socially   Drug use: No  Review of Systems  Constitutional:  Negative for chills and fever.  Respiratory:  Negative for cough.   Cardiovascular:  Negative for chest pain and palpitations.  Gastrointestinal:  Negative for nausea and vomiting.  Musculoskeletal:  Positive for arthralgias.      Objective:    BP 134/80 (BP Location: Left Arm, Patient Position: Sitting, Cuff Size: Large)   Pulse 92   Temp 97.8 F (36.6 C) (Oral)   Ht '5\' 3"'$  (1.6 m)   Wt 190 lb 3.2 oz (86.3 kg)   SpO2 97%   BMI 33.69 kg/m    Physical Exam Vitals reviewed.  Constitutional:      Appearance: She is well-developed.  Eyes:     Conjunctiva/sclera: Conjunctivae normal.  Cardiovascular:     Rate and Rhythm: Normal rate and regular rhythm.     Pulses: Normal pulses.     Heart sounds: Normal heart sounds.  Pulmonary:      Effort: Pulmonary effort is normal.     Breath sounds: Normal breath sounds. No wheezing, rhonchi or rales.  Musculoskeletal:       Legs:     Comments: Intense pain at base dorsal surface right MTP. Skin intact. Area is not boggy. No red streaks  Skin:    General: Skin is warm and dry.  Neurological:     Mental Status: She is alert.  Psychiatric:        Speech: Speech normal.        Behavior: Behavior normal.        Thought Content: Thought content normal.        Assessment & Plan:   Problem List Items Addressed This Visit       Cardiovascular and Mediastinum   Hypertension    Stop HCTZ due to suspected recurrent gout flares . Start amlodopine '5mg'$ . Continue atenolol '25mg'$        Relevant Medications   amLODipine (NORVASC) 5 MG tablet     Digestive   GERD (gastroesophageal reflux disease)   Relevant Medications   omeprazole (PRILOSEC) 40 MG capsule     Other   Great toe pain, right - Primary    Presentation c/w gout. Pending uric acid, cbc Discussed HCTZ may be exacerbating. Stop hctz.  Start colchicine. May opt to continue colchicine for prophylaxis if gout is persistent after discontinuation of hctz.       Relevant Medications   colchicine 0.6 MG tablet   Other Relevant Orders   Uric acid   Hemoglobin A1c   HLD (hyperlipidemia)    The 10-year ASCVD risk score ( DK, et al., 2019) is: 18.9% Discussed ASCVD risk.Calfificaiton seen CAD in 2018. We agreed that further evaluation and risk stratification with cardiology including discussion of CT calcium score is a very appropriate next step.  Referral has been placed      Relevant Medications   amLODipine (NORVASC) 5 MG tablet   Other Relevant Orders   Ambulatory referral to Cardiology   Other Visit Diagnoses     Elevated glucose       Relevant Orders   Hemoglobin A1c          I have changed [name]. [place] "[name]"'s omeprazole. I am also having her start on amLODipine and colchicine.  Additionally, I am having her maintain her Golimumab (Henry ARIA IV), Multiple Vitamins-Minerals (PRESERVISION AREDS 2 PO), folic acid, methotrexate, atenolol, levothyroxine, sertraline, and triamterene-hydrochlorothiazide.   Meds ordered this encounter  Medications   omeprazole (PRILOSEC) 40 MG capsule  Sig: Take 1 capsule (40 mg total) by mouth daily.    Dispense:  90 capsule    Refill:  1   amLODipine (NORVASC) 5 MG tablet    Sig: Take 1 tablet (5 mg total) by mouth daily.    Dispense:  90 tablet    Refill:  1    Order Specific Question:   Supervising Provider    Answer:   Deborra Medina L [[id number]]   colchicine 0.6 MG tablet    Sig: Take 1.2 mg PO, one hour later by another 0.6 mg on first day; then 0.6 mg twice daily until attack resolved.    Dispense:  30 tablet    Refill:  1    Order Specific Question:   Supervising Provider    Answer:   Crecencio Mc [[id number]]    Return precautions given.   Risks, benefits, and alternatives of the medications and treatment plan prescribed today were discussed, and patient expressed understanding.   Education regarding symptom management and diagnosis given to patient on AVS.  Continue to follow with Burnard Hawthorne, FNP for routine health maintenance.   [name] and I agreed with plan.   Mable Paris, FNP

## 2022-05-03 ENCOUNTER — Other Ambulatory Visit: Payer: Self-pay | Admitting: Family

## 2022-05-03 DIAGNOSIS — M79674 Pain in right toe(s): Secondary | ICD-10-CM

## 2022-05-10 DIAGNOSIS — H353131 Nonexudative age-related macular degeneration, bilateral, early dry stage: Secondary | ICD-10-CM | POA: Diagnosis not present

## 2022-05-11 ENCOUNTER — Telehealth: Payer: Self-pay | Admitting: *Deleted

## 2022-05-11 NOTE — Patient Outreach (Signed)
  Care Coordination   Initial Visit Note   05/11/2022 Name: Jennifer Patrick MRN: [id number] DOB: 1950-12-22  Jennifer Patrick is a 71 y.o. year old female who sees Jennifer Patrick, Jennifer Coder, FNP for primary care. I spoke with  Jennifer Patrick by phone today RN discussed services [facility] services, RN, SW, and Pharmacist. Patient declined services.    Goals Addressed   None     SDOH assessments and interventions completed:  No     Care Coordination Interventions Activated:  No  Care Coordination Interventions:  No, not indicated   Follow up plan: No further intervention required.   Encounter Outcome:  Pt. Visit Completed   [place] [facility] [phone]

## 2022-05-15 ENCOUNTER — Other Ambulatory Visit: Payer: Self-pay | Admitting: Family

## 2022-05-15 DIAGNOSIS — M79674 Pain in right toe(s): Secondary | ICD-10-CM

## 2022-05-18 ENCOUNTER — Other Ambulatory Visit: Payer: Self-pay | Admitting: Family

## 2022-05-23 DIAGNOSIS — H43813 Vitreous degeneration, bilateral: Secondary | ICD-10-CM | POA: Diagnosis not present

## 2022-05-23 DIAGNOSIS — H542X21 Low vision right eye category 2, low vision left eye category 1: Secondary | ICD-10-CM | POA: Diagnosis not present

## 2022-05-23 DIAGNOSIS — H353134 Nonexudative age-related macular degeneration, bilateral, advanced atrophic with subfoveal involvement: Secondary | ICD-10-CM | POA: Diagnosis not present

## 2022-05-23 DIAGNOSIS — H35033 Hypertensive retinopathy, bilateral: Secondary | ICD-10-CM | POA: Diagnosis not present

## 2022-05-28 ENCOUNTER — Other Ambulatory Visit: Payer: Self-pay | Admitting: Family

## 2022-06-14 DIAGNOSIS — Z79899 Other long term (current) drug therapy: Secondary | ICD-10-CM | POA: Diagnosis not present

## 2022-06-14 DIAGNOSIS — L4050 Arthropathic psoriasis, unspecified: Secondary | ICD-10-CM | POA: Diagnosis not present

## 2022-07-19 ENCOUNTER — Ambulatory Visit: Payer: Medicare Other | Attending: Internal Medicine | Admitting: Internal Medicine

## 2022-07-19 ENCOUNTER — Encounter: Payer: Self-pay | Admitting: Internal Medicine

## 2022-07-19 VITALS — BP 134/80 | HR 61 | Ht 63.0 in | Wt 189.0 lb

## 2022-07-19 DIAGNOSIS — I700 Atherosclerosis of aorta: Secondary | ICD-10-CM | POA: Diagnosis not present

## 2022-07-19 DIAGNOSIS — R0609 Other forms of dyspnea: Secondary | ICD-10-CM | POA: Diagnosis not present

## 2022-07-19 DIAGNOSIS — I10 Essential (primary) hypertension: Secondary | ICD-10-CM | POA: Insufficient documentation

## 2022-07-19 DIAGNOSIS — G4733 Obstructive sleep apnea (adult) (pediatric): Secondary | ICD-10-CM | POA: Insufficient documentation

## 2022-07-19 DIAGNOSIS — E782 Mixed hyperlipidemia: Secondary | ICD-10-CM | POA: Diagnosis not present

## 2022-07-19 DIAGNOSIS — R400 Somnolence: Secondary | ICD-10-CM | POA: Diagnosis not present

## 2022-07-19 DIAGNOSIS — I2584 Coronary atherosclerosis due to calcified coronary lesion: Secondary | ICD-10-CM | POA: Insufficient documentation

## 2022-07-19 DIAGNOSIS — R202 Paresthesia of skin: Secondary | ICD-10-CM | POA: Insufficient documentation

## 2022-07-19 DIAGNOSIS — I2510 Atherosclerotic heart disease of native coronary artery without angina pectoris: Secondary | ICD-10-CM | POA: Diagnosis not present

## 2022-07-19 NOTE — Progress Notes (Signed)
New Outpatient Visit Date: 07/19/2022  Referring Provider: Burnard Hawthorne, FNP [address] [place],  [place] [address]  Chief Complaint: Tingling in fingers and toes  HPI:  Ms. Axford is a 71 y.o. female who is being seen today for the evaluation of coronary artery calcification and hyperlipidemia at the request of Ms. Arnett. She has a history of hypertension, hypothyroidism, psoriasis (with psoriatic arthritis), and obstructive sleep apnea.  Ms. Ronny Flurry reports that she has been doing fairly well and does not have a history of heart disease or prior cardiac testing.  She endorses mild exertional dyspnea when walking briskly or going up several stairs.  This has been stable for at least the last 6-12 months.  She also notes that she frequently feels sleepy.  She has always been a "napper" but seems to be napping a little bit more than usual.  She was diagnosed with obstructive sleep apnea in the 1990s but does not wear CPAP as she could not tolerate several different types of masks.  She denies chest pain.  She reports occasional transient "flutters" that only last a few seconds and are without associated symptoms.  Ms. Stillman only other complaint is of tingling in her fingers and toes over the last few months.  It is somewhat positional, most pronounced when she has been sitting still for quite some time.  --------------------------------------------------------------------------------------------------  Cardiovascular History & Procedures: Cardiovascular Problems: Dyspnea on exertion Coronary artery calcification and aortic atherosclerosis  Risk Factors: Coronary artery calcification, aortic atherosclerosis, hyperlipidemia, obesity, and age greater than 37  Cath/PCI: None  CV Surgery: None  EP Procedures and Devices: None  Non-Invasive Evaluation(s): None  Recent CV Pertinent Labs: Lab Results  Component Value Date   CHOL 192 08/15/2021   HDL 40.90  08/15/2021   LDLCALC 120 (H) 08/15/2021   TRIG 151.0 (H) 08/15/2021   CHOLHDL 5 08/15/2021   K 4.1 01/19/2020   K 3.4 (L) 03/24/2013   BUN 11 01/19/2020   CREATININE 0.89 01/19/2020    --------------------------------------------------------------------------------------------------  Past Medical History:  Diagnosis Date   Anxiety    Arthritis    Cataract    GERD (gastroesophageal reflux disease)    Hypertension    Hypothyroidism    Psoriasis    Dr. Nehemiah Massed, on embrel   Sleep apnea    Wears partial dentures    upper    Past Surgical History:  Procedure Laterality Date   ABDOMINAL HYSTERECTOMY  1999   for abnormal pap, TVH. No ovaries or cervix.    BRAVO PH STUDY N/A 02/02/2022   Procedure: BRAVO [place];  Surgeon: [name], MD;  Location: [facility] ENDOSCOPY;  Service: Endoscopy;  Laterality: N/A;   BREAST EXCISIONAL BIOPSY Left 1981   benign   CATARACT EXTRACTION Bilateral    COLONOSCOPY WITH PROPOFOL N/A 12/27/2020   Procedure: COLONOSCOPY WITH PROPOFOL;  Surgeon: [name], MD;  Location: [place];  Service: Endoscopy;  Laterality: N/A;   ESOPHAGOGASTRODUODENOSCOPY N/A 02/02/2022   Procedure: ESOPHAGOGASTRODUODENOSCOPY (EGD);  Surgeon: [name], MD;  Location: [facility] ENDOSCOPY;  Service: Endoscopy;  Laterality: N/A;   THYROIDECTOMY  1976   VITRECTOMY Right 2014    Current Meds  Medication Sig   amLODipine (NORVASC) 5 MG tablet Take 1 tablet (5 mg total) by mouth daily.   atenolol (TENORMIN) 25 MG tablet Take by mouth daily.   colchicine 0.6 MG tablet TAKE 2 TABS 1 HOUR LATER BY ANOTHER 1 TAB ON FIRST DAY THEN 1 TAB  TWICE DAILY UNTIL ATTACK RESOLVED.   folic acid (FOLVITE) 1 MG tablet Take 1 mg by mouth daily.   Golimumab (SIMPONI ARIA IV) Infusion every 8 weeks   levothyroxine (SYNTHROID) 125 MCG tablet TAKE 1 TABLET BY MOUTH EVERY DAY   methotrexate (RHEUMATREX) 2.5 MG tablet Take 4 tablets by mouth once a week.   Multiple Vitamins-Minerals  (PRESERVISION AREDS 2 PO) Take by mouth daily.   omeprazole (PRILOSEC) 40 MG capsule Take 1 capsule (40 mg total) by mouth daily.   sertraline (ZOLOFT) 50 MG tablet TAKE 1 TABLET BY MOUTH EVERY DAY    Allergies: Penicillins  Social History   Tobacco Use   Smoking status: Never   Smokeless tobacco: Never  Vaping Use   Vaping Use: Never used  Substance Use Topics   Alcohol use: Yes    Alcohol/week: 4.0 standard drinks of alcohol    Types: 4 Standard drinks or equivalent per week   Drug use: No    Family History  Problem Relation Age of Onset   Breast cancer Mother 53   Lung cancer Mother    Macular degeneration Mother    Sudden death Father 60       committed suicide   Diabetes Father    Lung disease Sister        legionaires   Hepatitis C Brother    Colon cancer Neg Hx     Review of Systems: A 12-system review of systems was performed and was negative except as noted in the HPI.  --------------------------------------------------------------------------------------------------  Physical Exam: BP 134/80 (BP Location: Right Arm, Patient Position: Sitting, Cuff Size: Large)   Pulse 61   Ht '5\' 3"'$  (1.6 m)   Wt 189 lb (85.7 kg)   SpO2 99%   BMI 33.48 kg/m   General: NAD. HEENT: No conjunctival pallor or scleral icterus. Neck: Supple without lymphadenopathy, thyromegaly, JVD, or HJR. No carotid bruit. Lungs: Normal work of breathing. Clear to auscultation bilaterally without wheezes or crackles. Heart: Regular rate and rhythm without murmurs, rubs, or gallops. Non-displaced PMI. Abd: Bowel sounds present. Soft, NT/ND without hepatosplenomegaly Ext: No lower extremity edema. Radial, PT, and DP pulses are 2+ bilaterally Skin: Warm and dry without rash. Neuro: CNIII-XII intact. Strength and fine-touch sensation intact in upper and lower extremities bilaterally. Psych: Normal mood and affect.  EKG: Normal sinus rhythm with borderline left atrial enlargement.   Otherwise, no significant abnormality.  Lab Results  Component Value Date   WBC 7.3 08/15/2021   HGB 13.9 08/15/2021   HCT 41.8 08/15/2021   MCV 87.5 08/15/2021   PLT 216.0 08/15/2021    Lab Results  Component Value Date   NA 138 01/19/2020   K 4.1 01/19/2020   CL 103 01/19/2020   CO2 29 01/19/2020   BUN 11 01/19/2020   CREATININE 0.89 01/19/2020   GLUCOSE 97 01/19/2020   ALT 13 01/19/2020    Lab Results  Component Value Date   CHOL 192 08/15/2021   HDL 40.90 08/15/2021   LDLCALC 120 (H) 08/15/2021   TRIG 151.0 (H) 08/15/2021   CHOLHDL 5 08/15/2021     --------------------------------------------------------------------------------------------------  ASSESSMENT AND PLAN: Dyspnea on exertion: This has been present for at least 6-12 months and has been fairly stable.  While this could be due to a number of factors including deconditioning and aging, we have agreed to obtain a transthoracic echocardiogram to exclude structural heart abnormalities.  Coronary artery calcification, aortic atherosclerosis, and hyperlipidemia: Ms. Carbine does not have a  history of CAD.  She denies angina but has some mild exertional dyspnea.  CT of the chest in 2018 was notable for mild coronary artery calcification and aortic atherosclerosis.  Her most recent lipid panel almost a year ago was notable for mildly elevated LDL and triglycerides.  We discussed the role for statin therapy as well as formal coronary calcium scoring.  Ms. Ronny Flurry wishes to defer any medication changes or testing besides the aforementioned echocardiogram.  She will instead focus on lifestyle modifications.  I have provided her with information about the Mediterranean diet and have also encouraged her to increase her activity.  We will plan to repeat a lipid panel in 3 months, shortly before our follow-up visit.  Hypertension: Blood pressure upper normal today.  We will defer medication changes at this  time.  Paresthesias: Ms. Foland reports periodic numbness and tingling in her fingers and toes.  I would be most concerned about neuropathy.  Given her elevated hemoglobin A1c in the prediabetic range, diabetic neuropathy would be a primary concern.  We will obtain an echocardiogram, as detailed above, to exclude cardiomyopathy that could be seen with infiltrative processes such as amyloidosis that can also lead to peripheral neuropathies.  Obstructive sleep apnea and daytime somnolence: Ms. Merkle reports a long history of obstructive sleep apnea that has been untreated due to intolerance of CPAP.  She may benefit from repeat sleep evaluation to see if she would tolerate newer equipment better versus hypoglossal nerve stimulation.  Follow-up: Return to clinic in 3 months.  Nelva Bush, MD 07/19/2022 10:23 AM

## 2022-07-19 NOTE — Patient Instructions (Addendum)
Medication Instructions:  Your physician recommends that you continue on your current medications as directed. Please refer to the Current Medication list given to you today.  *If you need a refill on your cardiac medications before your next appointment, please call your pharmacy*  Lab Work: Your physician recommends that you return for lab work in: 3 months prior to your office visit with Dr. Saunders Revel come for a fasting lipid panel.  - Please go to the [facility]. You will check in at the front desk to the right as you walk into the atrium. Valet Parking is offered if needed. - No appointment needed. You may go any day between 7 am and 6 pm.  If you have labs (blood work) drawn today and your tests are completely normal, you will receive your results only by: [place] (if you have MyChart) OR A paper copy in the mail If you have any lab test that is abnormal or we need to change your treatment, we will call you to review the results.  Testing/Procedures: Your physician has requested that you have an echocardiogram. Echocardiography is a painless test that uses sound waves to create images of your heart. It provides your doctor with information about the size and shape of your heart and how well your heart's chambers and valves are working. This procedure takes approximately one hour. There are no restrictions for this procedure. Please do NOT wear cologne, perfume, aftershave, or lotions (deodorant is allowed). Please arrive 15 minutes prior to your appointment time.  Follow-Up: At [facility], you and your health needs are our priority.  As part of our continuing mission to provide you with exceptional heart care, we have created designated Provider Care Teams.  These Care Teams include your primary Cardiologist (physician) and Advanced Practice Providers (APPs -  Physician Assistants and Nurse Practitioners) who all work together to provide you with the care you need, when  you need it.  We recommend signing up for the patient portal called "MyChart".  Sign up information is provided on this After Visit Summary.  MyChart is used to connect with patients for Virtual Visits (Telemedicine).  Patients are able to view lab/test results, encounter notes, upcoming appointments, etc.  Non-urgent messages can be sent to your provider as well.   To learn more about what you can do with MyChart, go to [email or web address].    Your next appointment:   3 month(s)  The format for your next appointment:   In Person  Provider:   You may see Nelva Bush, MD or one of the following Advanced Practice Providers on your designated Care Team:   Murray Hodgkins, NP Christell Faith, PA-C Cadence Kathlen Mody, PA-C Gerrie Nordmann, NP    Important Information About Sugar

## 2022-07-24 ENCOUNTER — Ambulatory Visit (INDEPENDENT_AMBULATORY_CARE_PROVIDER_SITE_OTHER): Payer: Medicare Other | Admitting: Family

## 2022-07-24 ENCOUNTER — Encounter: Payer: Self-pay | Admitting: Family

## 2022-07-24 VITALS — BP 132/78 | HR 62 | Temp 98.6°F | Ht 63.0 in | Wt 188.4 lb

## 2022-07-24 DIAGNOSIS — E039 Hypothyroidism, unspecified: Secondary | ICD-10-CM

## 2022-07-24 DIAGNOSIS — G4733 Obstructive sleep apnea (adult) (pediatric): Secondary | ICD-10-CM | POA: Diagnosis not present

## 2022-07-24 DIAGNOSIS — I10 Essential (primary) hypertension: Secondary | ICD-10-CM | POA: Diagnosis not present

## 2022-07-24 DIAGNOSIS — I700 Atherosclerosis of aorta: Secondary | ICD-10-CM | POA: Diagnosis not present

## 2022-07-24 DIAGNOSIS — I2584 Coronary atherosclerosis due to calcified coronary lesion: Secondary | ICD-10-CM

## 2022-07-24 DIAGNOSIS — I2510 Atherosclerotic heart disease of native coronary artery without angina pectoris: Secondary | ICD-10-CM

## 2022-07-24 DIAGNOSIS — R7303 Prediabetes: Secondary | ICD-10-CM

## 2022-07-24 MED ORDER — ROSUVASTATIN CALCIUM 5 MG PO TABS: 5.0000 mg | ORAL_TABLET | Freq: Every evening | ORAL | 3 refills | Status: DC

## 2022-07-24 MED ORDER — ROSUVASTATIN CALCIUM 5 MG PO TABS: 5.0000 mg | ORAL_TABLET | Freq: Every day | ORAL | 3 refills | Status: DC

## 2022-07-24 NOTE — Assessment & Plan Note (Signed)
Overall stable.  We did discuss blood pressure guidelines and approaching 120/80.  Patient  declines escalating antihypertensives at this time.  Continue amlodipine 5 mg, atenolol 25 mg daily

## 2022-07-24 NOTE — Progress Notes (Signed)
Subjective:    Patient ID: Jennifer Patrick, female    DOB: 1951-06-25, 71 y.o.   MRN: [id number]  CC: Jennifer Patrick is a 71 y.o. female who presents today for follow up.   HPI: Feels well today.  No new complaints  Hypertension-compliant with amlodipine 5 mg, atenolol 25 mg daily  Consult Dr End 07/2022, DOE, CAD, pending echo. OSA- sleepy study obtained by [facility] 20 + years ago. Masks didn't fit well She endorses snoring. Denies HA, fatigue.     HISTORY:  Past Medical History:  Diagnosis Date   Anxiety    Arthritis    Cataract    GERD (gastroesophageal reflux disease)    Hypertension    Hypothyroidism    Psoriasis    Dr. Nehemiah Patrick, on embrel   Sleep apnea    Wears partial dentures    upper   Past Surgical History:  Procedure Laterality Date   ABDOMINAL HYSTERECTOMY  1999   for abnormal pap, TVH. No ovaries or cervix.    BRAVO PH STUDY N/A 02/02/2022   Procedure: BRAVO [place];  Surgeon: Jennifer Lame, MD;  Location: [facility] ENDOSCOPY;  Service: Endoscopy;  Laterality: N/A;   BREAST EXCISIONAL BIOPSY Left 1981   benign   CATARACT EXTRACTION Bilateral    COLONOSCOPY WITH PROPOFOL N/A 12/27/2020   Procedure: COLONOSCOPY WITH PROPOFOL;  Surgeon: Jennifer Lame, MD;  Location: [place];  Service: Endoscopy;  Laterality: N/A;   ESOPHAGOGASTRODUODENOSCOPY N/A 02/02/2022   Procedure: ESOPHAGOGASTRODUODENOSCOPY (EGD);  Surgeon: Jennifer Lame, MD;  Location: [facility] ENDOSCOPY;  Service: Endoscopy;  Laterality: N/A;   THYROIDECTOMY  1976   VITRECTOMY Right 2014   Family History  Problem Relation Age of Onset   Breast cancer Mother 27   Lung cancer Mother    Macular degeneration Mother    Sudden death Father 11       committed suicide   Diabetes Father    Lung disease Sister        legionaires   Hepatitis C Brother    Colon cancer Neg Hx     Allergies: Penicillins Current Outpatient Medications on File Prior to Visit  Medication Sig Dispense Refill    amLODipine (NORVASC) 5 MG tablet Take 1 tablet (5 mg total) by mouth daily. 90 tablet 1   atenolol (TENORMIN) 25 MG tablet Take by mouth daily.     colchicine 0.6 MG tablet TAKE 2 TABS 1 HOUR LATER BY ANOTHER 1 TAB ON FIRST DAY THEN 1 TAB TWICE DAILY UNTIL ATTACK RESOLVED. 354 tablet 1   folic acid (FOLVITE) 1 MG tablet Take 1 mg by mouth daily.     Golimumab (SIMPONI ARIA IV) Infusion every 8 weeks     levothyroxine (SYNTHROID) 125 MCG tablet TAKE 1 TABLET BY MOUTH EVERY DAY 90 tablet 1   methotrexate (RHEUMATREX) 2.5 MG tablet Take 4 tablets by mouth once a week.     Multiple Vitamins-Minerals (PRESERVISION AREDS 2 PO) Take by mouth daily.     omeprazole (PRILOSEC) 40 MG capsule Take 1 capsule (40 mg total) by mouth daily. 90 capsule 1   sertraline (ZOLOFT) 50 MG tablet TAKE 1 TABLET BY MOUTH EVERY DAY 90 tablet 1   No current facility-administered medications on file prior to visit.    Social History   Tobacco Use   Smoking status: Never   Smokeless tobacco: Never  Vaping Use   Vaping Use: Never used  Substance Use Topics   Alcohol use: Yes  Alcohol/week: 4.0 standard drinks of alcohol    Types: 4 Standard drinks or equivalent per week   Drug use: No    Review of Systems  Constitutional:  Negative for chills and fever.  Respiratory:  Negative for cough.   Cardiovascular:  Negative for chest pain and palpitations.  Gastrointestinal:  Negative for nausea and vomiting.      Objective:    BP 132/78 (BP Location: Left Arm, Patient Position: Sitting, Cuff Size: Normal)   Pulse 62   Temp 98.6 F (37 C) (Oral)   Ht '5\' 3"'$  (1.6 m)   Wt 188 lb 6.4 oz (85.5 kg)   SpO2 98%   BMI 33.37 kg/m  BP Readings from Last 3 Encounters:  07/24/22 132/78  07/19/22 134/80  04/26/22 134/80   Wt Readings from Last 3 Encounters:  07/24/22 188 lb 6.4 oz (85.5 kg)  07/19/22 189 lb (85.7 kg)  04/26/22 190 lb 3.2 oz (86.3 kg)    Physical Exam Vitals reviewed.  Constitutional:       Appearance: She is well-developed.  Eyes:     Conjunctiva/sclera: Conjunctivae normal.  Cardiovascular:     Rate and Rhythm: Normal rate and regular rhythm.     Pulses: Normal pulses.     Heart sounds: Normal heart sounds.  Pulmonary:     Effort: Pulmonary effort is normal.     Breath sounds: Normal breath sounds. No wheezing, rhonchi or rales.  Skin:    General: Skin is warm and dry.  Neurological:     Mental Status: She is alert.  Psychiatric:        Speech: Speech normal.        Behavior: Behavior normal.        Thought Content: Thought content normal.        Assessment & Plan:   Problem List Items Addressed This Visit       Cardiovascular and Mediastinum   Aortic atherosclerosis ([place]) - Primary    Long discussion as relates to aortic atherosclerosis and statin therapy as it relates to decreased risk of all cause mortality. She is willing to trial. She has follow up with Dr End. Will obtain hepatic enzymes in 6 weeks.       Relevant Medications   rosuvastatin (CRESTOR) 5 MG tablet   Other Relevant Orders   Hemoglobin A1c   Comprehensive metabolic panel   Hypertension    Overall stable.  We did discuss blood pressure guidelines and approaching 120/80.  Patient  declines escalating antihypertensives at this time.  Continue amlodipine 5 mg, atenolol 25 mg daily      Relevant Medications   rosuvastatin (CRESTOR) 5 MG tablet     Respiratory   Obstructive sleep apnea    Long discussion in regards to the importance of retesting for OSA.  In the past patient had not been able to wear CPAP mask.  Explained that technology, mass may have changed somewhat in the past 20 years.  Discussed risk of not treating OSA including risk of stroke ,heart attack.  She may consider this at a later date.        Endocrine   Hypothyroidism   Relevant Orders   TSH   Other Visit Diagnoses     Prediabetes       Relevant Orders   Hemoglobin A1c        I have changed Jennifer Patrick.  Jennifer Patrick "Jennifer Patrick"'s rosuvastatin. I am also having her maintain her Golimumab ([place] ARIA IV), Multiple Vitamins-Minerals (  PRESERVISION AREDS 2 PO), folic acid, methotrexate, atenolol, omeprazole, amLODipine, colchicine, sertraline, and levothyroxine.   Meds ordered this encounter  Medications   DISCONTD: rosuvastatin (CRESTOR) 5 MG tablet    Sig: Take 1 tablet (5 mg total) by mouth daily.    Dispense:  90 tablet    Refill:  3   rosuvastatin (CRESTOR) 5 MG tablet    Sig: Take 1 tablet (5 mg total) by mouth every evening.    Dispense:  90 tablet    Refill:  3    Return precautions given.   Risks, benefits, and alternatives of the medications and treatment plan prescribed today were discussed, and patient expressed understanding.   Education regarding symptom management and diagnosis given to patient on AVS.  Continue to follow with Burnard Hawthorne, FNP for routine health maintenance.   [name] and I agreed with plan.   Mable Paris, FNP

## 2022-07-24 NOTE — Assessment & Plan Note (Signed)
Long discussion in regards to the importance of retesting for OSA.  In the past patient had not been able to wear CPAP mask.  Explained that technology, mass may have changed somewhat in the past 20 years.  Discussed risk of not treating OSA including risk of stroke ,heart attack.  She may consider this at a later date.

## 2022-07-24 NOTE — Assessment & Plan Note (Signed)
Long discussion as relates to aortic atherosclerosis and statin therapy as it relates to decreased risk of all cause mortality. She is willing to trial. She has follow up with Dr End. Will obtain hepatic enzymes in 6 weeks.

## 2022-07-24 NOTE — Patient Instructions (Signed)
Start Crestor 5 mg.  Please keep follow-up with Dr. Saunders Revel.  As discussed, muscle aches or myalgias are the most common side effect.  Please let me know if they occur

## 2022-07-25 ENCOUNTER — Other Ambulatory Visit: Payer: Self-pay | Admitting: Internal Medicine

## 2022-07-25 DIAGNOSIS — R0609 Other forms of dyspnea: Secondary | ICD-10-CM

## 2022-07-25 DIAGNOSIS — I10 Essential (primary) hypertension: Secondary | ICD-10-CM

## 2022-07-25 DIAGNOSIS — G4733 Obstructive sleep apnea (adult) (pediatric): Secondary | ICD-10-CM

## 2022-07-25 DIAGNOSIS — I700 Atherosclerosis of aorta: Secondary | ICD-10-CM

## 2022-07-25 DIAGNOSIS — I2510 Atherosclerotic heart disease of native coronary artery without angina pectoris: Secondary | ICD-10-CM

## 2022-07-25 DIAGNOSIS — R202 Paresthesia of skin: Secondary | ICD-10-CM

## 2022-07-25 DIAGNOSIS — E782 Mixed hyperlipidemia: Secondary | ICD-10-CM

## 2022-07-25 DIAGNOSIS — R400 Somnolence: Secondary | ICD-10-CM

## 2022-08-02 ENCOUNTER — Ambulatory Visit: Payer: Medicare Other | Attending: Internal Medicine

## 2022-08-02 DIAGNOSIS — I2510 Atherosclerotic heart disease of native coronary artery without angina pectoris: Secondary | ICD-10-CM | POA: Diagnosis not present

## 2022-08-02 DIAGNOSIS — G4730 Sleep apnea, unspecified: Secondary | ICD-10-CM | POA: Insufficient documentation

## 2022-08-02 DIAGNOSIS — R0609 Other forms of dyspnea: Secondary | ICD-10-CM | POA: Diagnosis not present

## 2022-08-02 DIAGNOSIS — I10 Essential (primary) hypertension: Secondary | ICD-10-CM | POA: Diagnosis not present

## 2022-08-02 LAB — ECHOCARDIOGRAM COMPLETE
AR max vel: 1.45 cm2
AV Peak grad: 9.6 mmHg
Ao pk vel: 1.55 m/s
Area-P 1/2: 3.02 cm2
Calc EF: 72.8 %
MV M vel: 3.88 m/s
MV Peak grad: 60.2 mmHg
S' Lateral: 2.3 cm
Single Plane A2C EF: 75 %
Single Plane A4C EF: 70.6 %

## 2022-08-09 DIAGNOSIS — Z79899 Other long term (current) drug therapy: Secondary | ICD-10-CM | POA: Diagnosis not present

## 2022-08-09 DIAGNOSIS — L4050 Arthropathic psoriasis, unspecified: Secondary | ICD-10-CM | POA: Diagnosis not present

## 2022-08-18 DIAGNOSIS — Z23 Encounter for immunization: Secondary | ICD-10-CM | POA: Diagnosis not present

## 2022-09-06 DIAGNOSIS — Z6832 Body mass index (BMI) 32.0-32.9, adult: Secondary | ICD-10-CM | POA: Diagnosis not present

## 2022-09-06 DIAGNOSIS — R7989 Other specified abnormal findings of blood chemistry: Secondary | ICD-10-CM | POA: Diagnosis not present

## 2022-09-06 DIAGNOSIS — M79675 Pain in left toe(s): Secondary | ICD-10-CM | POA: Diagnosis not present

## 2022-09-06 DIAGNOSIS — M545 Low back pain, unspecified: Secondary | ICD-10-CM | POA: Diagnosis not present

## 2022-09-06 DIAGNOSIS — L401 Generalized pustular psoriasis: Secondary | ICD-10-CM | POA: Diagnosis not present

## 2022-09-06 DIAGNOSIS — R52 Pain, unspecified: Secondary | ICD-10-CM | POA: Diagnosis not present

## 2022-09-06 DIAGNOSIS — E669 Obesity, unspecified: Secondary | ICD-10-CM | POA: Diagnosis not present

## 2022-09-06 DIAGNOSIS — L4050 Arthropathic psoriasis, unspecified: Secondary | ICD-10-CM | POA: Diagnosis not present

## 2022-10-04 DIAGNOSIS — Z79899 Other long term (current) drug therapy: Secondary | ICD-10-CM | POA: Diagnosis not present

## 2022-10-04 DIAGNOSIS — L4050 Arthropathic psoriasis, unspecified: Secondary | ICD-10-CM | POA: Diagnosis not present

## 2022-10-09 ENCOUNTER — Other Ambulatory Visit: Payer: Self-pay | Admitting: Family

## 2022-10-09 DIAGNOSIS — K219 Gastro-esophageal reflux disease without esophagitis: Secondary | ICD-10-CM

## 2022-10-09 DIAGNOSIS — I10 Essential (primary) hypertension: Secondary | ICD-10-CM

## 2022-10-18 NOTE — Progress Notes (Deleted)
Follow-up Outpatient Visit Date: 10/19/2022  Primary Care Provider: Burnard Hawthorne, FNP [address] [place] [address]  Chief Complaint: ***  HPI:  Jennifer Patrick is a 72 y.o. female with history of coronary artery calcification, hypertension, hypothyroidism, psoriasis complicated by psoriatic arthritis, and obstructive sleep apnea, who presents for follow-up of dyspnea on exertion, paresthesias, and coronary artery calcification.  I met Jennifer Patrick in early November, at which time she noted mild exertional dyspnea when walking briskly or going up several stairs, stable for at least 6-12 months.  She noted occasional palpitations as well as some numbness and tingling in her fingers and toes most consistent with neuropathy.  Subsequent echo was unremarkable.  --------------------------------------------------------------------------------------------------  Cardiovascular History & Procedures: Cardiovascular Problems: Dyspnea on exertion Coronary artery calcification and aortic atherosclerosis   Risk Factors: Coronary artery calcification, aortic atherosclerosis, hyperlipidemia, obesity, and age greater than 28   Cath/PCI: None   CV Surgery: None   EP Procedures and Devices: None   Non-Invasive Evaluation(s): TTE (08/02/2022): Normal LV size and wall thickness.  LVEF 60-65% with normal diastolic function.  GLS -22.7%.  Normal biatrial size.  No significant valvular abnormalities.  Normal CVP.  Recent CV Pertinent Labs: Lab Results  Component Value Date   CHOL 192 08/15/2021   HDL 40.90 08/15/2021   LDLCALC 120 (H) 08/15/2021   TRIG 151.0 (H) 08/15/2021   CHOLHDL 5 08/15/2021   K 4.1 01/19/2020   K 3.4 (L) 03/24/2013   BUN 11 01/19/2020   CREATININE 0.89 01/19/2020    Past medical and surgical history were reviewed and updated in EPIC.  No outpatient medications have been marked as taking for the 10/19/22 encounter (Appointment) with , Harrell Gave,  MD.    Allergies: Penicillins  Social History   Tobacco Use   Smoking status: Never   Smokeless tobacco: Never  Vaping Use   Vaping Use: Never used  Substance Use Topics   Alcohol use: Yes    Alcohol/week: 4.0 standard drinks of alcohol    Types: 4 Standard drinks or equivalent per week   Drug use: No    Family History  Problem Relation Age of Onset   Breast cancer Mother 81   Lung cancer Mother    Macular degeneration Mother    Sudden death Father 69       committed suicide   Diabetes Father    Lung disease Sister        legionaires   Hepatitis C Brother    Colon cancer Neg Hx     Review of Systems: A 12-system review of systems was performed and was negative except as noted in the HPI.  --------------------------------------------------------------------------------------------------  Physical Exam: There were no vitals taken for this visit.  General:  NAD. Neck: No JVD or HJR. Lungs: Clear to auscultation bilaterally without wheezes or crackles. Heart: Regular rate and rhythm without murmurs, rubs, or gallops. Abdomen: Soft, nontender, nondistended. Extremities: No lower extremity edema.  EKG:  ***  Lab Results  Component Value Date   WBC 7.3 08/15/2021   HGB 13.9 08/15/2021   HCT 41.8 08/15/2021   MCV 87.5 08/15/2021   PLT 216.0 08/15/2021    Lab Results  Component Value Date   NA 138 01/19/2020   K 4.1 01/19/2020   CL 103 01/19/2020   CO2 29 01/19/2020   BUN 11 01/19/2020   CREATININE 0.89 01/19/2020   GLUCOSE 97 01/19/2020   ALT 13 01/19/2020    Lab Results  Component Value Date   CHOL 192 08/15/2021   HDL 40.90 08/15/2021   LDLCALC 120 (H) 08/15/2021   TRIG 151.0 (H) 08/15/2021   CHOLHDL 5 08/15/2021    --------------------------------------------------------------------------------------------------  ASSESSMENT AND PLAN: Jennifer Bush, MD 10/18/2022 8:39 AM

## 2022-10-19 ENCOUNTER — Ambulatory Visit: Payer: Medicare Other | Admitting: Internal Medicine

## 2022-11-04 IMAGING — MG MM DIGITAL SCREENING BILAT W/ TOMO AND CAD
8 series · 8 of 24 positions shown · non-contrast
Comparison: Previous exam(s).

CLINICAL DATA: Screening.

EXAM:
DIGITAL SCREENING BILATERAL MAMMOGRAM WITH TOMOSYNTHESIS AND CAD
TECHNIQUE: Bilateral screening digital craniocaudal and mediolateral oblique
mammograms were obtained. Bilateral screening digital breast
tomosynthesis was performed. The images were evaluated with
computer-aided detection.

[L CC synth-2D]
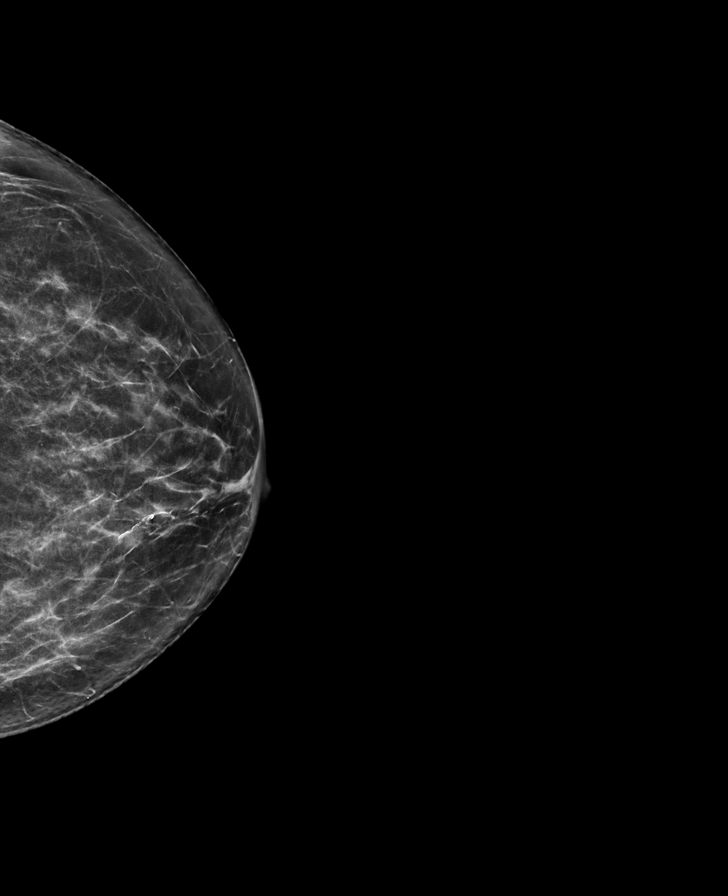

[R MLO synth-2D]
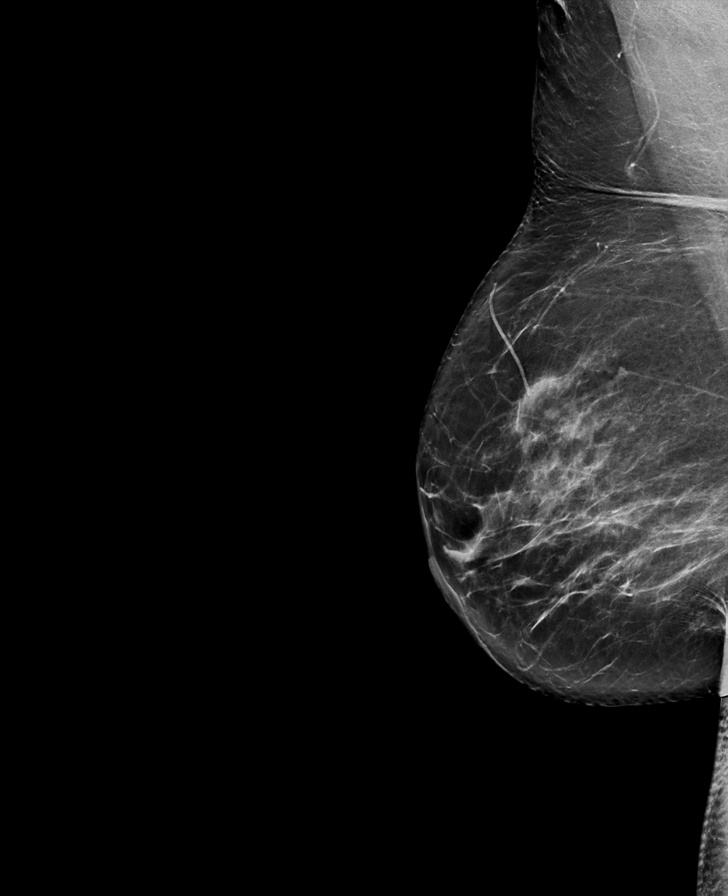

[R CC synth-2D]
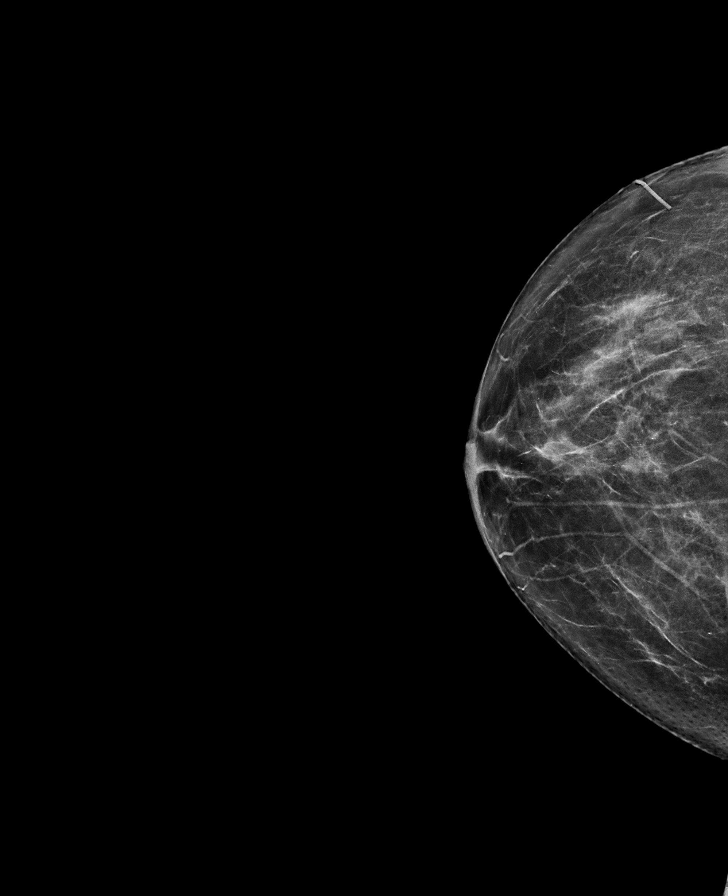

[L MLO synth-2D]
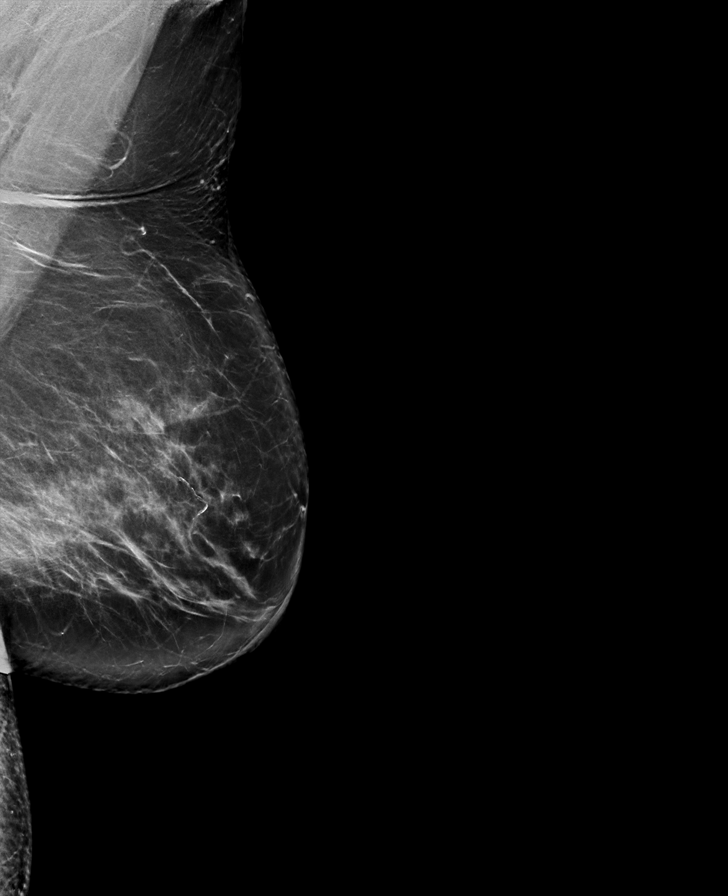

[R CC tomo · tomo slice 34/67.0]
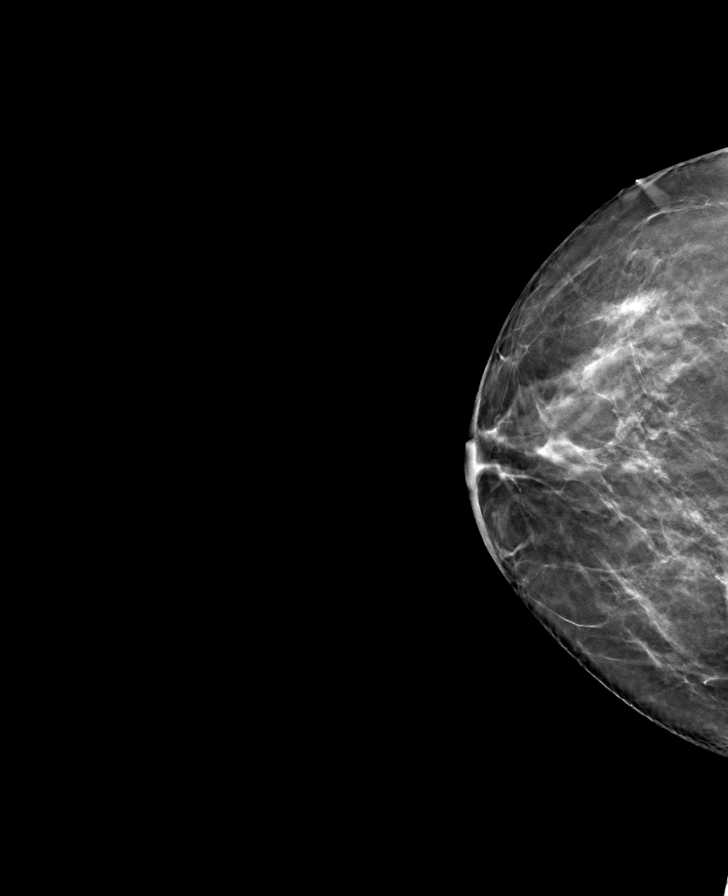

[L CC tomo · tomo slice 33/65.0]
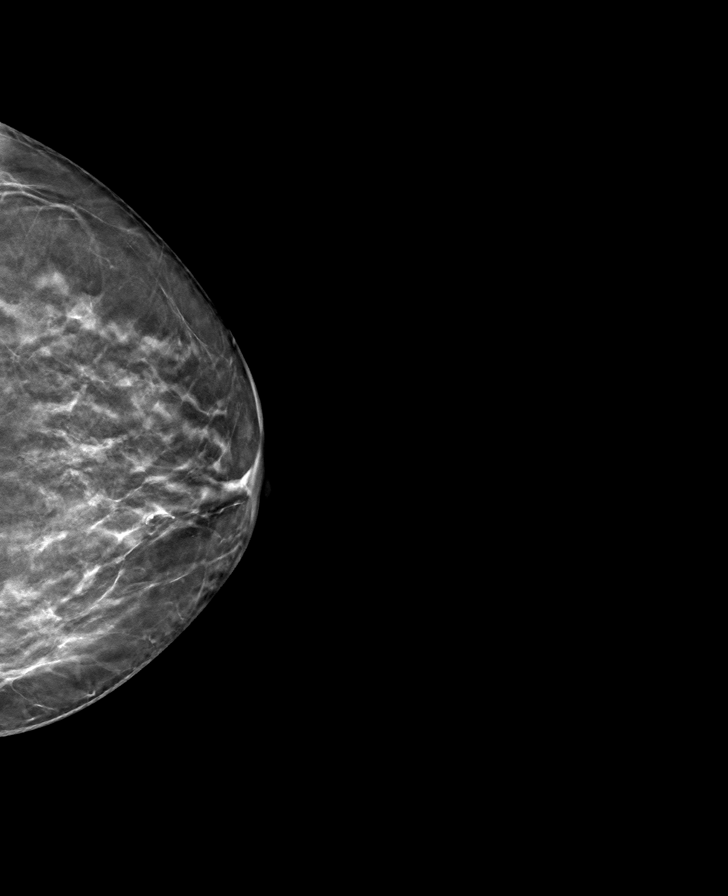

[R MLO tomo · tomo slice 41/81.0]
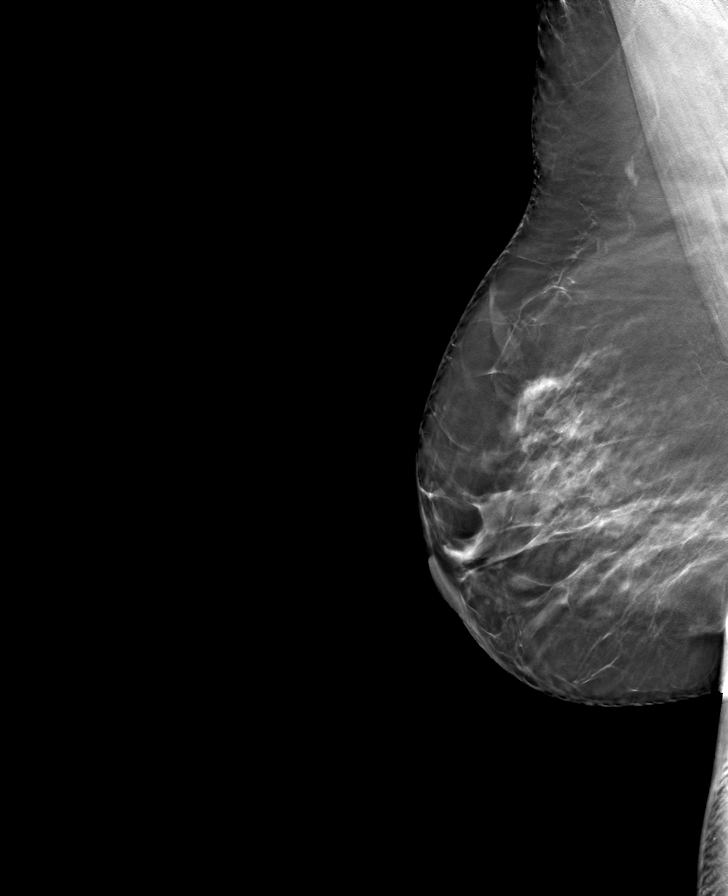

[L MLO tomo · tomo slice 45/88.0]
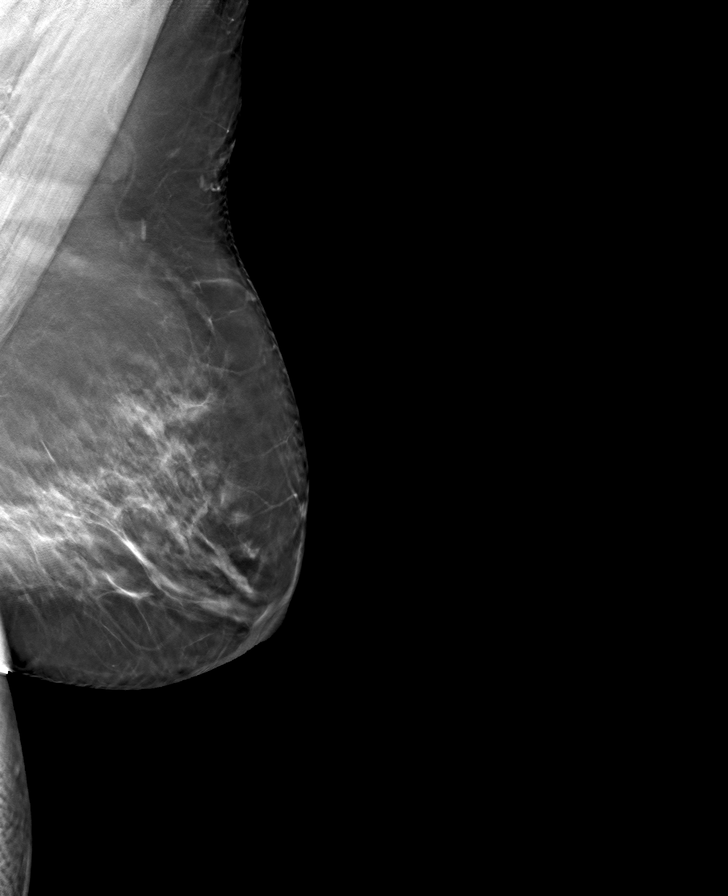

[8 of 24 positions shown; findings below may reference images not displayed]

ACR Breast Density Category b: There are scattered areas of
fibroglandular density.
FINDINGS: There are no findings suspicious for malignancy.
IMPRESSION: No mammographic evidence of malignancy. A result letter of this
screening mammogram will be mailed directly to the patient.

RECOMMENDATION:
Screening mammogram in one year. (Code:51-O-LD2)

BI-RADS CATEGORY  1: Negative.

## 2022-11-20 DIAGNOSIS — H43813 Vitreous degeneration, bilateral: Secondary | ICD-10-CM | POA: Diagnosis not present

## 2022-11-20 DIAGNOSIS — H353134 Nonexudative age-related macular degeneration, bilateral, advanced atrophic with subfoveal involvement: Secondary | ICD-10-CM | POA: Diagnosis not present

## 2022-11-20 DIAGNOSIS — H35033 Hypertensive retinopathy, bilateral: Secondary | ICD-10-CM | POA: Diagnosis not present

## 2022-11-29 DIAGNOSIS — L4050 Arthropathic psoriasis, unspecified: Secondary | ICD-10-CM | POA: Diagnosis not present

## 2022-11-29 DIAGNOSIS — Z79899 Other long term (current) drug therapy: Secondary | ICD-10-CM | POA: Diagnosis not present

## 2022-12-12 ENCOUNTER — Telehealth: Payer: Self-pay | Admitting: Family

## 2022-12-12 NOTE — Telephone Encounter (Signed)
Called patient to schedule Medicare Annual Wellness Visit (AWV). Left message for patient to call back and schedule Medicare Annual Wellness Visit (AWV).  Last date of AWV: 12/14/2021   Please schedule an AWVS appointment at any time with [place].  If any questions, please contact me at [phone].   Thank you,  [place] Direct dial  [phone]

## 2023-01-18 DIAGNOSIS — M79675 Pain in left toe(s): Secondary | ICD-10-CM | POA: Diagnosis not present

## 2023-01-18 DIAGNOSIS — R52 Pain, unspecified: Secondary | ICD-10-CM | POA: Diagnosis not present

## 2023-01-18 DIAGNOSIS — M25512 Pain in left shoulder: Secondary | ICD-10-CM | POA: Diagnosis not present

## 2023-01-18 DIAGNOSIS — L401 Generalized pustular psoriasis: Secondary | ICD-10-CM | POA: Diagnosis not present

## 2023-01-18 DIAGNOSIS — R7989 Other specified abnormal findings of blood chemistry: Secondary | ICD-10-CM | POA: Diagnosis not present

## 2023-01-18 DIAGNOSIS — M545 Low back pain, unspecified: Secondary | ICD-10-CM | POA: Diagnosis not present

## 2023-01-18 DIAGNOSIS — Z6832 Body mass index (BMI) 32.0-32.9, adult: Secondary | ICD-10-CM | POA: Diagnosis not present

## 2023-01-18 DIAGNOSIS — L4050 Arthropathic psoriasis, unspecified: Secondary | ICD-10-CM | POA: Diagnosis not present

## 2023-01-18 DIAGNOSIS — E669 Obesity, unspecified: Secondary | ICD-10-CM | POA: Diagnosis not present

## 2023-01-22 ENCOUNTER — Encounter: Payer: Self-pay | Admitting: Family

## 2023-01-22 ENCOUNTER — Telehealth: Payer: Self-pay | Admitting: Family

## 2023-01-22 ENCOUNTER — Ambulatory Visit (INDEPENDENT_AMBULATORY_CARE_PROVIDER_SITE_OTHER): Payer: Medicare Other | Admitting: Family

## 2023-01-22 VITALS — BP 126/84 | HR 69 | Temp 97.8°F | Ht 63.0 in | Wt 180.4 lb

## 2023-01-22 DIAGNOSIS — M25512 Pain in left shoulder: Secondary | ICD-10-CM

## 2023-01-22 DIAGNOSIS — M545 Low back pain, unspecified: Secondary | ICD-10-CM | POA: Diagnosis not present

## 2023-01-22 DIAGNOSIS — Z1231 Encounter for screening mammogram for malignant neoplasm of breast: Secondary | ICD-10-CM

## 2023-01-22 DIAGNOSIS — I700 Atherosclerosis of aorta: Secondary | ICD-10-CM

## 2023-01-22 DIAGNOSIS — R319 Hematuria, unspecified: Secondary | ICD-10-CM | POA: Diagnosis not present

## 2023-01-22 LAB — LIPID PANEL
Cholesterol: 184 mg/dL (ref 0–200)
HDL: 56.8 mg/dL (ref >39.00)
LDL Cholesterol: 104 mg/dL — ABNORMAL HIGH (ref 0–99)
NonHDL: 127.27
Total CHOL/HDL Ratio: 3
Triglycerides: 116 mg/dL (ref 0.0–149.0)
VLDL: 23.2 mg/dL (ref 0.0–40.0)

## 2023-01-22 LAB — URINALYSIS, ROUTINE W REFLEX MICROSCOPIC
Bilirubin Urine: NEGATIVE
Ketones, ur: NEGATIVE
Nitrite: NEGATIVE
Specific Gravity, Urine: 1.015 (ref 1.000–1.030)
Total Protein, Urine: NEGATIVE
Urine Glucose: NEGATIVE
Urobilinogen, UA: 0.2 (ref 0.0–1.0)
pH: 7 (ref 5.0–8.0)

## 2023-01-22 NOTE — Telephone Encounter (Signed)
Need OV notes from past couple visit and Xrays , [facility] Arlyss Repress

## 2023-01-22 NOTE — Telephone Encounter (Signed)
Spoke to [facility] [phone]) and she stated that she will fax  over last ov notes and x-rays fax # was given [phone]

## 2023-01-22 NOTE — Progress Notes (Unsigned)
Assessment & Plan:  Encounter for screening mammogram for malignant neoplasm of breast -     3D Screening Mammogram, Left and Right; Future  Hematuria, unspecified type -     Urinalysis, Routine w reflex microscopic -     Urine Culture     Return precautions given.   Risks, benefits, and alternatives of the medications and treatment plan prescribed today were discussed, and patient expressed understanding.   Education regarding symptom management and diagnosis given to patient on AVS either electronically or printed.  Return for [employer] upcoming or due, schedule.  Rennie Plowman, FNP  Subjective:    Patient ID: Jennifer Patrick, female    DOB: 10-Sep-1951, 72 y.o.   MRN: [id number]  CC: Jennifer Patrick is a 72 y.o. female who presents today for follow up.   HPI: She complains of left arm and shoulder pain She has similar left shoulder pain in the past several years ago  She had MRI shoulder years ago with Dr Gavin Potters.  She had left shoulder xray last week with rheumatology; she reports negative.  She had steriod injection with rheumatology last week without improvement.   Pain worse with twisting in the upper arm. No numbness in arm. She has chronic neck pain for years.  No injury.  She does play on an ipad quite a bit.   Right handed.   Two weeks ago, she saw BRB on tissue when wiping after urinating. She has two episodes since. Urine has since been cloudy.  She had dysuria at that time  She has right low back pain for the past couple of years, which has been subsided. Back pain is worse with certain movements such when she starts to stand and moves a certain way, pain will be extreme. She has an lumbar xray 08/2022 which was normal.    She is able to sleep on either hip.   Tylenol or ibuprofen with resolution prn. No rectal bleeding , constipation, fever, nausea, fever, saddle anesthesia, groin pain, leg numbness  No injury  No h/o renal stone.       Colonoscopy 12/27/20  with internal hemorrhoids and diverticulitis  She is not taking crestor.   Known degenerative facet disease seen lumbar xray 01/19/2020   Allergies: Penicillins Current Outpatient Medications on File Prior to Visit  Medication Sig Dispense Refill   amLODipine (NORVASC) 5 MG tablet TAKE 1 TABLET (5 MG TOTAL) BY MOUTH DAILY. 90 tablet 1   atenolol (TENORMIN) 25 MG tablet Take by mouth daily.     colchicine 0.6 MG tablet TAKE 2 TABS 1 HOUR LATER BY ANOTHER 1 TAB ON FIRST DAY THEN 1 TAB TWICE DAILY UNTIL ATTACK RESOLVED. 180 tablet 1   folic acid (FOLVITE) 1 MG tablet Take 1 mg by mouth daily.     Golimumab (SIMPONI ARIA IV) Infusion every 8 weeks     levothyroxine (SYNTHROID) 125 MCG tablet TAKE 1 TABLET BY MOUTH EVERY DAY 90 tablet 1   methotrexate (RHEUMATREX) 2.5 MG tablet Take 4 tablets by mouth once a week.     Multiple Vitamins-Minerals (PRESERVISION AREDS 2 PO) Take by mouth daily.     omeprazole (PRILOSEC) 40 MG capsule TAKE 1 CAPSULE (40 MG TOTAL) BY MOUTH DAILY. 90 capsule 1   sertraline (ZOLOFT) 50 MG tablet TAKE 1 TABLET BY MOUTH EVERY DAY 90 tablet 1   rosuvastatin (CRESTOR) 5 MG tablet Take 1 tablet (5 mg total) by mouth every evening. (Patient not taking: Reported on  01/22/2023) 90 tablet 3   No current facility-administered medications on file prior to visit.    Review of Systems    Objective:    BP 126/84   Pulse 69   Temp 97.8 F (36.6 C) (Oral)   Ht 5\' 3"  (1.6 m)   Wt 180 lb 6.4 oz (81.8 kg)   SpO2 98%   BMI 31.96 kg/m  BP Readings from Last 3 Encounters:  01/22/23 126/84  07/24/22 132/78  07/19/22 134/80   Wt Readings from Last 3 Encounters:  01/22/23 180 lb 6.4 oz (81.8 kg)  07/24/22 188 lb 6.4 oz (85.5 kg)  07/19/22 189 lb (85.7 kg)    Physical Exam

## 2023-01-23 DIAGNOSIS — M545 Low back pain, unspecified: Secondary | ICD-10-CM | POA: Insufficient documentation

## 2023-01-23 DIAGNOSIS — R319 Hematuria, unspecified: Secondary | ICD-10-CM | POA: Insufficient documentation

## 2023-01-23 NOTE — Assessment & Plan Note (Signed)
Patient confident blood coming from vagina versus rectum.  Deferred pelvic exam at this time.  urinalysis shows red blood cells.  Pending urine culture at this time. Concern for UTI.

## 2023-01-23 NOTE — Assessment & Plan Note (Signed)
Chronic.  Known arthritic changes from previous x-ray 2021.  Pending referral to orthopedics for second opinion. Consider PT.

## 2023-01-23 NOTE — Assessment & Plan Note (Signed)
Presentation consistent with [facility] joint arthritis.  I do have concerns in regards to rotator cuff pathology as well .  Awaiting x-ray which was performed at rheumatology. Pending MRI left shoulder and orthopedic consult due to failure of improvement after corticosteroid injection.  Hesitant to refer to physical therapy until we obtain MRI shoulder and consult with orthopedic.

## 2023-01-24 DIAGNOSIS — R5383 Other fatigue: Secondary | ICD-10-CM | POA: Diagnosis not present

## 2023-01-24 DIAGNOSIS — L4050 Arthropathic psoriasis, unspecified: Secondary | ICD-10-CM | POA: Diagnosis not present

## 2023-01-24 DIAGNOSIS — Z111 Encounter for screening for respiratory tuberculosis: Secondary | ICD-10-CM | POA: Diagnosis not present

## 2023-01-24 DIAGNOSIS — Z79899 Other long term (current) drug therapy: Secondary | ICD-10-CM | POA: Diagnosis not present

## 2023-01-25 LAB — URINE CULTURE
MICRO NUMBER:: 14917629
SPECIMEN QUALITY:: ADEQUATE

## 2023-01-26 NOTE — Addendum Note (Signed)
Addended by: [place],  on: 01/26/2023 03:25 PM   Modules accepted: Orders

## 2023-02-01 ENCOUNTER — Ambulatory Visit
Admission: RE | Admit: 2023-02-01 | Discharge: 2023-02-01 | Disposition: A | Discharge status: Home / Self Care | Payer: Medicare Other | Source: Ambulatory Visit | Attending: Family | Admitting: Family

## 2023-02-01 ENCOUNTER — Other Ambulatory Visit (INDEPENDENT_AMBULATORY_CARE_PROVIDER_SITE_OTHER): Payer: Medicare Other

## 2023-02-01 DIAGNOSIS — R319 Hematuria, unspecified: Secondary | ICD-10-CM

## 2023-02-01 DIAGNOSIS — M25512 Pain in left shoulder: Secondary | ICD-10-CM | POA: Insufficient documentation

## 2023-02-01 DIAGNOSIS — M67814 Other specified disorders of tendon, left shoulder: Secondary | ICD-10-CM | POA: Diagnosis not present

## 2023-02-02 LAB — URINALYSIS, ROUTINE W REFLEX MICROSCOPIC
Bilirubin Urine: NEGATIVE
Hgb urine dipstick: NEGATIVE
Nitrite: NEGATIVE
Specific Gravity, Urine: 1.025 (ref 1.000–1.030)
Urine Glucose: NEGATIVE
Urobilinogen, UA: 0.2 (ref 0.0–1.0)
pH: 6 (ref 5.0–8.0)

## 2023-02-09 DIAGNOSIS — M25512 Pain in left shoulder: Secondary | ICD-10-CM | POA: Diagnosis not present

## 2023-02-22 ENCOUNTER — Other Ambulatory Visit: Payer: Self-pay | Admitting: Family

## 2023-02-23 DIAGNOSIS — Z6832 Body mass index (BMI) 32.0-32.9, adult: Secondary | ICD-10-CM | POA: Diagnosis not present

## 2023-02-23 DIAGNOSIS — J209 Acute bronchitis, unspecified: Secondary | ICD-10-CM | POA: Diagnosis not present

## 2023-03-21 DIAGNOSIS — Z79899 Other long term (current) drug therapy: Secondary | ICD-10-CM | POA: Diagnosis not present

## 2023-03-21 DIAGNOSIS — L4050 Arthropathic psoriasis, unspecified: Secondary | ICD-10-CM | POA: Diagnosis not present

## 2023-04-20 ENCOUNTER — Ambulatory Visit (INDEPENDENT_AMBULATORY_CARE_PROVIDER_SITE_OTHER): Payer: Medicare Other | Admitting: Urology

## 2023-04-20 ENCOUNTER — Encounter: Payer: Self-pay | Admitting: Urology

## 2023-04-20 ENCOUNTER — Other Ambulatory Visit: Payer: Self-pay

## 2023-04-20 ENCOUNTER — Other Ambulatory Visit
Admission: RE | Admit: 2023-04-20 | Discharge: 2023-04-20 | Disposition: A | Discharge status: Home / Self Care | Payer: Medicare Other | Attending: Urology | Admitting: Urology

## 2023-04-20 VITALS — BP 131/78 | HR 81 | Ht 62.0 in | Wt 183.0 lb

## 2023-04-20 DIAGNOSIS — R310 Gross hematuria: Secondary | ICD-10-CM

## 2023-04-20 DIAGNOSIS — M545 Low back pain, unspecified: Secondary | ICD-10-CM | POA: Diagnosis not present

## 2023-04-20 DIAGNOSIS — R3129 Other microscopic hematuria: Secondary | ICD-10-CM

## 2023-04-20 DIAGNOSIS — R8281 Pyuria: Secondary | ICD-10-CM

## 2023-04-20 LAB — URINALYSIS, COMPLETE (UACMP) WITH MICROSCOPIC
Glucose, UA: NEGATIVE mg/dL
Nitrite: NEGATIVE
Protein, ur: 100 mg/dL — AB
RBC / HPF: >50 RBC/hpf (ref 0–5)
Specific Gravity, Urine: >1.03 — ABNORMAL HIGH (ref 1.005–1.030)
pH: 5.5 (ref 5.0–8.0)

## 2023-04-20 NOTE — Patient Instructions (Signed)

## 2023-04-20 NOTE — Progress Notes (Signed)
Marcelle Overlie Plume,acting as a scribe for Vanna Scotland, MD.,have documented all relevant documentation on the behalf of Vanna Scotland, MD,as directed by  Vanna Scotland, MD while in the presence of Vanna Scotland, MD.  04/20/2023 12:38 PM   Joylene Grapes 14-Jan-1951 [id number]  Referring provider: Allegra Grana, FNP [address] [place],  [place] [address]  Chief Complaint  Patient presents with   Hematuria    HPI: 72 year-old female who presents today for further evaluation of possible kidney stones. She self-referred today.   Notably, she gave a urinalysis 2 months ago that showed a small amount of blood in her urine, microscopic 11-30 RBC per high power field as well as calcium oxalate crystals. A urine culture was negative. At the time, the patient was feeling like the blood was coming from her rectum or vagina and no further evaluation was done which is what is documented however the patient herself does not agree with this.   She has no cross-sectional imaging.   Her urinalysis today showed just 3-6 WBC per high power field and calcium oxalate crystals, but no blood.   She has had recurrent episodes of dark urine, with one instance of pure blood in the urine last Saturday after increased physical activity. She denies any history of smoking or occupational exposure to dyes. She has no family history of kidney or bladder cancer. She also mentioned a past episode three years ago where she was hospitalized and had her gallbladder removed, during which a renal stone was noted in her medical records. She has no current UTI symptoms such as burning, urgency, or frequency.  She notes right lower back pain and points to the area of her right sacroiliac joint. She reports having psoriatic arthritis.  She reports the pain is worse when she gets in and out of a car or changes position.   PMH: Past Medical History:  Diagnosis Date   Anxiety    Arthritis    Cataract     GERD (gastroesophageal reflux disease)    Hypertension    Hypothyroidism    Psoriasis    Dr. Gwen Pounds, on embrel   Sleep apnea    Wears partial dentures    upper    Surgical History: Past Surgical History:  Procedure Laterality Date   ABDOMINAL HYSTERECTOMY  1999   for abnormal pap, TVH. No ovaries or cervix.    BRAVO PH STUDY N/A 02/02/2022   Procedure: BRAVO PH STUDY;  Surgeon: Midge Minium, MD;  Location: [facility] ENDOSCOPY;  Service: Endoscopy;  Laterality: N/A;   BREAST EXCISIONAL BIOPSY Left 1981   benign   CATARACT EXTRACTION Bilateral    COLONOSCOPY WITH PROPOFOL N/A 12/27/2020   Procedure: COLONOSCOPY WITH PROPOFOL;  Surgeon: Midge Minium, MD;  [facility];  Service: Endoscopy;  Laterality: N/A;   ESOPHAGOGASTRODUODENOSCOPY N/A 02/02/2022   Procedure: ESOPHAGOGASTRODUODENOSCOPY (EGD);  Surgeon: Midge Minium, MD;  Location: [facility] ENDOSCOPY;  Service: Endoscopy;  Laterality: N/A;   THYROIDECTOMY  1976   VITRECTOMY Right 2014    Home Medications:  Allergies as of 04/20/2023       Reactions   Penicillins    rash        Medication List        Accurate as of April 20, 2023 12:38 PM. If you have any questions, ask your nurse or doctor.          STOP taking these medications    folic acid 1  MG tablet Commonly known as: FOLVITE Stopped by: Vanna Scotland   methotrexate 2.5 MG tablet Commonly known as: RHEUMATREX Stopped by: Vanna Scotland   rosuvastatin 5 MG tablet Commonly known as: Crestor Stopped by: Vanna Scotland       TAKE these medications    amLODipine 5 MG tablet Commonly known as: NORVASC TAKE 1 TABLET (5 MG TOTAL) BY MOUTH DAILY.   atenolol 25 MG tablet Commonly known as: TENORMIN TAKE 1 TABLET BY MOUTH EVERY DAY   colchicine 0.6 MG tablet TAKE 2 TABS 1 HOUR LATER BY ANOTHER 1 TAB ON FIRST DAY THEN 1 TAB TWICE DAILY UNTIL ATTACK RESOLVED.   levothyroxine 125 MCG tablet Commonly known as: SYNTHROID TAKE 1 TABLET BY  MOUTH EVERY DAY   omeprazole 40 MG capsule Commonly known as: PRILOSEC TAKE 1 CAPSULE (40 MG TOTAL) BY MOUTH DAILY.   PRESERVISION AREDS 2 PO Take by mouth daily.   sertraline 50 MG tablet Commonly known as: ZOLOFT TAKE 1 TABLET BY MOUTH EVERY DAY   SIMPONI ARIA IV Infusion every 8 weeks        Allergies:  Allergies  Allergen Reactions   Penicillins     rash    Family History: Family History  Problem Relation Age of Onset   Breast cancer Mother 59   Lung cancer Mother    Macular degeneration Mother    Sudden death Father 89       committed suicide   Diabetes Father    Lung disease Sister        legionaires   Hepatitis C Brother    Colon cancer Neg Hx     Social History:  reports that she has never smoked. She has never used smokeless tobacco. She reports current alcohol use of about 4.0 standard drinks of alcohol per week. She reports that she does not use drugs.   Physical Exam: BP 131/78 (BP Location: Left Arm, Patient Position: Sitting, Cuff Size: Large)   Pulse 81   Ht 5\' 2"  (1.575 m)   Wt 183 lb (83 kg)   BMI 33.47 kg/m   Constitutional:  Alert and oriented, No acute distress. HEENT: [place] AT, moist mucus membranes.  Trachea midline, no masses. GU: No CVA tenderness Neurologic: Grossly intact, no focal deficits, moving all 4 extremities. Psychiatric: Normal mood and affect.  Assessment & Plan:    1. Gross hematuria/microscopic hematuria - Discussed the importance of increasing hydration - She had a few occasions where it was unclear whether it was gross hematuria, but had one that she was quite certain was.  - High risk based on the nature of her bleeding - Plan for CT, urogram, and cystoscopy - Differential diagnosis includes kidney stones, kidney tumors, and ureteral tumors etc  2. Right low back pain - Based on the nature of her pain, exacerbated by position as well as location, it is unlikely that this is related to kidney stones  3.  Possible history of incidental kidney stone - She mentioned that she may have had one on a scan quite some time ago. We will be able to reassess that with her hematuria evaluation  4. Pyuria - She is asymptomatic today. Possibly a contaminant. We will not send a culture or treat in the absence of symptoms.   Return in about 1 month (around 05/21/2023) for cystoscopy.  I have reviewed the above documentation for accuracy and completeness, and I agree with the above.   Vanna Scotland, MD   [facility] [address] [place], [place] [address] [phone]

## 2023-05-01 ENCOUNTER — Ambulatory Visit
Admission: RE | Admit: 2023-05-01 | Discharge: 2023-05-01 | Disposition: A | Discharge status: Home / Self Care | Payer: Medicare Other | Source: Ambulatory Visit | Attending: Urology | Admitting: Urology

## 2023-05-01 DIAGNOSIS — R3129 Other microscopic hematuria: Secondary | ICD-10-CM | POA: Diagnosis not present

## 2023-05-01 DIAGNOSIS — N200 Calculus of kidney: Secondary | ICD-10-CM | POA: Diagnosis not present

## 2023-05-01 DIAGNOSIS — R161 Splenomegaly, not elsewhere classified: Secondary | ICD-10-CM | POA: Diagnosis not present

## 2023-05-01 DIAGNOSIS — R935 Abnormal findings on diagnostic imaging of other abdominal regions, including retroperitoneum: Secondary | ICD-10-CM | POA: Diagnosis not present

## 2023-05-01 DIAGNOSIS — K5730 Diverticulosis of large intestine without perforation or abscess without bleeding: Secondary | ICD-10-CM | POA: Diagnosis not present

## 2023-05-01 LAB — POCT I-STAT CREATININE: Creatinine, Ser: 0.8 mg/dL (ref 0.44–1.00)

## 2023-05-01 MED ORDER — IOHEXOL 300 MG/ML  SOLN
100.0000 mL | Freq: Once | INTRAMUSCULAR | Status: AC | PRN
  Administered 2023-05-01: 100 mL via INTRAVENOUS

## 2023-05-10 ENCOUNTER — Other Ambulatory Visit: Payer: Self-pay | Admitting: Family

## 2023-05-10 DIAGNOSIS — K219 Gastro-esophageal reflux disease without esophagitis: Secondary | ICD-10-CM

## 2023-05-11 ENCOUNTER — Ambulatory Visit: Payer: Medicare Other | Admitting: Urology

## 2023-05-11 ENCOUNTER — Encounter: Payer: Self-pay | Admitting: Urology

## 2023-05-11 VITALS — BP 147/85 | HR 93 | Ht 63.0 in | Wt 183.0 lb

## 2023-05-11 DIAGNOSIS — N200 Calculus of kidney: Secondary | ICD-10-CM

## 2023-05-11 DIAGNOSIS — R310 Gross hematuria: Secondary | ICD-10-CM

## 2023-05-11 NOTE — Patient Instructions (Signed)
Ureteroscopy  Ureteroscopy is a procedure to check for and treat problems inside part of the urinary tract. In this procedure, a long rigid or flexible tube with a lens and light at the end (ureteroscope) is used to look at the inside of the kidneys and the ureters. The ureters are the tubes that carry urine from the kidneys to the bladder. The ureteroscope is inserted into one or both of the ureters. You may need this procedure if you have frequent urinary tract infections (UTIs), blood in your urine, or a stone in one or both of your ureters. A ureteroscopy can be done: To find the cause of urine blockage in a ureter and to evaluate other abnormalities inside the ureters or kidneys. To remove stones. To remove or treat growths of tissue (polyps), abnormal tissue, and some types of tumors. To remove a tissue sample and check it for disease under a microscope (biopsy). Tell a health care provider about: Any allergies you have. All medicines you are taking, including vitamins, herbs, eye drops, creams, and over-the-counter medicines. Any problems you or family members have had with anesthetic medicines. Any bleeding problems you have. Any surgeries you have had. Any medical conditions you have. Whether you are pregnant or may be pregnant. What are the risks? Your health care provider will talk with you about risks. These may include: Abdominal pain or a burning feeling or pain while urinating. Abnormal bleeding. A UTI. Allergic reactions to medicines. Scarring that narrows the ureter (stricture) or swelling. Creating a hole (perforation) in the ureter. Damage to other structures or organs, such as the part of your body that drains urine from your bladder (urethra), your bladder, or your uterus. What happens before the procedure? When to stop eating and drinking  8 hours before your procedure Stop eating most foods. Do not eat meat, fried foods, or fatty foods. Eat only light foods, such  as toast or crackers. All liquids are okay except energy drinks and alcohol. 6 hours before your procedure Stop eating. Drink only clear liquids, such as water, clear fruit juice, black coffee, plain tea, and sports drinks. Do not drink energy drinks or alcohol. 2 hours before your procedure Stop drinking all liquids. You may be allowed to take medicines with small sips of water. Medicines Ask your health care provider about: Changing or stopping your regular medicines. These include any diabetes medicines or blood thinners you take. Taking medicines such as aspirin and ibuprofen. These medicines can thin your blood. Do not take these medicines unless your health care provider tells you to. Taking over-the-counter medicines, vitamins, herbs, and supplements. General instructions Do not use any products that contain nicotine or tobacco for at least 4 weeks before the procedure. These products include cigarettes, chewing tobacco, and vaping devices, such as e-cigarettes. If you need help quitting, ask your health care provider. If you will be going home right after the procedure, plan to have a responsible adult: Take you home from the hospital or clinic. You will not be allowed to drive. Care for you for the time you are told. Ask your health care provider what steps will be taken to help prevent infection. These may include: Washing skin with a soap that kills germs. Receiving antibiotic medicine. Tests You may have an exam or testing. You may have a urine sample taken to check for infection. What happens during the procedure? An IV will be inserted into one of your veins. You may be given: A sedative. This helps   you relax. Anesthesia. This will: Numb certain areas of your body. Make you fall asleep for surgery. Your urethra will be cleaned with a germ-killing solution. The ureteroscope will be passed through your urethra into your bladder. A salt-water solution will be sent through  the ureteroscope to fill your bladder. This will help the health care provider see the openings of your ureters more clearly. The ureteroscope will be passed into your ureter. If a growth is found, a biopsy may be done. If a stone is found, it may be removed through the ureteroscope, or the stone may be broken up using a laser, shock waves, or electrical energy. In some cases, if the ureter is too small, a tube may be inserted that keeps the ureter open (ureteral stent). The stent may be left in place for 1 or 2 weeks, and then the ureteroscopy procedure will be done again. The scope will be removed, and your bladder will be emptied. The procedure may vary among health care providers and hospitals. What happens after the procedure? Your blood pressure, heart rate, breathing rate, and blood oxygen level will be monitored until you leave the hospital or clinic. It is up to you to get the results of your procedure. Ask your health care provider, or the department that is doing the procedure, when your results will be ready. Summary Ureteroscopy is a procedure used to look at the inside of the kidneys and the ureters. You may need this procedure if you have frequent urinary tract infections (UTIs), blood in your urine, or a stone in one or both of your ureters. Follow instructions from your health care provider about eating and drinking. In some cases, if the ureter is too small, a tube may be inserted that keeps the ureter open (ureteral stent). The stent may be left in place for 1 or 2 weeks to keep the ureter open, and then the ureteroscopy procedure will be done again. This information is not intended to replace advice given to you by your health care provider. Make sure you discuss any questions you have with your health care provider. Document Revised: 08/07/2022 Document Reviewed: 08/07/2022 Elsevier Patient Education  2024 [employer]  

## 2023-05-11 NOTE — H&P (View-Only) (Signed)
 Marcelle Overlie Plume,acting as a scribe for Jennifer Scotland, MD.,have documented all relevant documentation on the behalf of Jennifer Scotland, MD,as directed by  Jennifer Scotland, MD while in the presence of Jennifer Scotland, MD.  05/11/2023 12:22 PM   Joylene Grapes Oct 29, 1950 [id number]  Referring provider: Allegra Grana, FNP [address] [place],  [place] [address]  Chief Complaint  Patient presents with   Nephrolithiasis    HPI: 72 year-old female who returns today for a follow up of hematuria.   She underwent a CT urogram on 05/04/2023 that showed a 1.1 cm right renal pelvic stone along with additional bilateral nonobstructive renal calculi.    Today, she reports persistent hematuria and describes the pain as excessively painful and nagging, which has been ongoing for several months. She experiences intermittent pain, likely due to the stone moving within the renal pelvis. She spends about 50% of her time in [place] and the other 50% at [employer], and she is concerned about the sudden onset of pain while traveling. She prefers a definitive treatment to resolve the issue.   PMH: Past Medical History:  Diagnosis Date   Anxiety    Arthritis    Cataract    GERD (gastroesophageal reflux disease)    Hypertension    Hypothyroidism    Psoriasis    Dr. Gwen Pounds, on embrel   Sleep apnea    Wears partial dentures    upper    Surgical History: Past Surgical History:  Procedure Laterality Date   ABDOMINAL HYSTERECTOMY  1999   for abnormal pap, TVH. No ovaries or cervix.    BRAVO PH STUDY N/A 02/02/2022   Procedure: BRAVO PH STUDY;  Surgeon: Midge Minium, MD;  Location: [facility] ENDOSCOPY;  Service: Endoscopy;  Laterality: N/A;   BREAST EXCISIONAL BIOPSY Left 1981   benign   CATARACT EXTRACTION Bilateral    COLONOSCOPY WITH PROPOFOL N/A 12/27/2020   Procedure: COLONOSCOPY WITH PROPOFOL;  Surgeon: Midge Minium, MD;  Location: [facility];  Service:  Endoscopy;  Laterality: N/A;   ESOPHAGOGASTRODUODENOSCOPY N/A 02/02/2022   Procedure: ESOPHAGOGASTRODUODENOSCOPY (EGD);  Surgeon: Midge Minium, MD;  Location: [facility] ENDOSCOPY;  Service: Endoscopy;  Laterality: N/A;   THYROIDECTOMY  1976   VITRECTOMY Right 2014    Home Medications:  Allergies as of 05/11/2023       Reactions   Penicillins    rash        Medication List        Accurate as of May 11, 2023 12:22 PM. If you have any questions, ask your nurse or doctor.          STOP taking these medications    colchicine 0.6 MG tablet Stopped by: Jennifer Patrick       TAKE these medications    amLODipine 5 MG tablet Commonly known as: NORVASC TAKE 1 TABLET (5 MG TOTAL) BY MOUTH DAILY.   atenolol 25 MG tablet Commonly known as: TENORMIN TAKE 1 TABLET BY MOUTH EVERY DAY   levothyroxine 125 MCG tablet Commonly known as: SYNTHROID TAKE 1 TABLET BY MOUTH EVERY DAY   omeprazole 40 MG capsule Commonly known as: PRILOSEC TAKE 1 CAPSULE (40 MG TOTAL) BY MOUTH DAILY.   PRESERVISION AREDS 2 PO Take by mouth daily.   sertraline 50 MG tablet Commonly known as: ZOLOFT TAKE 1 TABLET BY MOUTH EVERY DAY   SIMPONI ARIA IV Infusion every 8 weeks        Allergies:  Allergies  Allergen Reactions   Penicillins     rash    Family History: Family History  Problem Relation Age of Onset   Breast cancer Mother 35   Lung cancer Mother    Macular degeneration Mother    Sudden death Father 14       committed suicide   Diabetes Father    Lung disease Sister        legionaires   Hepatitis C Brother    Colon cancer Neg Hx     Social History:  reports that she has never smoked. She has never used smokeless tobacco. She reports current alcohol use of about 4.0 standard drinks of alcohol per week. She reports that she does not use drugs.   Physical Exam: BP (!) 147/85 (BP Location: Left Arm, Patient Position: Sitting, Cuff Size: Large)   Pulse 93   Ht 5\' 3"  (1.6  m)   Wt 183 lb (83 kg)   BMI 32.42 kg/m   Constitutional:  Alert and oriented, No acute distress. HEENT: [place] AT, moist mucus membranes.  Trachea midline, no masses. Neurologic: Grossly intact, no focal deficits, moving all 4 extremities. Psychiatric: Normal mood and affect.   Pertinent Imaging:  Narrative CLINICAL DATA:  Hematuria  EXAM: CT ABDOMEN AND PELVIS WITHOUT AND WITH CONTRAST  TECHNIQUE: Multidetector CT imaging of the abdomen and pelvis was performed following the standard protocol before and following the bolus administration of intravenous contrast.  RADIATION DOSE REDUCTION: This exam was performed according to the departmental dose-optimization program which includes automated exposure control, adjustment of the mA and/or kV according to patient size and/or use of iterative reconstruction technique.  CONTRAST:  OMNIPAQUE IOHEXOL 300 MG/ML  SOLN  COMPARISON:  None Available.  FINDINGS: Lower chest: No acute abnormality. Small hiatal hernia with intrathoracic position of the gastric fundus.  Hepatobiliary: No focal liver abnormality is seen. Status post cholecystectomy. No biliary dilatation.  Pancreas: Unremarkable. No pancreatic ductal dilatation or surrounding inflammatory changes.  Spleen: Mild splenomegaly, maximum span 13.3 cm.  Adrenals/Urinary Tract: Adrenal glands are unremarkable. Calculus within the right renal pelvis measuring 1.1 cm with associated mucosal thickening and hyperenhancement (series 2, image 37). Additional punctuate bilateral nonobstructive renal calculi. No hydronephrosis. Simple, benign left renal cortical cyst, for which no further follow-up or characterization is required. No urinary tract filling defect on delayed phase. Bladder is unremarkable.  Stomach/Bowel: Stomach is within normal limits. Appendix appears normal. No evidence of bowel wall thickening, distention, or inflammatory changes. Sigmoid  diverticulosis.  Vascular/Lymphatic: Scattered aortic atherosclerosis. No enlarged abdominal or pelvic lymph nodes.  Reproductive: Status post hysterectomy.  Other: No abdominal wall hernia or abnormality. No ascites.  Musculoskeletal: No acute or significant osseous findings.  IMPRESSION: 1. Calculus within the right renal pelvis measuring 1.1 cm with associated mucosal thickening and hyperenhancement. 2. Additional punctuate bilateral nonobstructive renal calculi. No hydronephrosis. 3. No urinary tract filling defect on delayed phase. 4. Mild splenomegaly. 5. Sigmoid diverticulosis without evidence of acute diverticulitis.  Aortic Atherosclerosis (ICD10-I70.0).   Electronically Signed By: Jearld Lesch M.D. On: 05/04/2023 21:55  This was personally reviewed and I agree with the radiologic interpretation.    Assessment & Plan:    1. Right renal pelvis stone - We discussed various treatment options for urolithiasis including observation with or without medical expulsive therapy, shockwave lithotripsy (SWL), ureteroscopy and laser lithotripsy with stent placement, and percutaneous nephrolithotomy.   We discussed that management is based on stone size, location, density, patient co-morbidities, and  patient preference.    Stones <65mm in size have a >80% spontaneous passage rate. Data surrounding the use of tamsulosin for medical expulsive therapy is controversial, but meta analyses suggests it is most efficacious for distal stones between 5-39mm in size. Possible side effects include dizziness/lightheadedness, and retrograde ejaculation.   SWL has a lower stone free rate in a single procedure, but also a lower complication rate compared to ureteroscopy and avoids a stent and associated stent related symptoms. Possible complications include renal hematoma, steinstrasse, and need for additional treatment. We discussed the role of his increased skin to stone distance can lead to  decreased efficacy with shockwave lithotripsy.   Ureteroscopy with laser lithotripsy and stent placement has a higher stone free rate than SWL in a single procedure, however increased complication rate including possible infection, ureteral injury, bleeding, and stent related morbidity. Common stent related symptoms include dysuria, urgency/frequency, and flank pain.   After an extensive discussion of the risks and benefits of the above treatment options, the patient would like to proceed with ureteroscopy.  - Plan to schedule ureteroscopy, including addressing smaller stones in the same kidney. - Pre-op urine and urine culture to be done closer to the surgery date to rule out any urinary tract infection. - Informed about the necessity of a stent post-procedure and possible discomfort associated with it. - Informational handout was provided about the procedure  2. Hematuria - Likely secondary to the renal pelvic stone. - Will reassess post-procedure to ensure resolution. -Cysto at time of ureterscopy   Return for ureteroscopy.  I have reviewed the above documentation for accuracy and completeness, and I agree with the above.   Jennifer Scotland, MD    [facility] [address] [place], [place] [address] [phone]

## 2023-05-11 NOTE — Progress Notes (Signed)
Jennifer Patrick,acting as a scribe for Vanna Scotland, MD.,have documented all relevant documentation on the behalf of Vanna Scotland, MD,as directed by  Vanna Scotland, MD while in the presence of Vanna Scotland, MD.  05/11/2023 12:22 PM   Jennifer Patrick Oct 29, 1950 [id number]  Referring provider: Allegra Grana, FNP [address] [place],  [place] [address]  Chief Complaint  Patient presents with   Nephrolithiasis    HPI: 72 year-old female who returns today for a follow up of hematuria.   She underwent a CT urogram on 05/04/2023 that showed a 1.1 cm right renal pelvic stone along with additional bilateral nonobstructive renal calculi.    Today, she reports persistent hematuria and describes the pain as excessively painful and nagging, which has been ongoing for several months. She experiences intermittent pain, likely due to the stone moving within the renal pelvis. She spends about 50% of her time in [place] and the other 50% at [employer], and she is concerned about the sudden onset of pain while traveling. She prefers a definitive treatment to resolve the issue.   PMH: Past Medical History:  Diagnosis Date   Anxiety    Arthritis    Cataract    GERD (gastroesophageal reflux disease)    Hypertension    Hypothyroidism    Psoriasis    Dr. Gwen Pounds, on embrel   Sleep apnea    Wears partial dentures    upper    Surgical History: Past Surgical History:  Procedure Laterality Date   ABDOMINAL HYSTERECTOMY  1999   for abnormal pap, TVH. No ovaries or cervix.    BRAVO PH STUDY N/A 02/02/2022   Procedure: BRAVO PH STUDY;  Surgeon: Midge Minium, MD;  Location: [facility] ENDOSCOPY;  Service: Endoscopy;  Laterality: N/A;   BREAST EXCISIONAL BIOPSY Left 1981   benign   CATARACT EXTRACTION Bilateral    COLONOSCOPY WITH PROPOFOL N/A 12/27/2020   Procedure: COLONOSCOPY WITH PROPOFOL;  Surgeon: Midge Minium, MD;  Location: [facility];  Service:  Endoscopy;  Laterality: N/A;   ESOPHAGOGASTRODUODENOSCOPY N/A 02/02/2022   Procedure: ESOPHAGOGASTRODUODENOSCOPY (EGD);  Surgeon: Midge Minium, MD;  Location: [facility] ENDOSCOPY;  Service: Endoscopy;  Laterality: N/A;   THYROIDECTOMY  1976   VITRECTOMY Right 2014    Home Medications:  Allergies as of 05/11/2023       Reactions   Penicillins    rash        Medication List        Accurate as of May 11, 2023 12:22 PM. If you have any questions, ask your nurse or doctor.          STOP taking these medications    colchicine 0.6 MG tablet Stopped by: Vanna Scotland       TAKE these medications    amLODipine 5 MG tablet Commonly known as: NORVASC TAKE 1 TABLET (5 MG TOTAL) BY MOUTH DAILY.   atenolol 25 MG tablet Commonly known as: TENORMIN TAKE 1 TABLET BY MOUTH EVERY DAY   levothyroxine 125 MCG tablet Commonly known as: SYNTHROID TAKE 1 TABLET BY MOUTH EVERY DAY   omeprazole 40 MG capsule Commonly known as: PRILOSEC TAKE 1 CAPSULE (40 MG TOTAL) BY MOUTH DAILY.   PRESERVISION AREDS 2 PO Take by mouth daily.   sertraline 50 MG tablet Commonly known as: ZOLOFT TAKE 1 TABLET BY MOUTH EVERY DAY   SIMPONI ARIA IV Infusion every 8 weeks        Allergies:  Allergies  Allergen Reactions   Penicillins     rash    Family History: Family History  Problem Relation Age of Onset   Breast cancer Mother 35   Lung cancer Mother    Macular degeneration Mother    Sudden death Father 14       committed suicide   Diabetes Father    Lung disease Sister        legionaires   Hepatitis C Brother    Colon cancer Neg Hx     Social History:  reports that she has never smoked. She has never used smokeless tobacco. She reports current alcohol use of about 4.0 standard drinks of alcohol per week. She reports that she does not use drugs.   Physical Exam: BP (!) 147/85 (BP Location: Left Arm, Patient Position: Sitting, Cuff Size: Large)   Pulse 93   Ht 5\' 3"  (1.6  m)   Wt 183 lb (83 kg)   BMI 32.42 kg/m   Constitutional:  Alert and oriented, No acute distress. HEENT: [place] AT, moist mucus membranes.  Trachea midline, no masses. Neurologic: Grossly intact, no focal deficits, moving all 4 extremities. Psychiatric: Normal mood and affect.   Pertinent Imaging:  Narrative CLINICAL DATA:  Hematuria  EXAM: CT ABDOMEN AND PELVIS WITHOUT AND WITH CONTRAST  TECHNIQUE: Multidetector CT imaging of the abdomen and pelvis was performed following the standard protocol before and following the bolus administration of intravenous contrast.  RADIATION DOSE REDUCTION: This exam was performed according to the departmental dose-optimization program which includes automated exposure control, adjustment of the mA and/or kV according to patient size and/or use of iterative reconstruction technique.  CONTRAST:  OMNIPAQUE IOHEXOL 300 MG/ML  SOLN  COMPARISON:  None Available.  FINDINGS: Lower chest: No acute abnormality. Small hiatal hernia with intrathoracic position of the gastric fundus.  Hepatobiliary: No focal liver abnormality is seen. Status post cholecystectomy. No biliary dilatation.  Pancreas: Unremarkable. No pancreatic ductal dilatation or surrounding inflammatory changes.  Spleen: Mild splenomegaly, maximum span 13.3 cm.  Adrenals/Urinary Tract: Adrenal glands are unremarkable. Calculus within the right renal pelvis measuring 1.1 cm with associated mucosal thickening and hyperenhancement (series 2, image 37). Additional punctuate bilateral nonobstructive renal calculi. No hydronephrosis. Simple, benign left renal cortical cyst, for which no further follow-up or characterization is required. No urinary tract filling defect on delayed phase. Bladder is unremarkable.  Stomach/Bowel: Stomach is within normal limits. Appendix appears normal. No evidence of bowel wall thickening, distention, or inflammatory changes. Sigmoid  diverticulosis.  Vascular/Lymphatic: Scattered aortic atherosclerosis. No enlarged abdominal or pelvic lymph nodes.  Reproductive: Status post hysterectomy.  Other: No abdominal wall hernia or abnormality. No ascites.  Musculoskeletal: No acute or significant osseous findings.  IMPRESSION: 1. Calculus within the right renal pelvis measuring 1.1 cm with associated mucosal thickening and hyperenhancement. 2. Additional punctuate bilateral nonobstructive renal calculi. No hydronephrosis. 3. No urinary tract filling defect on delayed phase. 4. Mild splenomegaly. 5. Sigmoid diverticulosis without evidence of acute diverticulitis.  Aortic Atherosclerosis (ICD10-I70.0).   Electronically Signed By: Jearld Lesch M.D. On: 05/04/2023 21:55  This was personally reviewed and I agree with the radiologic interpretation.    Assessment & Plan:    1. Right renal pelvis stone - We discussed various treatment options for urolithiasis including observation with or without medical expulsive therapy, shockwave lithotripsy (SWL), ureteroscopy and laser lithotripsy with stent placement, and percutaneous nephrolithotomy.   We discussed that management is based on stone size, location, density, patient co-morbidities, and  patient preference.    Stones <65mm in size have a >80% spontaneous passage rate. Data surrounding the use of tamsulosin for medical expulsive therapy is controversial, but meta analyses suggests it is most efficacious for distal stones between 5-39mm in size. Possible side effects include dizziness/lightheadedness, and retrograde ejaculation.   SWL has a lower stone free rate in a single procedure, but also a lower complication rate compared to ureteroscopy and avoids a stent and associated stent related symptoms. Possible complications include renal hematoma, steinstrasse, and need for additional treatment. We discussed the role of his increased skin to stone distance can lead to  decreased efficacy with shockwave lithotripsy.   Ureteroscopy with laser lithotripsy and stent placement has a higher stone free rate than SWL in a single procedure, however increased complication rate including possible infection, ureteral injury, bleeding, and stent related morbidity. Common stent related symptoms include dysuria, urgency/frequency, and flank pain.   After an extensive discussion of the risks and benefits of the above treatment options, the patient would like to proceed with ureteroscopy.  - Plan to schedule ureteroscopy, including addressing smaller stones in the same kidney. - Pre-op urine and urine culture to be done closer to the surgery date to rule out any urinary tract infection. - Informed about the necessity of a stent post-procedure and possible discomfort associated with it. - Informational handout was provided about the procedure  2. Hematuria - Likely secondary to the renal pelvic stone. - Will reassess post-procedure to ensure resolution. -Cysto at time of ureterscopy   Return for ureteroscopy.  I have reviewed the above documentation for accuracy and completeness, and I agree with the above.   Vanna Scotland, MD    [facility] [address] [place], [place] [address] [phone]

## 2023-05-14 ENCOUNTER — Other Ambulatory Visit: Payer: Self-pay

## 2023-05-14 ENCOUNTER — Telehealth: Payer: Self-pay

## 2023-05-14 DIAGNOSIS — N201 Calculus of ureter: Secondary | ICD-10-CM

## 2023-05-14 NOTE — Progress Notes (Signed)
Surgical Physician Order Form [facility]  Dr. Vanna Scotland, MD  * Scheduling expectation : Next Available  *Length of Case:  *Clearance needed: no  *Anticoagulation Instructions: Hold all anticoagulants  *Aspirin Instructions: Ok to continue Aspirin  *Post-op visit Date/Instructions:  6 weeks with RUS prior  *Diagnosis: Right UPJ Stone  *Procedure: right Ureteroscopy w/laser lithotripsy & stent placement (69629)   Additional orders: N/A  -Admit type: OUTpatient  -Anesthesia: General  -VTE Prophylaxis Standing Order SCD's      Other:  -Standing Lab Orders Per Anesthesia    Lab other: UA&Urine Culture  -Standing Test orders EKG/Chest x-ray per Anesthesia    Test other:  - Medications:  Ancef 2gm IV  -Other orders:  N/A

## 2023-05-14 NOTE — Telephone Encounter (Signed)
Per Dr. Apolinar Junes, Patient is to be scheduled for Right Ureteroscopy with Laser Lithotripsy and Stent Placement   Mrs. Villard was contacted and possible surgical dates were discussed, Thursday September 5th, 2024 was agreed upon for surgery.   Patient was instructed that Dr. Apolinar Junes will require them to provide a pre-op UA & CX prior to surgery. This was ordered and scheduled drop off appointment was made for 05/15/2023.    Patient was directed to call [phone] between 1-3pm the day before surgery to find out surgical arrival time.  Instructions were given not to eat or drink from midnight on the night before surgery and have a driver for the day of surgery. On the surgery day patient was instructed to enter through the [facility] entrance of [facility] report the Same Day Surgery desk.   Pre-Admit Testing will be in contact via phone to set up an interview with the anesthesia team to review your history and medications prior to surgery.   Reminder of this information was sent via MyChart to the patient.

## 2023-05-14 NOTE — Progress Notes (Signed)
   [facility] Posting Form  Surgery Date: Date: 05/24/2023  Surgeon: Dr. Vanna Scotland, MD  Inpt ( No  )   Outpt (Yes)   Obs ( No  )   Diagnosis: Right Ureteropelvic Junction Stone  -CPT: [phone]  Surgery: Right Ureteroscopy with Laser Lithotripsy and Stent Placement   Stop Anticoagulations: Yes, may continue ASA  Cardiac/Medical/Pulmonary Clearance needed: no  *Orders entered into EPIC  Date: 05/14/23   *Case booked in [place]  Date: 05/14/23  *Notified pt of Surgery: Date: 05/14/23  PRE-OP UA & CX: yes, will obtain in clinic tomorrow 05/15/2023  *Placed into Prior Authorization Work Angela Nevin Date: 05/14/23  Assistant/laser/rep:No

## 2023-05-15 ENCOUNTER — Other Ambulatory Visit: Payer: Medicare Other

## 2023-05-15 DIAGNOSIS — N201 Calculus of ureter: Secondary | ICD-10-CM | POA: Diagnosis not present

## 2023-05-15 LAB — URINALYSIS, COMPLETE
Bilirubin, UA: NEGATIVE
Glucose, UA: NEGATIVE
Ketones, UA: NEGATIVE
Nitrite, UA: NEGATIVE
Specific Gravity, UA: >1.03 — ABNORMAL HIGH (ref 1.005–1.030)
Urobilinogen, Ur: 0.2 mg/dL (ref 0.2–1.0)
pH, UA: 5.5 (ref 5.0–7.5)

## 2023-05-15 LAB — MICROSCOPIC EXAMINATION: RBC, Urine: >30 /hpf — AB (ref 0–2)

## 2023-05-18 ENCOUNTER — Encounter
Admission: RE | Admit: 2023-05-18 | Discharge: 2023-05-18 | Disposition: A | Discharge status: Home / Self Care | Payer: Medicare Other | Source: Ambulatory Visit | Attending: Urology | Admitting: Urology

## 2023-05-18 ENCOUNTER — Encounter: Payer: Self-pay | Admitting: Urology

## 2023-05-18 ENCOUNTER — Other Ambulatory Visit: Payer: Self-pay

## 2023-05-18 VITALS — Ht 63.0 in | Wt 184.0 lb

## 2023-05-18 DIAGNOSIS — I10 Essential (primary) hypertension: Secondary | ICD-10-CM

## 2023-05-18 DIAGNOSIS — E782 Mixed hyperlipidemia: Secondary | ICD-10-CM

## 2023-05-18 DIAGNOSIS — G4733 Obstructive sleep apnea (adult) (pediatric): Secondary | ICD-10-CM

## 2023-05-18 DIAGNOSIS — N289 Disorder of kidney and ureter, unspecified: Secondary | ICD-10-CM

## 2023-05-18 DIAGNOSIS — I700 Atherosclerosis of aorta: Secondary | ICD-10-CM

## 2023-05-18 HISTORY — DX: Personal history of urinary calculi: Z87.442

## 2023-05-18 NOTE — Patient Instructions (Signed)
Your procedure is scheduled on: Thursday 05/24/23 To find out your arrival time, please call [phone] between 1PM - 3PM on:   Wednesday 05/23/23 Report to the Registration Desk on the 1st floor of the [facility]. FREE Valet parking is available.  If your arrival time is 6:00 am, do not arrive before that time as the [facility] entrance doors do not open until 6:00 am.  REMEMBER: Instructions that are not followed completely may result in serious medical risk, up to and including death; or upon the discretion of your surgeon and anesthesiologist your surgery may need to be rescheduled.  Do not eat food or drink any liquids after midnight the night before surgery.  No gum chewing or hard candies.  One week prior to surgery: Stop Anti-inflammatories (NSAIDS) such as Advil, Aleve, Ibuprofen, Motrin, Naproxen, Naprosyn and Aspirin based products such as Excedrin, Goody's Powder, BC Powder. You may however, continue to take Tylenol if needed for pain up until the day of surgery.  Stop ANY OVER THE COUNTER supplements until after surgery. Hold your Preservision starting today 05/18/23  Continue taking all prescribed medications.   TAKE ONLY THESE MEDICATIONS THE MORNING OF SURGERY WITH A SIP OF WATER:  amLODipine (NORVASC) 5 MG tablet  atenolol (TENORMIN) 25 MG tablet  levothyroxine (SYNTHROID) 125 MCG tablet  sertraline (ZOLOFT) 50 MG tablet  omeprazole (PRILOSEC) 40 MG capsule Antacid (take one the night before and one on the morning of surgery - helps to prevent nausea after surgery.)  No Alcohol for 24 hours before or after surgery.  No Smoking including e-cigarettes for 24 hours before surgery.  No chewable tobacco products for at least 6 hours before surgery.  No nicotine patches on the day of surgery.  Do not use any "recreational" drugs for at least a week (preferably 2 weeks) before your surgery.  Please be advised that the combination of cocaine and anesthesia may have  negative outcomes, up to and including death. If you test positive for cocaine, your surgery will be cancelled.  On the morning of surgery brush your teeth with toothpaste and water, you may rinse your mouth with mouthwash if you wish. Do not swallow any toothpaste or mouthwash.  Use CHG Soap or wipes as directed on instruction sheet. Shower with your regular soap.  Do not wear lotions, powders, or perfumes.   Do not shave body hair from the neck down 48 hours before surgery.  Wear comfortable clothing (specific to your surgery type) to the hospital.  Do not wear jewelry, make-up, hairpins, clips or nail polish.  Contact lenses, hearing aids and dentures may not be worn into surgery.  Do not bring valuables to the hospital. [facility] is not responsible for any missing/lost belongings or valuables.   Notify your doctor if there is any change in your medical condition (cold, fever, infection).  If you are being discharged the day of surgery, you will not be allowed to drive home. You will need a responsible individual to drive you home and stay with you for 24 hours after surgery.   If you are taking public transportation, you will need to have a responsible individual with you.  If you are being admitted to the hospital overnight, leave your suitcase in the car. After surgery it may be brought to your room.  In case of increased patient census, it may be necessary for you, the patient, to continue your postoperative care in the Same Day Surgery department.  After surgery,  you can help prevent lung complications by doing breathing exercises.  Take deep breaths and cough every 1-2 hours. Your doctor may order a device called an Incentive Spirometer to help you take deep breaths. When coughing or sneezing, hold a pillow firmly against your incision with both hands. This is called "splinting." Doing this helps protect your incision. It also decreases belly discomfort.  Surgery  Visitation Policy:  Patients undergoing a surgery or procedure may have two family members or support persons with them as long as the person is not COVID-19 positive or experiencing its symptoms.   Inpatient Visitation:    Visiting hours are 7 a.m. to 8 p.m. Up to four visitors are allowed at one time in a patient room. The visitors may rotate out with other people during the day. One designated support person (adult) may remain overnight.  Please call the Pre-admissions Testing Dept. at [phone] if you have any questions about these instructions.

## 2023-05-22 ENCOUNTER — Encounter
Admission: RE | Admit: 2023-05-22 | Discharge: 2023-05-22 | Disposition: A | Discharge status: Home / Self Care | Payer: Medicare Other | Source: Ambulatory Visit | Attending: Urology | Admitting: Urology

## 2023-05-22 ENCOUNTER — Encounter: Payer: Self-pay | Admitting: Urgent Care

## 2023-05-22 ENCOUNTER — Other Ambulatory Visit: Payer: Self-pay | Admitting: Family

## 2023-05-22 ENCOUNTER — Telehealth: Payer: Self-pay | Admitting: Urology

## 2023-05-22 DIAGNOSIS — I700 Atherosclerosis of aorta: Secondary | ICD-10-CM | POA: Diagnosis not present

## 2023-05-22 DIAGNOSIS — E782 Mixed hyperlipidemia: Secondary | ICD-10-CM | POA: Diagnosis not present

## 2023-05-22 DIAGNOSIS — Z01812 Encounter for preprocedural laboratory examination: Secondary | ICD-10-CM | POA: Diagnosis not present

## 2023-05-22 DIAGNOSIS — Z01818 Encounter for other preprocedural examination: Secondary | ICD-10-CM | POA: Diagnosis present

## 2023-05-22 DIAGNOSIS — G4733 Obstructive sleep apnea (adult) (pediatric): Secondary | ICD-10-CM | POA: Insufficient documentation

## 2023-05-22 DIAGNOSIS — Z01810 Encounter for preprocedural cardiovascular examination: Secondary | ICD-10-CM | POA: Diagnosis not present

## 2023-05-22 DIAGNOSIS — I10 Essential (primary) hypertension: Secondary | ICD-10-CM | POA: Diagnosis not present

## 2023-05-22 DIAGNOSIS — N289 Disorder of kidney and ureter, unspecified: Secondary | ICD-10-CM | POA: Diagnosis not present

## 2023-05-22 LAB — BASIC METABOLIC PANEL
Anion gap: 7 (ref 5–15)
BUN: 10 mg/dL (ref 8–23)
CO2: 26 mmol/L (ref 22–32)
Calcium: 8.6 mg/dL — ABNORMAL LOW (ref 8.9–10.3)
Chloride: 108 mmol/L (ref 98–111)
Creatinine, Ser: 0.73 mg/dL (ref 0.44–1.00)
GFR, Estimated: >60 mL/min (ref >60)
Glucose, Bld: 113 mg/dL — ABNORMAL HIGH (ref 70–99)
Potassium: 3.3 mmol/L — ABNORMAL LOW (ref 3.5–5.1)
Sodium: 141 mmol/L (ref 135–145)

## 2023-05-22 LAB — CBC
HCT: 40.6 % (ref 36.0–46.0)
Hemoglobin: 13.6 g/dL (ref 12.0–15.0)
MCH: 29 pg (ref 26.0–34.0)
MCHC: 33.5 g/dL (ref 30.0–36.0)
MCV: 86.6 fL (ref 80.0–100.0)
Platelets: 191 10*3/uL (ref 150–400)
RBC: 4.69 MIL/uL (ref 3.87–5.11)
RDW: 13.1 % (ref 11.5–15.5)
WBC: 6.8 10*3/uL (ref 4.0–10.5)
nRBC: 0 % (ref 0.0–0.2)

## 2023-05-22 MED ORDER — SULFAMETHOXAZOLE-TRIMETHOPRIM 800-160 MG PO TABS: 1.0000 | ORAL_TABLET | Freq: Two times a day (BID) | ORAL | 0 refills | Status: DC

## 2023-05-22 NOTE — Telephone Encounter (Signed)
Spoke with Jennifer Patrick in the lab, culture can not be sped up to finalize.  Patient is concerned and is upset if her surgery has to be cancelled due to accomodations she has had to make

## 2023-05-22 NOTE — Telephone Encounter (Signed)
Patient has called and left voicemail requesting a call back from [place]. She has a question about previous conversation, and needs clarification.

## 2023-05-22 NOTE — Telephone Encounter (Signed)
Urine culture that was obtained last week is still in the prelim state but is concerning that it may be growing bacteria.  If we do not get started on antibiotics today, we may have to cancel for Thursday.    Unfortunately I do not know which antibiotic is going to be best.  I will just prescribe Bactrim DS twice daily for 7 days starting tonight and hopefully this is appropriate.  Otherwise, we may have to cancel / reschedule if the culture is not sensitive to this antibiotic.  Performing  ureteroscopy in an infected urinary environment can be dangerous.  Vanna Scotland, MD

## 2023-05-22 NOTE — Telephone Encounter (Signed)
Called pt to see if I could help her understand her urine culture.   Pt wanted to know why we waite until 2 days before her surgery to let her know why her urine culture was positive. Explained to patient that the preliminary culture came back today. The results she was referring to is her u/a.   Pt states something is flawed in our system. We need to understand that patiens have lives also. Pt informed me that she has been in pain since 4/24. She wants to feel better. Postponing her surgery will delay her feeling better and inconvenience her family member who is tying to help her after surgery.   Aplogized to patient multiple times. Hoping her ATB will be the correct one. Pt is requesting a call from the office tomorrow  from an MD with an  explanation of how this happened.

## 2023-05-22 NOTE — Telephone Encounter (Signed)
Patient advised. Medication sent in.

## 2023-05-22 NOTE — Telephone Encounter (Signed)
Patient called back stating that she is upset that this is been addressed today and not before. She has someone flying in tonight to be with her and help her post surgery on Thursday. Patient states she will be very upset if she shows up on Thursday and been told her UA is not cleared still and has to cancel the surgery. She had to have someone fly in and does not understand why this took so long to address. Explained to the patient that culture takes up to 5 days to get results and because it is saying preliminary result still we are not sure if this antibiotic is the right one. I advised patient I will check with [facility] to see if we can get finalized result for the patient today.

## 2023-05-23 LAB — CULTURE, URINE COMPREHENSIVE

## 2023-05-23 NOTE — Telephone Encounter (Signed)
Spoke with [facility], stated that culture should take additional 24-48 hours. Spoke with patient, patient aware that culture is still pending, patient still furious, apologized multiple times as the lab has had her specimen for 8 days at this point. Patient made aware that surgery will be pushed to next Thursday 09/12, verbalized understanding of this. OR called and surgery has been posted for next week. Dr. Apolinar Junes also made aware.  Patient informed as soon as we have the culture she will be made aware of next steps.

## 2023-05-23 NOTE — Telephone Encounter (Signed)
Called patient today to let patient know that we still have not received her final culture results. Patient is very upset and does not want to cancel surgery.   Explained multiple times that when performing surgery we have to do that the safest way possible and that includes making sure we are treating the infection appropriately. Did inform patient that I will call [facility] and see if we have an update to when the final culture may be received.

## 2023-05-24 ENCOUNTER — Other Ambulatory Visit: Payer: Self-pay

## 2023-05-24 ENCOUNTER — Encounter: Payer: Self-pay | Admitting: Urology

## 2023-05-24 ENCOUNTER — Ambulatory Visit
Admission: RE | Admit: 2023-05-24 | Discharge: 2023-05-24 | Disposition: A | Discharge status: Home / Self Care | Payer: Medicare Other | Attending: Urology | Admitting: Urology

## 2023-05-24 ENCOUNTER — Ambulatory Visit: Payer: Medicare Other

## 2023-05-24 ENCOUNTER — Ambulatory Visit: Payer: Medicare Other | Admitting: Urgent Care

## 2023-05-24 ENCOUNTER — Encounter
Admission: RE | Disposition: A | Discharge status: Home / Self Care | Payer: Self-pay | Source: Home / Self Care | Attending: Urology

## 2023-05-24 DIAGNOSIS — N201 Calculus of ureter: Secondary | ICD-10-CM | POA: Diagnosis not present

## 2023-05-24 DIAGNOSIS — Z466 Encounter for fitting and adjustment of urinary device: Secondary | ICD-10-CM | POA: Diagnosis not present

## 2023-05-24 DIAGNOSIS — E039 Hypothyroidism, unspecified: Secondary | ICD-10-CM | POA: Diagnosis not present

## 2023-05-24 DIAGNOSIS — I700 Atherosclerosis of aorta: Secondary | ICD-10-CM | POA: Diagnosis not present

## 2023-05-24 DIAGNOSIS — K219 Gastro-esophageal reflux disease without esophagitis: Secondary | ICD-10-CM | POA: Insufficient documentation

## 2023-05-24 DIAGNOSIS — I10 Essential (primary) hypertension: Secondary | ICD-10-CM | POA: Diagnosis not present

## 2023-05-24 DIAGNOSIS — R161 Splenomegaly, not elsewhere classified: Secondary | ICD-10-CM | POA: Diagnosis not present

## 2023-05-24 DIAGNOSIS — Z87442 Personal history of urinary calculi: Secondary | ICD-10-CM | POA: Insufficient documentation

## 2023-05-24 DIAGNOSIS — G4730 Sleep apnea, unspecified: Secondary | ICD-10-CM | POA: Insufficient documentation

## 2023-05-24 DIAGNOSIS — Z48816 Encounter for surgical aftercare following surgery on the genitourinary system: Secondary | ICD-10-CM | POA: Diagnosis not present

## 2023-05-24 DIAGNOSIS — K5730 Diverticulosis of large intestine without perforation or abscess without bleeding: Secondary | ICD-10-CM | POA: Insufficient documentation

## 2023-05-24 DIAGNOSIS — I2510 Atherosclerotic heart disease of native coronary artery without angina pectoris: Secondary | ICD-10-CM | POA: Diagnosis not present

## 2023-05-24 HISTORY — DX: Atherosclerosis of aorta: I70.0

## 2023-05-24 HISTORY — PX: CYSTOSCOPY/URETEROSCOPY/HOLMIUM LASER/STENT PLACEMENT: SHX6546

## 2023-05-24 HISTORY — DX: Atherosclerotic heart disease of native coronary artery without angina pectoris: I25.10

## 2023-05-24 SURGERY — CYSTOSCOPY/URETEROSCOPY/HOLMIUM LASER/STENT PLACEMENT
Anesthesia: General | Laterality: Right

## 2023-05-24 MED ORDER — LIDOCAINE HCL (PF) 2 % IJ SOLN
INTRAMUSCULAR | Status: AC
  Filled 2023-05-24: qty 5

## 2023-05-24 MED ORDER — CHLORHEXIDINE GLUCONATE 0.12 % MT SOLN
OROMUCOSAL | Status: AC
  Filled 2023-05-24: qty 15

## 2023-05-24 MED ORDER — ROCURONIUM BROMIDE 100 MG/10ML IV SOLN
INTRAVENOUS | Status: DC | PRN
  Administered 2023-05-24: 50 mg via INTRAVENOUS

## 2023-05-24 MED ORDER — ORAL CARE MOUTH RINSE: 15.0000 mL | Freq: Once | OROMUCOSAL | Status: AC

## 2023-05-24 MED ORDER — SUGAMMADEX SODIUM 200 MG/2ML IV SOLN
INTRAVENOUS | Status: DC | PRN
  Administered 2023-05-24: 175 mg via INTRAVENOUS

## 2023-05-24 MED ORDER — FENTANYL CITRATE (PF) 100 MCG/2ML IJ SOLN
INTRAMUSCULAR | Status: DC | PRN
  Administered 2023-05-24 (×2): 50 ug via INTRAVENOUS

## 2023-05-24 MED ORDER — GLYCOPYRROLATE 0.2 MG/ML IJ SOLN
INTRAMUSCULAR | Status: DC | PRN
  Administered 2023-05-24: .2 mg via INTRAVENOUS

## 2023-05-24 MED ORDER — SODIUM CHLORIDE 0.9 % IR SOLN
Status: DC | PRN
  Administered 2023-05-24: 1

## 2023-05-24 MED ORDER — FENTANYL CITRATE (PF) 100 MCG/2ML IJ SOLN
25.0000 ug | INTRAMUSCULAR | Status: DC | PRN
  Administered 2023-05-24 (×2): 25 ug via INTRAVENOUS

## 2023-05-24 MED ORDER — CHLORHEXIDINE GLUCONATE 0.12 % MT SOLN
15.0000 mL | Freq: Once | OROMUCOSAL | Status: AC
  Administered 2023-05-24: 15 mL via OROMUCOSAL

## 2023-05-24 MED ORDER — OXYBUTYNIN CHLORIDE 5 MG PO TABS: 5.0000 mg | ORAL_TABLET | Freq: Three times a day (TID) | ORAL | 0 refills | Status: DC | PRN

## 2023-05-24 MED ORDER — PROPOFOL 10 MG/ML IV BOLUS
INTRAVENOUS | Status: AC
  Filled 2023-05-24: qty 20

## 2023-05-24 MED ORDER — DEXAMETHASONE SODIUM PHOSPHATE 10 MG/ML IJ SOLN
INTRAMUSCULAR | Status: AC
  Filled 2023-05-24: qty 1

## 2023-05-24 MED ORDER — HYDROCODONE-ACETAMINOPHEN 5-325 MG PO TABS
1.0000 | ORAL_TABLET | Freq: Four times a day (QID) | ORAL | 0 refills | Status: DC | PRN
Start: 2023-05-24 — End: 2023-05-25

## 2023-05-24 MED ORDER — ONDANSETRON HCL 4 MG/2ML IJ SOLN
4.0000 mg | Freq: Once | INTRAMUSCULAR | Status: AC | PRN
  Administered 2023-05-24: 4 mg via INTRAVENOUS

## 2023-05-24 MED ORDER — CEFAZOLIN SODIUM-DEXTROSE 2-4 GM/100ML-% IV SOLN
2.0000 g | INTRAVENOUS | Status: AC
  Administered 2023-05-24: 2 g via INTRAVENOUS

## 2023-05-24 MED ORDER — IOHEXOL 180 MG/ML  SOLN
INTRAMUSCULAR | Status: DC | PRN
  Administered 2023-05-24: 10 mL

## 2023-05-24 MED ORDER — CEFAZOLIN SODIUM-DEXTROSE 2-4 GM/100ML-% IV SOLN
INTRAVENOUS | Status: AC
  Filled 2023-05-24: qty 100

## 2023-05-24 MED ORDER — FENTANYL CITRATE (PF) 100 MCG/2ML IJ SOLN
INTRAMUSCULAR | Status: AC
  Filled 2023-05-24: qty 2

## 2023-05-24 MED ORDER — LIDOCAINE HCL (CARDIAC) PF 100 MG/5ML IV SOSY
PREFILLED_SYRINGE | INTRAVENOUS | Status: DC | PRN
  Administered 2023-05-24: 50 mg via INTRAVENOUS

## 2023-05-24 MED ORDER — LACTATED RINGERS IV SOLN: INTRAVENOUS | Status: DC

## 2023-05-24 MED ORDER — PROPOFOL 10 MG/ML IV BOLUS
INTRAVENOUS | Status: DC | PRN
  Administered 2023-05-24: 120 mg via INTRAVENOUS

## 2023-05-24 MED ORDER — ONDANSETRON HCL 4 MG/2ML IJ SOLN
INTRAMUSCULAR | Status: DC | PRN
  Administered 2023-05-24: 4 mg via INTRAVENOUS

## 2023-05-24 MED ORDER — ONDANSETRON HCL 4 MG/2ML IJ SOLN
INTRAMUSCULAR | Status: AC
  Filled 2023-05-24: qty 2

## 2023-05-24 MED ORDER — TAMSULOSIN HCL 0.4 MG PO CAPS: 0.4000 mg | ORAL_CAPSULE | Freq: Every day | ORAL | 0 refills | Status: DC

## 2023-05-24 MED ORDER — GLYCOPYRROLATE 0.2 MG/ML IJ SOLN
INTRAMUSCULAR | Status: AC
  Filled 2023-05-24: qty 1

## 2023-05-24 MED ORDER — DEXAMETHASONE SODIUM PHOSPHATE 10 MG/ML IJ SOLN
INTRAMUSCULAR | Status: DC | PRN
  Administered 2023-05-24: 10 mg via INTRAVENOUS

## 2023-05-24 SURGICAL SUPPLY — 24 items
ADH LQ OCL WTPRF AMP STRL LF (MISCELLANEOUS) ×1
ADHESIVE MASTISOL STRL (MISCELLANEOUS) IMPLANT
BAG DRAIN SIEMENS DORNER NS (MISCELLANEOUS) ×1 IMPLANT
BAG DRN NS LF (MISCELLANEOUS) ×1
BASKET ZERO TIP 1.9FR (BASKET) IMPLANT
BSKT STON RTRVL ZERO TP 1.9FR (BASKET)
CATH URET FLEX-TIP 2 LUMEN 10F (CATHETERS) IMPLANT
CATH URETL OPEN 5X70 (CATHETERS) ×1 IMPLANT
CNTNR URN SCR LID CUP LEK RST (MISCELLANEOUS) IMPLANT
CONT SPEC 4OZ STRL OR WHT (MISCELLANEOUS)
DRSG TEGADERM 2-3/8X2-3/4 SM (GAUZE/BANDAGES/DRESSINGS) IMPLANT
FIBER LASER MOSES 200 DFL (Laser) IMPLANT
GLOVE BIO SURGEON STRL SZ 6.5 (GLOVE) ×1 IMPLANT
GOWN STRL REUS W/ TWL LRG LVL3 (GOWN DISPOSABLE) ×2 IMPLANT
GOWN STRL REUS W/TWL LRG LVL3 (GOWN DISPOSABLE) ×2
GUIDEWIRE GREEN .038 145CM (MISCELLANEOUS) IMPLANT
GUIDEWIRE STR DUAL SENSOR (WIRE) ×1 IMPLANT
IV NS IRRIG 3000ML ARTHROMATIC (IV SOLUTION) ×1 IMPLANT
KIT TURNOVER CYSTO (KITS) ×1 IMPLANT
PACK CYSTO AR (MISCELLANEOUS) ×1 IMPLANT
SET CYSTO W/LG BORE CLAMP LF (SET/KITS/TRAYS/PACK) ×1 IMPLANT
STENT URET 6FRX22 CONTOUR (STENTS) IMPLANT
SURGILUBE 2OZ TUBE FLIPTOP (MISCELLANEOUS) ×1 IMPLANT
WATER STERILE IRR 500ML POUR (IV SOLUTION) ×1 IMPLANT

## 2023-05-24 NOTE — Transfer of Care (Signed)
Immediate Anesthesia Transfer of Care Note  Patient: Jennifer Patrick  Procedure(s) Performed: CYSTOSCOPY/URETEROSCOPY/HOLMIUM LASER/STENT PLACEMENT (Right)  Patient Location: PACU  Anesthesia Type:General  Level of Consciousness: drowsy  Airway & Oxygen Therapy: Patient Spontanous Breathing and Patient connected to face mask oxygen  Post-op Assessment: Report given to RN and Post -op Vital signs reviewed and stable  Post vital signs: Reviewed  Last Vitals:  Vitals Value Taken Time  BP 122/67 05/24/23 1224  Temp    Pulse 65 05/24/23 1225  Resp 14 05/24/23 1225  SpO2 96 05/24/23 1225  Vitals shown include unfiled device data.  Last Pain:  Vitals:   05/24/23 1051  TempSrc: Oral  PainSc: 0-No pain         Complications: No notable events documented.

## 2023-05-24 NOTE — Op Note (Signed)
Date of procedure: 05/24/23  Preoperative diagnosis:  Right UPJ stone Hematuria  Postoperative diagnosis:  Same as above  Procedure: Right ureteroscopy Laser lithotripsy Right ureteral stent placement Retrograde pyelogram Interpretation of fluoroscopy less than 30 minutes  Surgeon: Vanna Scotland, MD  Anesthesia: General  Complications: None  Intraoperative findings: 1.1 cm right UPJ stone treated.  Uncomplicated stent left on tether.   EBL: Minimal  Specimens: None  Drains: 622 French double-J ureteral stent on right with tether  Indication: Jennifer Patrick is a 72 y.o. patient with gross hematuria ultimately found to have right renal pelvic stone likely the etiology.  Preoperative urine culture was positive, she has been on Bactrim for 2 days and is asymptomatic otherwise.  After reviewing the management options for treatment,   elected to proceed with the above surgical procedure(s). We have discussed the potential benefits and risks of the procedure, side effects of the proposed treatment, the likelihood of the patient achieving the goals of the procedure, and any potential problems that might occur during the procedure or recuperation. Informed consent has been obtained.  Description of procedure:  The patient was taken to the operating room and general anesthesia was induced.  The patient was placed in the dorsal lithotomy position, prepped and draped in the usual sterile fashion, and preoperative antibiotics were administered. A preoperative time-out was performed.   A 21 French scope was advanced per urethra into the bladder.  The bladder was inspected.  Other than some subtle erythema nonspecific, there was no tumors, masses or lesions.  Attention was turned to the right ureteral orifice.This was then cannulated using a sensor wire up to the level of the kidney.  A dual-lumen ureteral access sheath was used just within the distal ureter. A Super Stiff wire was then  introduced all the way up to the level of the kidney through the second lumen of the catheter.  On scout imaging, the stone could be easily seen in the renal pelvis.  The sensor wire was snapped in place as a safety wire.  A ureteral access sheath, 10/12 [place] was advanced  to the level of the proximal ureter.  A dual-lumen digital ureteroscope was then advanced up to the level of the stone.  A 200 m laser fiber was then brought in using dusting settings of 0.3 J and 80 Hz, the stone was dusted.  All fragments were removed.   At the end of the procedure, no significant residual stone fragment remained greater than the size of the tip of the laser fiber.  A final retrograde pyelogram created roadmap to ensure that each every calyx was visualized and all significant stone burden was addressed.  There was no contrast extravasation.  The scope was then backed down the length of the ureter inspecting the ureteral integrity along the way.  There was no residual stone burden or significant ureteral injuries appreciated.  Finally, a 6 by 22 French ureteral access sheath was advanced over the safety wire up to the level of the kidney.  Upon wire removal, there was a full coil noted both within the renal pelvis as well as within the bladder.  The stent string was left at fixed to the distal coil of the stent and secured to the patient's using Mastisol and Tegaderm.  The patient was then cleaned and dried, repositioned in supine position, reversed of anesthesia, taken to the PACU in stable condition.  Plan: Maintain stent for 1 week.  Follow-up in 4 to 6 weeks  with renal ultrasound prior.  Vanna Scotland, M.D.

## 2023-05-24 NOTE — Discharge Instructions (Addendum)
You have a ureteral stent in place.  This is a tube that extends from your kidney to your bladder.  This may cause urinary bleeding, burning with urination, and urinary frequency.  Please call our office or present to the ED if you develop fevers >101 or pain which is not able to be controlled with oral pain medications.  You may be given either Flomax and/ or ditropan to help with bladder spasms and stent pain in addition to pain medications.    You have a stent in place.  It is attached to a string taped to your left inner thigh.  When you urinate, please pat gently and avoid dislodging the string.  Take assume care when showering or bathing.  Please try to keep the stent in place for a week as there was numerous small fragments that need to pass.  If the stent is dislodged prematurely, please let us know but this is not an emergency.  I would like you to try to keep the stent until next Thursday morning.  On Thursday morning you may untape the stent string and pull gently until the entire stent is removed.  If you have any difficulties doing this, please contact our office.  [facility] [address] [place], [place] [address] [phone]    AMBULATORY SURGERY  DISCHARGE INSTRUCTIONS   The drugs that you were given will stay in your system until tomorrow so for the next 24 hours you should not:  Drive an automobile Make any legal decisions Drink any alcoholic beverage   You may resume regular meals tomorrow.  Today it is better to start with liquids and gradually work up to solid foods.  You may eat anything you prefer, but it is better to start with liquids, then soup and crackers, and gradually work up to solid foods.   Please notify your doctor immediately if you have any unusual bleeding, trouble breathing, redness and pain at the surgery site, drainage, fever, or pain not relieved by medication.    Additional  Instructions:        Please contact your physician with any problems or Same Day Surgery at [phone], Monday through Friday 6 am to 4 pm, or [place] at [facility] number at [phone].

## 2023-05-24 NOTE — Anesthesia Procedure Notes (Signed)
Procedure Name: Intubation Date/Time: 05/24/2023 11:50 AM  Performed by: Mathews Argyle, CRNAPre-anesthesia Checklist: Patient identified, Patient being monitored, Timeout performed, Emergency Drugs available and Suction available Patient Re-evaluated:Patient Re-evaluated prior to induction Oxygen Delivery Method: Circle system utilized Preoxygenation: Pre-oxygenation with 100% oxygen Induction Type: IV induction Ventilation: Mask ventilation without difficulty Laryngoscope Size: Mac and 3 Grade View: Grade I Tube type: Oral Tube size: 7.0 mm Number of attempts: 1 Airway Equipment and Method: Stylet Placement Confirmation: ETT inserted through vocal cords under direct vision, positive ETCO2 and breath sounds checked- equal and bilateral Secured at: 21 cm Tube secured with: Tape Dental Injury: Teeth and Oropharynx as per pre-operative assessment

## 2023-05-24 NOTE — Anesthesia Preprocedure Evaluation (Signed)
Anesthesia Evaluation  Patient identified by MRN, date of birth, ID band Patient awake    Reviewed: Allergy & Precautions, H&P , NPO status , Patient's Chart, lab work & pertinent test results, reviewed documented beta blocker date and time   History of Anesthesia Complications Negative for: history of anesthetic complications  Airway Mallampati: II  TM Distance: >3 FB Neck ROM: full    Dental  (+) Dental Advidsory Given, Caps, Missing, Teeth Intact   Pulmonary neg shortness of breath, sleep apnea , neg COPD, neg recent URI   Pulmonary exam normal breath sounds clear to auscultation       Cardiovascular Exercise Tolerance: Good hypertension, (-) angina + CAD  (-) Past MI and (-) Cardiac Stents Normal cardiovascular exam(-) dysrhythmias (-) Valvular Problems/Murmurs Rhythm:regular Rate:Normal     Neuro/Psych  PSYCHIATRIC DISORDERS Anxiety Depression    negative neurological ROS     GI/Hepatic Neg liver ROS,GERD  ,,  Endo/Other  neg diabetesHypothyroidism    Renal/GU negative Renal ROS  negative genitourinary   Musculoskeletal   Abdominal   Peds  Hematology negative hematology ROS (+)   Anesthesia Other Findings Past Medical History: No date: Anxiety No date: Arthritis No date: Cataract No date: GERD (gastroesophageal reflux disease) No date: Hypertension No date: Hypothyroidism No date: Psoriasis     Comment:  Dr. Gwen Pounds, on embrel No date: Sleep apnea No date: Wears partial dentures     Comment:  upper   Reproductive/Obstetrics negative OB ROS                             Anesthesia Physical Anesthesia Plan  ASA: 3  Anesthesia Plan: General   Post-op Pain Management:    Induction: Intravenous  PONV Risk Score and Plan: 3 and Ondansetron, Dexamethasone and Treatment may vary due to age or medical condition  Airway Management Planned: LMA and Oral ETT  Additional  Equipment:   Intra-op Plan:   Post-operative Plan: Extubation in OR  Informed Consent: I have reviewed the patients History and Physical, chart, labs and discussed the procedure including the risks, benefits and alternatives for the proposed anesthesia with the patient or authorized representative who has indicated his/her understanding and acceptance.     Dental Advisory Given  Plan Discussed with: Anesthesiologist, CRNA and Surgeon  Anesthesia Plan Comments:         Anesthesia Quick Evaluation

## 2023-05-24 NOTE — Interval H&P Note (Signed)
History and Physical Interval Note:  05/24/2023 10:12 AM  Jennifer Patrick  has presented today for surgery, with the diagnosis of Right Ureteropelvic Junction Stone.  The various methods of treatment have been discussed with the patient and family. After consideration of risks, benefits and other options for treatment, the patient has consented to  Procedure(s): CYSTOSCOPY/URETEROSCOPY/HOLMIUM LASER/STENT PLACEMENT (Right) as a surgical intervention.  The patient's history has been reviewed, patient examined, no change in status, stable for surgery.  I have reviewed the patient's chart and labs.  Questions were answered to the patient's satisfaction.    RRR CTAB  +preop UCx, currently on Bactrim since Tuesday and asymptomatic  Vanna Scotland

## 2023-05-24 NOTE — Anesthesia Postprocedure Evaluation (Signed)
Anesthesia Post Note  Patient: Jennifer Patrick  Procedure(s) Performed: CYSTOSCOPY/URETEROSCOPY/HOLMIUM LASER/STENT PLACEMENT (Right)  Patient location during evaluation: PACU Anesthesia Type: General Level of consciousness: awake and alert Pain management: pain level controlled Vital Signs Assessment: post-procedure vital signs reviewed and stable Respiratory status: spontaneous breathing, nonlabored ventilation, respiratory function stable and patient connected to nasal cannula oxygen Cardiovascular status: blood pressure returned to baseline and stable Postop Assessment: no apparent nausea or vomiting Anesthetic complications: no   No notable events documented.   Last Vitals:  Vitals:   05/24/23 1315 05/24/23 1333  BP: 132/76 (!) 152/75  Pulse: 63 67  Resp: 14 20  Temp: (!) 36.2 C 36.6 C  SpO2: 92%     Last Pain:  Vitals:   05/24/23 1333  TempSrc: Oral  PainSc: 1                  Lenard Simmer

## 2023-05-25 ENCOUNTER — Encounter: Payer: Self-pay | Admitting: Urology

## 2023-05-25 ENCOUNTER — Other Ambulatory Visit: Payer: Self-pay

## 2023-05-25 DIAGNOSIS — N200 Calculus of kidney: Secondary | ICD-10-CM

## 2023-05-25 MED ORDER — HYDROCODONE-ACETAMINOPHEN 5-325 MG PO TABS: 1.0000 | ORAL_TABLET | Freq: Four times a day (QID) | ORAL | 0 refills | Status: DC | PRN

## 2023-05-25 NOTE — Telephone Encounter (Signed)
error 

## 2023-05-25 NOTE — Telephone Encounter (Signed)
Medication and pharmacy pulled down for review

## 2023-05-26 ENCOUNTER — Other Ambulatory Visit: Payer: Self-pay | Admitting: Family

## 2023-05-26 DIAGNOSIS — I10 Essential (primary) hypertension: Secondary | ICD-10-CM

## 2023-05-28 NOTE — Group Note (Deleted)

## 2023-05-30 ENCOUNTER — Encounter: Payer: Self-pay | Admitting: Urology

## 2023-05-31 ENCOUNTER — Ambulatory Visit (INDEPENDENT_AMBULATORY_CARE_PROVIDER_SITE_OTHER): Payer: Medicare Other | Admitting: Physician Assistant

## 2023-05-31 VITALS — BP 167/79 | HR 76 | Temp 98.0°F | Ht 63.0 in | Wt 185.0 lb

## 2023-05-31 DIAGNOSIS — N200 Calculus of kidney: Secondary | ICD-10-CM | POA: Diagnosis not present

## 2023-05-31 LAB — MICROSCOPIC EXAMINATION
Epithelial Cells (non renal): >10 /HPF — AB (ref 0–10)
RBC, Urine: >30 /HPF — AB (ref 0–2)

## 2023-05-31 LAB — URINALYSIS, COMPLETE
Bilirubin, UA: NEGATIVE
Glucose, UA: NEGATIVE
Ketones, UA: NEGATIVE
Nitrite, UA: NEGATIVE
Specific Gravity, UA: >1.03 — ABNORMAL HIGH (ref 1.005–1.030)
Urobilinogen, Ur: 0.2 mg/dL (ref 0.2–1.0)
pH, UA: 5.5 (ref 5.0–7.5)

## 2023-05-31 MED ORDER — KETOROLAC TROMETHAMINE 60 MG/2ML IM SOLN
15.0000 mg | Freq: Once | INTRAMUSCULAR | Status: AC
Start: 2023-05-31 — End: 2023-05-31
  Administered 2023-05-31: 15 mg via INTRAMUSCULAR

## 2023-05-31 MED ORDER — HYDROCODONE-ACETAMINOPHEN 5-325 MG PO TABS: 1.0000 | ORAL_TABLET | Freq: Four times a day (QID) | ORAL | 0 refills | Status: DC | PRN

## 2023-05-31 MED ORDER — SULFAMETHOXAZOLE-TRIMETHOPRIM 800-160 MG PO TABS: 1.0000 | ORAL_TABLET | Freq: Two times a day (BID) | ORAL | 0 refills | Status: AC

## 2023-05-31 MED ORDER — ONDANSETRON 4 MG PO TBDP
4.0000 mg | ORAL_TABLET | Freq: Three times a day (TID) | ORAL | 0 refills | Status: DC | PRN
Start: 2023-05-31 — End: 2023-07-25

## 2023-05-31 NOTE — Telephone Encounter (Signed)
Sent [facility] a message about this and forwarding to [facility] also

## 2023-05-31 NOTE — Progress Notes (Signed)
IM Injection  Patient is present today for an IM Injection for treatment of ketorolac Drug: ketorolac Dose: 30 mg (1 ml) Location:15 mg LUOQ, 15 mg RUOQ Lot: [id number] Exp:04/18/2024 Patient tolerated well, no complications were noted  Performed by: Vela Prose

## 2023-05-31 NOTE — Progress Notes (Signed)
05/31/2023 4:10 PM   Jennifer Patrick [birth year] [id number]  CC: Chief Complaint  Patient presents with   Follow-up   HPI: Jennifer Patrick is a 72 y.o. female who underwent right ureteroscopy with laser lithotripsy and stent placement with Dr. Apolinar Junes 7 days ago for management of a 1.1 cm right UPJ stone who presents today for evaluation of possible UTI.   Her stent came partially out 2 days ago, so she subsequently removed it.  Postop plan had been to keep it for 7 days.  After stent removal, she developed right flank pain.  She had been having weakness, so Dr. Apolinar Junes recommended stopping Flomax, which helped considerably.  Around midday today, her pain increased in severity, now severe, and she had 2 episodes of vomiting.   She denies a history of bleeding gastric ulcers or stomach surgery.  She denies fevers.  In-office UA today positive for 3+ blood, 3+ protein, and trace leukocytes; urine microscopy with 11-30 WBCs/HPF, >30 RBCs/HPF, >10 epithelial cells/hpf, and many bacteria.   PMH: Past Medical History:  Diagnosis Date   Anxiety    Aortic atherosclerosis (HCC)    Arthritis    Cataract    Coronary artery calcification seen on CT scan    GERD (gastroesophageal reflux disease)    History of kidney stones    Hypertension    Hypothyroidism    Psoriasis    Dr. Gwen Pounds, on embrel   Sleep apnea    Wears partial dentures    upper    Surgical History: Past Surgical History:  Procedure Laterality Date   ABDOMINAL HYSTERECTOMY  1999   for abnormal pap, TVH. No ovaries or cervix.    BRAVO PH STUDY N/A 02/02/2022   Procedure: BRAVO PH STUDY;  Surgeon: Midge Minium, MD;  Location: [facility] ENDOSCOPY;  Service: Endoscopy;  Laterality: N/A;   BREAST EXCISIONAL BIOPSY Left 1981   benign   CATARACT EXTRACTION Bilateral    CHOLECYSTECTOMY     COLONOSCOPY WITH PROPOFOL N/A 12/27/2020   Procedure: COLONOSCOPY WITH PROPOFOL;  Surgeon: Midge Minium, MD;  Location: [facility]  SURGERY CNTR;  Service: Endoscopy;  Laterality: N/A;   CYSTOSCOPY/URETEROSCOPY/HOLMIUM LASER/STENT PLACEMENT Right 05/24/2023   Procedure: CYSTOSCOPY/URETEROSCOPY/HOLMIUM LASER/STENT PLACEMENT;  Surgeon: Vanna Scotland, MD;  Location: [facility] ORS;  Service: Urology;  Laterality: Right;   ESOPHAGOGASTRODUODENOSCOPY N/A 02/02/2022   Procedure: ESOPHAGOGASTRODUODENOSCOPY (EGD);  Surgeon: Midge Minium, MD;  Location: [facility] ENDOSCOPY;  Service: Endoscopy;  Laterality: N/A;   THYROIDECTOMY  1976   VITRECTOMY Right 2014    Home Medications:  Allergies as of 05/31/2023       Reactions   Penicillins    rash        Medication List        Accurate as of May 31, 2023  4:10 PM. If you have any questions, ask your nurse or doctor.          amLODipine 5 MG tablet Commonly known as: NORVASC TAKE 1 TABLET (5 MG TOTAL) BY MOUTH DAILY.   atenolol 25 MG tablet Commonly known as: TENORMIN TAKE 1 TABLET BY MOUTH EVERY DAY   HYDROcodone-acetaminophen 5-325 MG tablet Commonly known as: NORCO/VICODIN Take 1-2 tablets by mouth every 6 (six) hours as needed for moderate pain.   levothyroxine 125 MCG tablet Commonly known as: SYNTHROID TAKE 1 TABLET BY MOUTH EVERY DAY   omeprazole 40 MG capsule Commonly known as: PRILOSEC TAKE 1 CAPSULE (40 MG TOTAL) BY MOUTH DAILY.   oxybutynin 5 MG tablet Commonly  known as: DITROPAN Take 1 tablet (5 mg total) by mouth every 8 (eight) hours as needed for bladder spasms.   PRESERVISION AREDS 2 PO Take 1 tablet by mouth daily.   sertraline 50 MG tablet Commonly known as: ZOLOFT TAKE 1 TABLET BY MOUTH EVERY DAY   SIMPONI ARIA IV Infusion every 8 weeks   sulfamethoxazole-trimethoprim 800-160 MG tablet Commonly known as: BACTRIM DS Take 1 tablet by mouth 2 (two) times daily.   tamsulosin 0.4 MG Caps capsule Commonly known as: Flomax Take 1 capsule (0.4 mg total) by mouth daily.        Allergies:  Allergies  Allergen Reactions    Penicillins     rash    Family History: Family History  Problem Relation Age of Onset   Breast cancer Mother 30   Lung cancer Mother    Macular degeneration Mother    Sudden death Father 53       committed suicide   Diabetes Father    Lung disease Sister        legionaires   Hepatitis C Brother    Colon cancer Neg Hx     Social History:   reports that she has never smoked. She has never used smokeless tobacco. She reports current alcohol use of about 4.0 standard drinks of alcohol per week. She reports that she does not use drugs.  Physical Exam: BP (!) 167/79   Pulse 76   Temp 98 F (36.7 C)   Ht 5\' 3"  (1.6 m)   Wt 185 lb (83.9 kg)   BMI 32.77 kg/m   Constitutional:  Alert and oriented, no acute distress, nontoxic appearing HEENT: [place], AT Cardiovascular: No clubbing, cyanosis, or edema Respiratory: Normal respiratory effort, no increased work of breathing Skin: No rashes, bruises or suspicious lesions Neurologic: Grossly intact, no focal deficits, moving all 4 extremities Psychiatric: Normal mood and affect  Laboratory Data: Results for orders placed or performed in visit on 05/31/23  Microscopic Examination   Urine  Result Value Ref Range   WBC, UA 11-30 (A) 0 - 5 /hpf   RBC, Urine >30 (A) 0 - 2 /hpf   Epithelial Cells (non renal) >10 (A) 0 - 10 /hpf   Mucus, UA Present (A) Not Estab.   Bacteria, UA Many (A) None seen/Few  Urinalysis, Complete  Result Value Ref Range   Specific Gravity, UA >1.030 (H) 1.005 - 1.030   pH, UA 5.5 5.0 - 7.5   Color, UA Yellow Yellow   Appearance Ur Cloudy (A) Clear   Leukocytes,UA Trace (A) Negative   Protein,UA 3+ (A) Negative/Trace   Glucose, UA Negative Negative   Ketones, UA Negative Negative   RBC, UA 3+ (A) Negative   Bilirubin, UA Negative Negative   Urobilinogen, Ur 0.2 0.2 - 1.0 mg/dL   Nitrite, UA Negative Negative   Microscopic Examination See below:    Assessment & Plan:   1. Right kidney stone Right flank  pain and vomiting after having to remove her stent 2 days early.  UA is suspicious for infection, though contaminated and also consistent with her postop status.  That said, I think it would be appropriate to treat with antibiotics out of an abundance of caution, though overall my suspicion for infection is rather low.  I offered her CT stone study, though was honest with her that I think her issue is postop ureteral edema leading to hydronephrosis.  I think a clinically significant retained fragment is less likely.  Using shared decision-making, we elected to proceed with IM Toradol in clinic today.  If her symptoms do not improve by the morning, she will reach out to me via MyChart and we will pursue a stat CT.  I am also prescribing Zofran and Norco for symptom control.  She is in agreement with this plan.  Notably, we gave her 2 separate 15 mg Toradol injections for total of 30 mg administered during her visit.  She tolerated this well and her pain was significantly relieved. - Urinalysis, Complete - CULTURE, URINE COMPREHENSIVE - ketorolac (TORADOL) injection 15 mg - ondansetron (ZOFRAN-ODT) 4 MG disintegrating tablet; Take 1 tablet (4 mg total) by mouth every 8 (eight) hours as needed for nausea or vomiting.  Dispense: 20 tablet; Refill: 0 - sulfamethoxazole-trimethoprim (BACTRIM DS) 800-160 MG tablet; Take 1 tablet by mouth 2 (two) times daily for 7 days.  Dispense: 14 tablet; Refill: 0 - HYDROcodone-acetaminophen (NORCO/VICODIN) 5-325 MG tablet; Take 1-2 tablets by mouth every 6 (six) hours as needed for moderate pain.  Dispense: 10 tablet; Refill: 0 - Microscopic Examination - ketorolac (TORADOL) injection 15 mg   Return if symptoms worsen or fail to improve.  Carman Ching, PA-C  [facility] [place] [address] [place], [place] [address] [phone]

## 2023-06-03 LAB — CULTURE, URINE COMPREHENSIVE

## 2023-06-05 ENCOUNTER — Telehealth: Payer: Self-pay | Admitting: Family

## 2023-06-05 NOTE — Telephone Encounter (Signed)
Copied from CRM [id number]. Topic: Medicare AWV >> Jun 05, 2023  3:22 PM Payton Doughty wrote: Reason for CRM: LM 06/05/2023 to schedule AWV   Verlee Rossetti; Care Guide Ambulatory Clinical Support [place] [facility] Direct Dial: [phone]

## 2023-06-27 DIAGNOSIS — Z6832 Body mass index (BMI) 32.0-32.9, adult: Secondary | ICD-10-CM | POA: Diagnosis not present

## 2023-06-27 DIAGNOSIS — I10 Essential (primary) hypertension: Secondary | ICD-10-CM | POA: Diagnosis not present

## 2023-06-27 DIAGNOSIS — L409 Psoriasis, unspecified: Secondary | ICD-10-CM | POA: Diagnosis not present

## 2023-07-02 ENCOUNTER — Telehealth: Payer: Self-pay | Admitting: Family

## 2023-07-02 NOTE — Telephone Encounter (Signed)
Copied from CRM [id number]. Topic: Medicare AWV >> Jul 02, 2023 11:00 AM Payton Doughty wrote: Reason for CRM: Called LVM 07/02/2023 to schedule Annual Wellness Visit  Verlee Rossetti; Care Guide Ambulatory Clinical Support [place] [facility] Direct Dial: [phone]

## 2023-07-04 ENCOUNTER — Ambulatory Visit: Payer: Medicare Other | Admitting: Urology

## 2023-07-23 ENCOUNTER — Ambulatory Visit
Admission: RE | Admit: 2023-07-23 | Discharge: 2023-07-23 | Disposition: A | Discharge status: Home / Self Care | Payer: Medicare Other | Source: Ambulatory Visit | Attending: Urology | Admitting: Urology

## 2023-07-23 DIAGNOSIS — N281 Cyst of kidney, acquired: Secondary | ICD-10-CM | POA: Diagnosis not present

## 2023-07-23 DIAGNOSIS — N201 Calculus of ureter: Secondary | ICD-10-CM | POA: Diagnosis not present

## 2023-07-25 ENCOUNTER — Ambulatory Visit (INDEPENDENT_AMBULATORY_CARE_PROVIDER_SITE_OTHER): Payer: Medicare Other | Admitting: Urology

## 2023-07-25 ENCOUNTER — Encounter: Payer: Self-pay | Admitting: Urology

## 2023-07-25 ENCOUNTER — Ambulatory Visit: Payer: Medicare Other | Admitting: Family

## 2023-07-25 ENCOUNTER — Encounter: Payer: Self-pay | Admitting: Family

## 2023-07-25 ENCOUNTER — Ambulatory Visit: Payer: Medicare Other

## 2023-07-25 VITALS — BP 135/75 | HR 73

## 2023-07-25 VITALS — BP 130/78 | HR 65 | Temp 97.8°F | Ht 63.0 in | Wt 179.4 lb

## 2023-07-25 DIAGNOSIS — M7989 Other specified soft tissue disorders: Secondary | ICD-10-CM | POA: Diagnosis not present

## 2023-07-25 DIAGNOSIS — E559 Vitamin D deficiency, unspecified: Secondary | ICD-10-CM | POA: Diagnosis not present

## 2023-07-25 DIAGNOSIS — Z87898 Personal history of other specified conditions: Secondary | ICD-10-CM

## 2023-07-25 DIAGNOSIS — R7309 Other abnormal glucose: Secondary | ICD-10-CM

## 2023-07-25 DIAGNOSIS — N200 Calculus of kidney: Secondary | ICD-10-CM | POA: Diagnosis not present

## 2023-07-25 DIAGNOSIS — L409 Psoriasis, unspecified: Secondary | ICD-10-CM

## 2023-07-25 DIAGNOSIS — I700 Atherosclerosis of aorta: Secondary | ICD-10-CM | POA: Diagnosis not present

## 2023-07-25 DIAGNOSIS — I10 Essential (primary) hypertension: Secondary | ICD-10-CM

## 2023-07-25 DIAGNOSIS — L400 Psoriasis vulgaris: Secondary | ICD-10-CM | POA: Diagnosis not present

## 2023-07-25 DIAGNOSIS — L821 Other seborrheic keratosis: Secondary | ICD-10-CM | POA: Diagnosis not present

## 2023-07-25 DIAGNOSIS — E039 Hypothyroidism, unspecified: Secondary | ICD-10-CM | POA: Diagnosis not present

## 2023-07-25 DIAGNOSIS — M79674 Pain in right toe(s): Secondary | ICD-10-CM | POA: Diagnosis not present

## 2023-07-25 DIAGNOSIS — M25572 Pain in left ankle and joints of left foot: Secondary | ICD-10-CM

## 2023-07-25 DIAGNOSIS — Z8639 Personal history of other endocrine, nutritional and metabolic disease: Secondary | ICD-10-CM | POA: Diagnosis not present

## 2023-07-25 LAB — COMPREHENSIVE METABOLIC PANEL WITH GFR
ALT: 14 U/L (ref 0–35)
AST: 22 U/L (ref 0–37)
Albumin: 3.9 g/dL (ref 3.5–5.2)
Alkaline Phosphatase: 104 U/L (ref 39–117)
BUN: 12 mg/dL (ref 6–23)
CO2: 27 meq/L (ref 19–32)
Calcium: 9 mg/dL (ref 8.4–10.5)
Chloride: 104 meq/L (ref 96–112)
Creatinine, Ser: 0.79 mg/dL (ref 0.40–1.20)
GFR: 73.56 mL/min
Glucose, Bld: 95 mg/dL (ref 70–99)
Potassium: 3.8 meq/L (ref 3.5–5.1)
Sodium: 140 meq/L (ref 135–145)
Total Bilirubin: 0.7 mg/dL (ref 0.2–1.2)
Total Protein: 6.8 g/dL (ref 6.0–8.3)

## 2023-07-25 LAB — LIPID PANEL
Cholesterol: 182 mg/dL (ref 0–200)
HDL: 41.3 mg/dL
LDL Cholesterol: 115 mg/dL — ABNORMAL HIGH (ref 0–99)
NonHDL: 140.73
Total CHOL/HDL Ratio: 4
Triglycerides: 127 mg/dL (ref 0.0–149.0)
VLDL: 25.4 mg/dL (ref 0.0–40.0)

## 2023-07-25 LAB — SEDIMENTATION RATE: Sed Rate: 51 mm/h — ABNORMAL HIGH (ref 0–30)

## 2023-07-25 LAB — URIC ACID: Uric Acid, Serum: 5.8 mg/dL (ref 2.4–7.0)

## 2023-07-25 LAB — HEMOGLOBIN A1C: Hgb A1c MFr Bld: 5.8 % (ref 4.6–6.5)

## 2023-07-25 LAB — VITAMIN D 25 HYDROXY (VIT D DEFICIENCY, FRACTURES): VITD: 20.33 ng/mL — ABNORMAL LOW (ref 30.00–100.00)

## 2023-07-25 LAB — C-REACTIVE PROTEIN: CRP: <1 mg/dL (ref 0.5–20.0)

## 2023-07-25 LAB — TSH: TSH: 17.31 u[IU]/mL — ABNORMAL HIGH (ref 0.35–5.50)

## 2023-07-25 MED ORDER — PREDNISONE 10 MG PO TABS
ORAL_TABLET | ORAL | 0 refills | Status: DC
Start: 2023-07-25 — End: 2024-01-07

## 2023-07-25 MED ORDER — AMLODIPINE BESYLATE 10 MG PO TABS: 10.0000 mg | ORAL_TABLET | Freq: Every day | ORAL | 3 refills | Status: DC

## 2023-07-25 NOTE — Progress Notes (Signed)
Jennifer Patrick,acting as a scribe for Vanna Scotland, MD.,have documented all relevant documentation on the behalf of Vanna Scotland, MD,as directed by  Vanna Scotland, MD while in the presence of Vanna Scotland, MD.  07/25/2023 11:36 AM   Jennifer Patrick 06/17/1951 [id number]  Referring provider: Allegra Grana, FNP [address] [place],  [place] [address]  Chief Complaint  Patient presents with   Follow-up    HPI: 72 year-old female who presents today for follow-up.   She underwent right ureteroscopy, laser lithotripsy and stent placement for a 1.1 cm right UPJ stone on 05/24/2023. Her stent came out prematurely postoperatively. She developed dysuria and flank pain. She was treated empirically for possible infection although a urine culture was negative.   She follows up today with a renal ultrasound. Unfortunately, it has not been interpreted by the radiologist. I am personally reviewing it today. Details are below.   She reports intermittent back pain, which is not constant and is sometimes triggered by movement, such as stepping or getting out of a car. This is her first kidney stone, although there is a family history of stones. She acknowledges not drinking enough water and has been advised to increase fluid intake to prevent future stones.   She also has a history of gout flares over the past two years and is currently experiencing a flare. The primary care physician has been consulted regarding the potential correlation between gout and kidney stones.   She is also managing psoriatic arthritis with infusions, which have been paused due to the kidney stone issue, leading to a flare in psoriasis symptoms.  PMH: Past Medical History:  Diagnosis Date   Anxiety    Aortic atherosclerosis (HCC)    Arthritis    Cataract    Coronary artery calcification seen on CT scan    GERD (gastroesophageal reflux disease)    History of kidney stones    Hypertension     Hypothyroidism    Psoriasis    Dr. Gwen Pounds, on embrel   Sleep apnea    Wears partial dentures    upper    Surgical History: Past Surgical History:  Procedure Laterality Date   ABDOMINAL HYSTERECTOMY  1999   for abnormal pap, TVH. No ovaries or cervix.    BRAVO PH STUDY N/A 02/02/2022   Procedure: BRAVO PH STUDY;  Surgeon: Midge Minium, MD;  Location: [facility] ENDOSCOPY;  Service: Endoscopy;  Laterality: N/A;   BREAST EXCISIONAL BIOPSY Left 1981   benign   CATARACT EXTRACTION Bilateral    CHOLECYSTECTOMY     COLONOSCOPY WITH PROPOFOL N/A 12/27/2020   Procedure: COLONOSCOPY WITH PROPOFOL;  Surgeon: Midge Minium, MD;  Location: [facility] SURGERY [facility]TR;  Service: Endoscopy;  Laterality: N/A;   CYSTOSCOPY/URETEROSCOPY/HOLMIUM LASER/STENT PLACEMENT Right 05/24/2023   Procedure: CYSTOSCOPY/URETEROSCOPY/HOLMIUM LASER/STENT PLACEMENT;  Surgeon: Vanna Scotland, MD;  Location: [facility] ORS;  Service: Urology;  Laterality: Right;   ESOPHAGOGASTRODUODENOSCOPY N/A 02/02/2022   Procedure: ESOPHAGOGASTRODUODENOSCOPY (EGD);  Surgeon: Midge Minium, MD;  Location: [facility] ENDOSCOPY;  Service: Endoscopy;  Laterality: N/A;   THYROIDECTOMY  1976   VITRECTOMY Right 2014    Home Medications:  Allergies as of 07/25/2023       Reactions   Penicillins    rash        Medication List        Accurate as of July 25, 2023 11:36 AM. If you have any questions, ask your nurse or doctor.  STOP taking these medications    atenolol 25 MG tablet Commonly known as: TENORMIN Stopped by: Rennie Plowman   HYDROcodone-acetaminophen 5-325 MG tablet Commonly known as: NORCO/VICODIN Stopped by: Rennie Plowman   ondansetron 4 MG disintegrating tablet Commonly known as: ZOFRAN-ODT Stopped by: Rennie Plowman       TAKE these medications    amLODipine 10 MG tablet Commonly known as: NORVASC Take 1 tablet (10 mg total) by mouth daily. What changed:  medication strength how much to take Changed  by: Rennie Plowman   desonide 0.05 % ointment Commonly known as: DESOWEN Apply 1 Application topically 2 (two) times daily.   levothyroxine 125 MCG tablet Commonly known as: SYNTHROID TAKE 1 TABLET BY MOUTH EVERY DAY   omeprazole 40 MG capsule Commonly known as: PRILOSEC TAKE 1 CAPSULE (40 MG TOTAL) BY MOUTH DAILY.   predniSONE 10 MG tablet Commonly known as: DELTASONE Take 4 tablets ( total 40 mg) by mouth for 2 days; take 3 tablets ( total 30 mg) by mouth for 2 days; take 2 tablets ( total 20 mg) by mouth for 1 day; take 1 tablet ( total 10 mg) by mouth for 1 day. Started by: Rennie Plowman   PRESERVISION AREDS 2 PO Take 1 tablet by mouth daily.   sertraline 50 MG tablet Commonly known as: ZOLOFT TAKE 1 TABLET BY MOUTH EVERY DAY   SIMPONI ARIA IV Infusion every 8 weeks        Allergies:  Allergies  Allergen Reactions   Penicillins     rash    Family History: Family History  Problem Relation Age of Onset   Breast cancer Mother 36   Lung cancer Mother    Macular degeneration Mother    Sudden death Father 21       committed suicide   Diabetes Father    Lung disease Sister        legionaires   Hepatitis C Brother    Colon cancer Neg Hx     Social History:  reports that she has never smoked. She has never used smokeless tobacco. She reports current alcohol use of about 4.0 standard drinks of alcohol per week. She reports that she does not use drugs.   Physical Exam: BP 135/75   Pulse 73   Constitutional:  Alert and oriented, No acute distress. HEENT: [place] AT, moist mucus membranes.  Trachea midline, no masses. Neurologic: Grossly intact, no focal deficits, moving all 4 extremities. Psychiatric: Normal mood and affect.   Pertinent Imaging: Renal ultrasound performed on 07/23/2023 is pending.  I personally reviewed it today. There is no appreciable overt hydronephrosis, no obvious stone burden. She does have a simple renal cyst on the left. Bladder is  unremarkable with bilateral ureteral jets.    Assessment & Plan:    1. Right kidney stone - Differential for stone formation could include predisposition to uric acid stones especially in the setting of low urinary pH versus calcium oxalate stones. She has had calcium oxalate in her urine previously suggestive that this is her stone composition.  - We discussed general stone prevention techniques including drinking plenty water with goal of producing 2.5 L urine daily, increased citric acid intake, avoidance of high oxalate containing foods, and decreased salt intake.  Information about dietary recommendations given today.  - We also discussed obtaining a uric acid level and treating it if it was elevated.  - We also discussed a low-purine diet today as a precaution - We will follow  up with her once the final report from the radiologist is available if there is any discrepancies between my interpretation and the radiologist's.   Return in about 1 year (around 07/24/2024) for KUB or sooner if she has recurrent symptoms.  I have reviewed the above documentation for accuracy and completeness, and I agree with the above.   Vanna Scotland, MD    [facility] [address] [place], [place] [address] [phone]

## 2023-07-25 NOTE — Progress Notes (Signed)
Assessment & Plan:  Great toe pain, right Assessment & Plan: Podagra on exam.  Presentation consistent with gout.  We had a long discussion regards to psoriatic arthritis, rheumatoid arthritis however typically patient has not experienced joint pain in her lower extremities from either of these.  She has missed 2 doses of IV simponi and is no longer on methotrexate.  Previously we have treated with colchicine however we opted today to start prednisone taper.  She will discuss this as well with dermatology as patient may benefit from a longer course of prednisone until symptoms are better controlled and she resumes biologic therapy.  Pending x-ray, labs  Orders: -     Uric acid -     DG Foot Complete Right; Future -     predniSONE; Take 4 tablets ( total 40 mg) by mouth for 2 days; take 3 tablets ( total 30 mg) by mouth for 2 days; take 2 tablets ( total 20 mg) by mouth for 1 day; take 1 tablet ( total 10 mg) by mouth for 1 day.  Dispense: 17 tablet; Refill: 0 -     Sedimentation rate -     C-reactive protein -     DG Ankle Complete Left; Future  History of vitamin D deficiency -     VITAMIN D 25 Hydroxy (Vit-D Deficiency, Fractures)  History of prediabetes -     Hemoglobin A1c  Primary hypertension Assessment & Plan: Improved as patient rested in exam room.  We discussed pill burden and we jointed agreed to discontinue atenolol and increase amlodipine to 10 mg.  Patient will monitor blood pressure.  Orders: -     TSH -     Comprehensive metabolic panel -     amLODIPine Besylate; Take 1 tablet (10 mg total) by mouth daily.  Dispense: 90 tablet; Refill: 3  Aortic atherosclerosis (HCC) -     Lipid panel  Hypothyroidism, unspecified type -     TSH  Other abnormal glucose -     Hemoglobin A1c  Vitamin D deficiency, unspecified -     VITAMIN D 25 Hydroxy (Vit-D Deficiency, Fractures)  Psoriasis Assessment & Plan: Uncontrolled.  Patient is off of Simponi, methotrexate.   Follow-up with dermatology today.  Discussed prednisone taper.       Return precautions given.   Risks, benefits, and alternatives of the medications and treatment plan prescribed today were discussed, and patient expressed understanding.   Education regarding symptom management and diagnosis given to patient on AVS either electronically or printed.  No follow-ups on file.  Rennie Plowman, FNP  Subjective:    Patient ID: Jennifer Patrick, female    DOB: Jun 05, 1951, 72 y.o.   MRN: [id number]  CC: Jennifer Patrick is a 72 y.o. female who presents today for follow up.   HPI: She complains of lesions over her back and legs from psoriatic arthritis.  Very itchy.  It is worsened over the past couple of months. She is using desonide ointment with temporary relief.  She also has a history of rheumatoid arthritis and she has missed last two  infusion simponi.  She is no longer on methotrexate due to concern for drug interaction.  She has an appointment this afternoon with dermatology.  She also has follow-up with urology today     Complains of right toe pain x 5 days, unchanged.  Pain reminds her of previous gout episode.   Describes as tender to walk and to bend great toe.  Pain temporarily resolves with icy hot, ibuprofen 400mg  qd.  Complains  left medial ankle pain and swelling x 4 days.    Denies injury, fever, chills, wound.   Suspected history of gout.       She is no longer on HCTZ.  Previously treated with colchicine  Uric acid 7.0   She has yet to take amlodipine or atenolol today.  She is frustrated by number of pills that she is taking.  Denies palpitations, chest pain.   Allergies: Penicillins Current Outpatient Medications on File Prior to Visit  Medication Sig Dispense Refill   desonide (DESOWEN) 0.05 % ointment Apply 1 Application topically 2 (two) times daily.     Golimumab (SIMPONI ARIA IV) Infusion every 8 weeks     levothyroxine (SYNTHROID) 125  MCG tablet TAKE 1 TABLET BY MOUTH EVERY DAY 90 tablet 1   Multiple Vitamins-Minerals (PRESERVISION AREDS 2 PO) Take 1 tablet by mouth daily.     omeprazole (PRILOSEC) 40 MG capsule TAKE 1 CAPSULE (40 MG TOTAL) BY MOUTH DAILY. 90 capsule 1   sertraline (ZOLOFT) 50 MG tablet TAKE 1 TABLET BY MOUTH EVERY DAY 90 tablet 1   No current facility-administered medications on file prior to visit.    Review of Systems  Constitutional:  Negative for chills and fever.  Respiratory:  Negative for cough.   Cardiovascular:  Negative for chest pain and palpitations.  Gastrointestinal:  Negative for nausea and vomiting.  Musculoskeletal:  Positive for arthralgias.  Skin:  Positive for rash.      Objective:    BP 130/78   Pulse 65   Temp 97.8 F (36.6 C) (Oral)   Ht 5\' 3"  (1.6 m)   Wt 179 lb 6.4 oz (81.4 kg)   SpO2 97%   BMI 31.78 kg/m  BP Readings from Last 3 Encounters:  07/25/23 135/75  07/25/23 130/78  05/31/23 (!) 167/79   Wt Readings from Last 3 Encounters:  07/25/23 179 lb 6.4 oz (81.4 kg)  05/31/23 185 lb (83.9 kg)  05/24/23 185 lb (83.9 kg)    Physical Exam Vitals reviewed.  Constitutional:      Appearance: She is well-developed.  Eyes:     Conjunctiva/sclera: Conjunctivae normal.  Cardiovascular:     Rate and Rhythm: Normal rate and regular rhythm.     Pulses: Normal pulses.     Heart sounds: Normal heart sounds.  Pulmonary:     Effort: Pulmonary effort is normal.     Breath sounds: Normal breath sounds. No wheezing, rhonchi or rales.  Musculoskeletal:       Legs:     Comments: Pain over right first MTP.  No erythema, laceration, increased warmth or edema. Left medial trace edema, nonpitting.  No erythema, increased warmth, laceration  Skin:    General: Skin is warm and dry.          Comments: Multiple discrete erythematous patch like lesions over back  Neurological:     Mental Status: She is alert.  Psychiatric:        Speech: Speech normal.        Behavior:  Behavior normal.        Thought Content: Thought content normal.

## 2023-07-25 NOTE — Assessment & Plan Note (Signed)
Podagra on exam.  Presentation consistent with gout.  We had a long discussion regards to psoriatic arthritis, rheumatoid arthritis however typically patient has not experienced joint pain in her lower extremities from either of these.  She has missed 2 doses of IV simponi and is no longer on methotrexate.  Previously we have treated with colchicine however we opted today to start prednisone taper.  She will discuss this as well with dermatology as patient may benefit from a longer course of prednisone until symptoms are better controlled and she resumes biologic therapy.  Pending x-ray, labs

## 2023-07-25 NOTE — Assessment & Plan Note (Signed)
Improved as patient rested in exam room.  We discussed pill burden and we jointed agreed to discontinue atenolol and increase amlodipine to 10 mg.  Patient will monitor blood pressure.

## 2023-07-25 NOTE — Patient Instructions (Addendum)
  Stop atenolol Increase amlodipine from 5mg  to 10mg  daily   Monitor blood pressure at home and me 5-6 reading on separate days. Goal is less than 120/80, based on newest guidelines, however we certainly want to be less than 130/80;  if persistently higher, please make sooner follow up appointment so we can recheck you blood pressure and manage/ adjust medications.  I have sent in prednisone instead of colchicine due to exacerbation of psoriatic lesions.  If you start prednisone, please ensure you start in the morning and get all doses in by noon  Please discuss with dermatology and let me know if they are in agreement with starting.    Please call  and schedule your 3D mammogram and /or bone density scan as we discussed.   [facility]  ( new location in 2023)  [address], [place], [place] [address]  [place], [place]  [phone]

## 2023-07-25 NOTE — Assessment & Plan Note (Signed)
Uncontrolled.  Patient is off of Simponi, methotrexate.  Follow-up with dermatology today.  Discussed prednisone taper.

## 2023-07-26 ENCOUNTER — Other Ambulatory Visit: Payer: Self-pay | Admitting: Family

## 2023-07-26 ENCOUNTER — Encounter: Payer: Self-pay | Admitting: Family

## 2023-07-27 ENCOUNTER — Encounter: Payer: Self-pay | Admitting: Family

## 2023-07-30 ENCOUNTER — Other Ambulatory Visit: Payer: Self-pay | Admitting: Family

## 2023-07-30 DIAGNOSIS — E782 Mixed hyperlipidemia: Secondary | ICD-10-CM

## 2023-07-30 DIAGNOSIS — E039 Hypothyroidism, unspecified: Secondary | ICD-10-CM

## 2023-07-30 DIAGNOSIS — Z8639 Personal history of other endocrine, nutritional and metabolic disease: Secondary | ICD-10-CM

## 2023-07-30 DIAGNOSIS — E559 Vitamin D deficiency, unspecified: Secondary | ICD-10-CM

## 2023-07-30 MED ORDER — ROSUVASTATIN CALCIUM 5 MG PO TABS: 5.0000 mg | ORAL_TABLET | Freq: Every day | ORAL | Status: DC

## 2023-07-30 MED ORDER — LEVOTHYROXINE SODIUM 137 MCG PO TABS: 137.0000 ug | ORAL_TABLET | Freq: Every day | ORAL | 3 refills | Status: DC

## 2023-08-02 DIAGNOSIS — Z79899 Other long term (current) drug therapy: Secondary | ICD-10-CM | POA: Diagnosis not present

## 2023-08-02 DIAGNOSIS — L4050 Arthropathic psoriasis, unspecified: Secondary | ICD-10-CM | POA: Diagnosis not present

## 2023-08-03 DIAGNOSIS — Z961 Presence of intraocular lens: Secondary | ICD-10-CM | POA: Diagnosis not present

## 2023-08-03 DIAGNOSIS — H353133 Nonexudative age-related macular degeneration, bilateral, advanced atrophic without subfoveal involvement: Secondary | ICD-10-CM | POA: Diagnosis not present

## 2023-08-03 DIAGNOSIS — H43813 Vitreous degeneration, bilateral: Secondary | ICD-10-CM | POA: Diagnosis not present

## 2023-08-03 DIAGNOSIS — H35033 Hypertensive retinopathy, bilateral: Secondary | ICD-10-CM | POA: Diagnosis not present

## 2023-08-06 DIAGNOSIS — M545 Low back pain, unspecified: Secondary | ICD-10-CM | POA: Diagnosis not present

## 2023-08-06 DIAGNOSIS — Z6831 Body mass index (BMI) 31.0-31.9, adult: Secondary | ICD-10-CM | POA: Diagnosis not present

## 2023-08-06 DIAGNOSIS — L401 Generalized pustular psoriasis: Secondary | ICD-10-CM | POA: Diagnosis not present

## 2023-08-06 DIAGNOSIS — R7989 Other specified abnormal findings of blood chemistry: Secondary | ICD-10-CM | POA: Diagnosis not present

## 2023-08-06 DIAGNOSIS — M25512 Pain in left shoulder: Secondary | ICD-10-CM | POA: Diagnosis not present

## 2023-08-06 DIAGNOSIS — E669 Obesity, unspecified: Secondary | ICD-10-CM | POA: Diagnosis not present

## 2023-08-06 DIAGNOSIS — M79675 Pain in left toe(s): Secondary | ICD-10-CM | POA: Diagnosis not present

## 2023-08-06 DIAGNOSIS — R52 Pain, unspecified: Secondary | ICD-10-CM | POA: Diagnosis not present

## 2023-08-06 DIAGNOSIS — L4050 Arthropathic psoriasis, unspecified: Secondary | ICD-10-CM | POA: Diagnosis not present

## 2023-08-09 ENCOUNTER — Encounter: Payer: Self-pay | Admitting: Family

## 2023-09-17 DIAGNOSIS — Z6831 Body mass index (BMI) 31.0-31.9, adult: Secondary | ICD-10-CM | POA: Diagnosis not present

## 2023-09-17 DIAGNOSIS — L4050 Arthropathic psoriasis, unspecified: Secondary | ICD-10-CM | POA: Diagnosis not present

## 2023-09-17 DIAGNOSIS — E669 Obesity, unspecified: Secondary | ICD-10-CM | POA: Diagnosis not present

## 2023-09-17 DIAGNOSIS — M25561 Pain in right knee: Secondary | ICD-10-CM | POA: Diagnosis not present

## 2023-09-25 DIAGNOSIS — H353114 Nonexudative age-related macular degeneration, right eye, advanced atrophic with subfoveal involvement: Secondary | ICD-10-CM | POA: Diagnosis not present

## 2023-09-27 DIAGNOSIS — Z79899 Other long term (current) drug therapy: Secondary | ICD-10-CM | POA: Diagnosis not present

## 2023-09-27 DIAGNOSIS — L4050 Arthropathic psoriasis, unspecified: Secondary | ICD-10-CM | POA: Diagnosis not present

## 2023-10-17 DIAGNOSIS — L401 Generalized pustular psoriasis: Secondary | ICD-10-CM | POA: Diagnosis not present

## 2023-10-17 DIAGNOSIS — M25561 Pain in right knee: Secondary | ICD-10-CM | POA: Diagnosis not present

## 2023-10-17 DIAGNOSIS — R7989 Other specified abnormal findings of blood chemistry: Secondary | ICD-10-CM | POA: Diagnosis not present

## 2023-10-17 DIAGNOSIS — E669 Obesity, unspecified: Secondary | ICD-10-CM | POA: Diagnosis not present

## 2023-10-17 DIAGNOSIS — L4050 Arthropathic psoriasis, unspecified: Secondary | ICD-10-CM | POA: Diagnosis not present

## 2023-10-17 DIAGNOSIS — M5441 Lumbago with sciatica, right side: Secondary | ICD-10-CM | POA: Diagnosis not present

## 2023-10-17 DIAGNOSIS — Z6831 Body mass index (BMI) 31.0-31.9, adult: Secondary | ICD-10-CM | POA: Diagnosis not present

## 2023-10-17 DIAGNOSIS — M79675 Pain in left toe(s): Secondary | ICD-10-CM | POA: Diagnosis not present

## 2023-10-17 DIAGNOSIS — A421 Abdominal actinomycosis: Secondary | ICD-10-CM | POA: Diagnosis not present

## 2023-10-23 DIAGNOSIS — H43813 Vitreous degeneration, bilateral: Secondary | ICD-10-CM | POA: Diagnosis not present

## 2023-10-23 DIAGNOSIS — Z961 Presence of intraocular lens: Secondary | ICD-10-CM | POA: Diagnosis not present

## 2023-10-23 DIAGNOSIS — H35033 Hypertensive retinopathy, bilateral: Secondary | ICD-10-CM | POA: Diagnosis not present

## 2023-10-23 DIAGNOSIS — H353123 Nonexudative age-related macular degeneration, left eye, advanced atrophic without subfoveal involvement: Secondary | ICD-10-CM | POA: Diagnosis not present

## 2023-11-20 DIAGNOSIS — H43813 Vitreous degeneration, bilateral: Secondary | ICD-10-CM | POA: Diagnosis not present

## 2023-11-20 DIAGNOSIS — H353134 Nonexudative age-related macular degeneration, bilateral, advanced atrophic with subfoveal involvement: Secondary | ICD-10-CM | POA: Diagnosis not present

## 2023-11-20 DIAGNOSIS — H35033 Hypertensive retinopathy, bilateral: Secondary | ICD-10-CM | POA: Diagnosis not present

## 2023-11-20 DIAGNOSIS — Z961 Presence of intraocular lens: Secondary | ICD-10-CM | POA: Diagnosis not present

## 2023-11-22 DIAGNOSIS — L4050 Arthropathic psoriasis, unspecified: Secondary | ICD-10-CM | POA: Diagnosis not present

## 2023-11-24 ENCOUNTER — Other Ambulatory Visit: Payer: Self-pay | Admitting: Family

## 2023-11-24 DIAGNOSIS — K219 Gastro-esophageal reflux disease without esophagitis: Secondary | ICD-10-CM

## 2024-01-07 ENCOUNTER — Ambulatory Visit (INDEPENDENT_AMBULATORY_CARE_PROVIDER_SITE_OTHER)

## 2024-01-07 VITALS — Ht 63.0 in | Wt 174.0 lb

## 2024-01-07 DIAGNOSIS — Z1231 Encounter for screening mammogram for malignant neoplasm of breast: Secondary | ICD-10-CM | POA: Diagnosis not present

## 2024-01-07 DIAGNOSIS — Z0000 Encounter for general adult medical examination without abnormal findings: Secondary | ICD-10-CM | POA: Diagnosis not present

## 2024-01-07 NOTE — Progress Notes (Signed)
 Subjective:   Jennifer Patrick is a 73 y.o. who presents for a Medicare Wellness preventive visit.  Visit Complete: Virtual I connected with  Jennifer Patrick on 01/07/24 by a audio enabled telemedicine application and verified that I am speaking with the correct person using two identifiers.  Patient Location: Home  Provider Location: Office/Clinic  I discussed the limitations of evaluation and management by telemedicine. The patient expressed understanding and agreed to proceed.  Vital Signs: Because this visit was a virtual/telehealth visit, some criteria may be missing or patient reported. Any vitals not documented were not able to be obtained and vitals that have been documented are patient reported.  VideoDeclined- This patient declined Librarian, academic. Therefore the visit was completed with audio only.  Persons Participating in Visit: Patient.  AWV Questionnaire: Yes: Patient Medicare AWV questionnaire was completed by the patient on 01/07/24; I have confirmed that all information answered by patient is correct and no changes since this date.  Cardiac Risk Factors include: advanced age (>8men, >53 women);dyslipidemia;obesity (BMI >30kg/m2);hypertension     Objective:    Today's Vitals   01/07/24 1346 01/07/24 1347  Weight: 174 lb (78.9 kg)   Height: 5\' 3"  (1.6 m)   PainSc:  5    Body mass index is 30.82 kg/m.     01/07/2024    2:06 PM 05/24/2023   10:40 AM 05/18/2023    2:20 PM 02/02/2022    6:57 AM 12/14/2021    1:20 PM 12/27/2020   10:09 AM 06/03/2020    9:33 AM  Advanced Directives  Does Patient Have a Medical Advance Directive? Yes Yes Yes No No No No  Type of Estate agent of [place];Living will Healthcare Power of [place];Living will Healthcare Power of [place];Living will      Does patient want to make changes to medical advance directive?  No - Patient declined No - Patient declined      Copy of  Healthcare Power of Attorney in Chart? No - copy requested No - copy requested No - copy requested      Would patient like information on creating a medical advance directive?     No - Patient declined No - Patient declined No - Patient declined    Current Medications (verified) Outpatient Encounter Medications as of 01/07/2024  Medication Sig   amLODipine  (NORVASC ) 10 MG tablet Take 1 tablet (10 mg total) by mouth daily.   Avacincaptad Pegol (IZERVAY) 2 MG/0.1ML SOLN Inject as directed. Every 8 weeks   desonide (DESOWEN) 0.05 % ointment Apply 1 Application topically 2 (two) times daily.   diclofenac (VOLTAREN) 75 MG EC tablet Take 75 mg by mouth 2 (two) times daily.   Golimumab  (SIMPONI  ARIA IV) Infusion every 8 weeks   levothyroxine  (SYNTHROID ) 137 MCG tablet Take 1 tablet (137 mcg total) by mouth daily before breakfast.   Multiple Vitamins-Minerals (PRESERVISION AREDS 2 PO) Take 1 tablet by mouth daily.   omeprazole  (PRILOSEC) 40 MG capsule TAKE 1 CAPSULE (40 MG TOTAL) BY MOUTH DAILY.   sertraline  (ZOLOFT ) 50 MG tablet TAKE 1 TABLET BY MOUTH EVERY DAY   rosuvastatin  (CRESTOR ) 5 MG tablet Take 1 tablet (5 mg total) by mouth daily. (Patient not taking: Reported on 01/07/2024)   [DISCONTINUED] predniSONE  (DELTASONE ) 10 MG tablet Take 4 tablets ( total 40 mg) by mouth for 2 days; take 3 tablets ( total 30 mg) by mouth for 2 days; take 2 tablets ( total 20 mg) by  mouth for 1 day; take 1 tablet ( total 10 mg) by mouth for 1 day.   No facility-administered encounter medications on file as of 01/07/2024.    Allergies (verified) Penicillins   History: Past Medical History:  Diagnosis Date   Anxiety    Aortic atherosclerosis (HCC)    Arthritis    Cataract    Coronary artery calcification seen on CT scan    GERD (gastroesophageal reflux disease)    History of kidney stones    Hypertension    Hypothyroidism    Psoriasis    Dr. Bary Likes, on embrel   Sleep apnea    Wears partial dentures     upper   Past Surgical History:  Procedure Laterality Date   ABDOMINAL HYSTERECTOMY  1999   for abnormal pap, TVH. No ovaries or cervix.    BRAVO PH STUDY N/A 02/02/2022   Procedure: BRAVO PH STUDY;  Surgeon: Marnee Sink, MD;  Location: [facility] END[facility]COPY;  Service: Endoscopy;  Laterality: N/A;   BREAST EXCISIONAL BIOPSY Left 1981   benign   CATARACT EXTRACTION Bilateral    CHOLECYSTECTOMY     COLONOSCOPY WITH PROPOFOL  N/A 12/27/2020   Procedure: COLONOSCOPY WITH PROPOFOL ;  Surgeon: Marnee Sink, MD;  Location: [facility];  Service: Endoscopy;  Laterality: N/A;   CYSTOSCOPY/URETEROSCOPY/HOLMIUM LASER/STENT PLACEMENT Right 05/24/2023   Procedure: CYSTOSCOPY/URETEROSCOPY/HOLMIUM LASER/STENT PLACEMENT;  Surgeon: Dustin Gimenez, MD;  Location: [facility] ORS;  Service: Urology;  Laterality: Right;   ESOPHAGOGASTRODUODENOSCOPY N/A 02/02/2022   Procedure: ESOPHAGOGASTRODUODENOSCOPY (EGD);  Surgeon: Marnee Sink, MD;  Location: [facility] END[facility]COPY;  Service: Endoscopy;  Laterality: N/A;   THYROIDECTOMY  1976   VITRECTOMY Right 2014   Family History  Problem Relation Age of Onset   Breast cancer Mother 42   Lung cancer Mother    Macular degeneration Mother    Sudden death Father 50       committed suicide   Diabetes Father    Lung disease Sister        legionaires   Hepatitis C Brother    Colon cancer Neg Hx    Social History   Socioeconomic History   Marital status: Widowed    Spouse name: Not on file   Number of children: 2   Years of education: Not on file   Highest education level: Bachelor's degree (e.g., BA, AB, BS)  Occupational History   Occupation: retired  Tobacco Use   Smoking status: Never   Smokeless tobacco: Never  Vaping Use   Vaping status: Never Used  Substance and Sexual Activity   Alcohol use: Yes    Alcohol/week: 4.0 standard drinks of alcohol    Types: 4 Standard drinks or equivalent per week   Drug use: No   Sexual activity: Not on file  Other Topics  Concern   Not on file  Social History Narrative   Works at [employer], 2014. Lives in [place].   Enjoys going to [employer] [employer]. Grandchildren in [place].       Has son and daughter.   Lives alone, widow since 2012.       Social Drivers of Corporate investment banker Strain: Low Risk  (01/07/2024)   Overall Financial Resource Strain (CARDIA)    Difficulty of Paying Living Expenses: Not hard at all  Food Insecurity: No Food Insecurity (01/07/2024)   Hunger Vital Sign    Worried About Running Out of Food in the Last Year: Never true    Ran Out of Food in the  Last Year: Never true  Transportation Needs: No Transportation Needs (01/07/2024)   PRAPARE - Administrator, Civil Service (Medical): No    Lack of Transportation (Non-Medical): No  Physical Activity: Inactive (01/07/2024)   Exercise Vital Sign    Days of Exercise per Week: 0 days    Minutes of Exercise per Session: 0 min  Stress: No Stress Concern Present (01/07/2024)   [employer] of Occupational Health - Occupational Stress Questionnaire    Feeling of Stress : Not at all  Social Connections: Moderately Isolated (01/07/2024)   Social Connection and Isolation Panel [NHANES]    Frequency of Communication with Friends and Family: More than three times a week    Frequency of Social Gatherings with Friends and Family: Once a week    Attends Religious Services: More than 4 times per year    Active Member of [employer] or Organizations: No    Attends Banker Meetings: Never    Marital Status: Widowed    Tobacco Counseling Counseling given: Not Answered    Clinical Intake:  Pre-visit preparation completed: Yes  Pain : 0-10 Pain Score: 5  Pain Type: Chronic pain Pain Location: Knee Pain Orientation: Right Pain Descriptors / Indicators: Nagging Pain Onset: More than a month ago Pain Frequency: Intermittent     BMI - recorded: 30.82 Nutritional Status: BMI > 30  Obese Nutritional  Risks: None Diabetes: No  Lab Results  Component Value Date   HGBA1C 5.8 07/25/2023   HGBA1C 6.1 04/26/2022     How often do you need to have someone help you when you read instructions, pamphlets, or other written materials from your doctor or pharmacy?: 1 - Never  Interpreter Needed?: No  Information entered by :: R. Haik Mahoney LPN   Activities of Daily Living     01/07/2024    1:48 PM 05/24/2023   10:54 AM  In your present state of health, do you have any difficulty performing the following activities:  Hearing? 0 0  Vision? 0 0  Difficulty concentrating or making decisions? 0 0  Walking or climbing stairs? 0 0  Dressing or bathing? 0 0  Doing errands, shopping? 0   Preparing Food and eating ? N   Using the Toilet? N   In the past six months, have you accidently leaked urine? N   Do you have problems with loss of bowel control? Y   Managing your Medications? N   Managing your Finances? N   Housekeeping or managing your Housekeeping? N     Patient Care Team: Calista Catching, FNP as PCP - General (Family Medicine) End, Veryl Gottron, MD as PCP - Cardiology (Cardiology) Thomes Flicker, MD (Internal Medicine)  Indicate any recent Medical Services you may have received from other than [facility] providers in the past year (date may be approximate).     Assessment:   This is a routine wellness examination for [place].  Hearing/Vision screen Hearing Screening - Comments:: No issues Vision Screening - Comments:: glasses   Goals Addressed             This Visit's Progress    Patient Stated       Wants to continue to be independent        Depression Screen     01/07/2024    1:59 PM 07/25/2023    9:04 AM 01/22/2023    8:23 AM 07/24/2022    9:36 AM 04/26/2022   11:48 AM 12/14/2021    1:14  PM 08/15/2021    8:59 AM  PHQ 2/9 Scores  PHQ - 2 Score 0 0 0 0 0 0 0  PHQ- 9 Score 0  0  0      Fall Risk     01/07/2024    1:51 PM 07/25/2023    9:04 AM 01/22/2023    8:23 AM  07/24/2022    9:36 AM 04/26/2022   11:48 AM  Fall Risk   Falls in the past year? 0 0 0 0 0  Number falls in past yr: 0 0 0 0   Injury with Fall? 0 0 0 0   Risk for fall due to : No Fall Risks No Fall Risks No Fall Risks No Fall Risks No Fall Risks  Follow up Falls prevention discussed;Falls evaluation completed Falls evaluation completed Falls evaluation completed Falls evaluation completed Falls evaluation completed    MEDICARE RISK AT HOME:  Medicare Risk at Home Any stairs in or around the home?: Yes If so, are there any without handrails?: No Home free of loose throw rugs in walkways, pet beds, electrical cords, etc?: Yes Adequate lighting in your home to reduce risk of falls?: Yes Life alert?: No Use of a cane, walker or w/c?: No Grab bars in the bathroom?: Yes Shower chair or bench in shower?: Yes Elevated toilet seat or a handicapped toilet?: Yes  TIMED UP AND GO:  Was the test performed?  No  Cognitive Function: 6CIT completed    05/31/2018   10:06 AM 12/16/2014    9:54 AM  MMSE - Mini Mental State Exam  Orientation to time 5 5  Orientation to Place 5 5  Registration 3 3  Attention/ Calculation 5 5  Recall 3 3  Language- name 2 objects 2 2  Language- repeat 1 1  Language- follow 3 step command 3 3  Language- read & follow direction 1 1  Write a sentence 1 1  Copy design 1 1  Total score 30 30        01/07/2024    2:06 PM 06/03/2019   10:02 AM  6CIT Screen  What Year? 0 points 0 points  What month? 0 points 0 points  What time? 0 points 0 points  Count back from 20 0 points 0 points  Months in reverse 0 points 0 points  Repeat phrase 0 points 0 points  Total Score 0 points 0 points    Immunizations Immunization History  Administered Date(s) Administered   Fluad Quad(high Dose 65+) 06/10/2019, 08/18/2022   Influenza Whole 07/03/2011, 07/07/2012   Influenza, High Dose Seasonal PF 07/11/2020   Influenza,inj,Quad PF,6+ Mos 07/14/2015    Influenza-Unspecified 07/14/2013, 06/19/2016, 06/10/2017, 06/03/2019   Moderna Covid-19 Fall Seasonal Vaccine 35yrs & older 08/18/2022   Moderna Covid-19 Vaccine  Bivalent Booster 77yrs & up 07/20/2021   Moderna Sars-Covid-2 Vaccination 10/25/2019, 11/24/2019, 07/11/2020, 02/25/2021   Pfizer(Comirnaty)Fall Seasonal Vaccine 12 years and older 08/18/2022   Pneumococcal Conjugate-13 12/12/2013, 06/10/2019   Pneumococcal Polysaccharide-23 12/16/2014   Respiratory Syncytial Virus Vaccine,Recomb Aduvanted(Arexvy) 08/18/2022   Rsv, Mab, Trudy Fusi, 0.5 Ml, Neonate To 24 Mos(Beyfortus) 08/18/2022   Tdap 07/07/2010   Zoster Recombinant(Shingrix) 12/22/2021, 07/14/2022   Zoster, Live 10/05/2011    Screening Tests Health Maintenance  Topic Date Due   DTaP/Tdap/Td (2 - Td or Tdap) 07/07/2020   Medicare Annual Wellness (AWV)  12/15/2022   COVID-19 Vaccine (7 - 2024-25 season) 05/20/2023   INFLUENZA VACCINE  04/18/2024   Colonoscopy  12/28/2030   Pneumonia Vaccine  8+ Years old  Completed   DEXA SCAN  Completed   Hepatitis C Screening  Completed   Zoster Vaccines- Shingrix  Completed   HPV VACCINES  Aged Out   Meningococcal B Vaccine  Aged Out   Fecal DNA (Cologuard)  Discontinued    Health Maintenance  Health Maintenance Due  Topic Date Due   DTaP/Tdap/Td (2 - Td or Tdap) 07/07/2020   Medicare Annual Wellness (AWV)  12/15/2022   COVID-19 Vaccine (7 - 2024-25 season) 05/20/2023   Health Maintenance Items Addressed: Mammogram ordered, Discussed the need to update tetanus and covid vaccine and to get a flu vaccine annually. Patient declines Bone Density .   Additional Screening:  Vision Screening: Recommended annual ophthalmology exams for early detection of glaucoma and other disorders of the eye.Up to date [facility]  Dental Screening: Recommended annual dental exams for proper oral hygiene  Community Resource Referral / Chronic Care Management: CRR required this visit?  No    CCM required this visit?  No     Plan:     I have personally reviewed and noted the following in the patient's chart:   Medical and social history Use of alcohol, tobacco or illicit drugs  Current medications and supplements including opioid prescriptions. Patient is not currently taking opioid prescriptions. Functional ability and status Nutritional status Physical activity Advanced directives List of other physicians Hospitalizations, surgeries, and ER visits in previous 12 months Vitals Screenings to include cognitive, depression, and falls Referrals and appointments  In addition, I have reviewed and discussed with patient certain preventive protocols, quality metrics, and best practice recommendations. A written personalized care plan for preventive services as well as general preventive health recommendations were provided to patient.     Felicitas Horse, LPN   [id number]   After Visit Summary: (MyChart) Due to this being a telephonic visit, the after visit summary with patients personalized plan was offered to patient via MyChart   Notes: Nothing significant to report at this time.

## 2024-01-07 NOTE — Patient Instructions (Signed)
 Jennifer Patrick , Thank you for taking time to come for your Medicare Wellness Visit. I appreciate your ongoing commitment to your health goals. Please review the following plan we discussed and let me know if I can assist you in the future.   Referrals/Orders/Follow-Ups/Clinician Recommendations: Mammogram was ordered.  Remember to update your Tetanus and covid vaccines. You have an order for:  []   2D Mammogram  [x]   3D Mammogram  []   Bone Density     Please call for appointment:  [facility]  [address] [place] [address] [phone]     Make sure to wear two-piece clothing.  No lotions, powders, or deodorants the day of the appointment. Make sure to bring picture ID and insurance card.  Bring list of medications you are currently taking including any supplements.    This is a list of the screening recommended for you and due dates:  Health Maintenance  Topic Date Due   DTaP/Tdap/Td vaccine (2 - Td or Tdap) 07/07/2020   COVID-19 Vaccine (7 - 2024-25 season) 05/20/2023   Mammogram  10/20/2023   Flu Shot  04/18/2024   Medicare Annual Wellness Visit  01/06/2025   Colon Cancer Screening  12/28/2030   Pneumonia Vaccine  Completed   DEXA scan (bone density measurement)  Completed   Hepatitis C Screening  Completed   Zoster (Shingles) Vaccine  Completed   HPV Vaccine  Aged Out   Meningitis B Vaccine  Aged Out   Cologuard (Stool DNA test)  Discontinued    Advanced directives: (Copy Requested) Please bring a copy of your health care power of attorney and living will to the office to be added to your chart at your convenience. You can mail to [facility] [address] [place], [place] [address] or email to [email or web address]  Next Medicare Annual Wellness Visit scheduled for next year: Yes 01/12/25 @ 1:10

## 2024-01-15 DIAGNOSIS — H353134 Nonexudative age-related macular degeneration, bilateral, advanced atrophic with subfoveal involvement: Secondary | ICD-10-CM | POA: Diagnosis not present

## 2024-01-17 DIAGNOSIS — L4050 Arthropathic psoriasis, unspecified: Secondary | ICD-10-CM | POA: Diagnosis not present

## 2024-01-17 DIAGNOSIS — R5383 Other fatigue: Secondary | ICD-10-CM | POA: Diagnosis not present

## 2024-01-17 DIAGNOSIS — Z79899 Other long term (current) drug therapy: Secondary | ICD-10-CM | POA: Diagnosis not present

## 2024-03-11 DIAGNOSIS — H353134 Nonexudative age-related macular degeneration, bilateral, advanced atrophic with subfoveal involvement: Secondary | ICD-10-CM | POA: Diagnosis not present

## 2024-03-11 DIAGNOSIS — H43813 Vitreous degeneration, bilateral: Secondary | ICD-10-CM | POA: Diagnosis not present

## 2024-03-11 DIAGNOSIS — H35033 Hypertensive retinopathy, bilateral: Secondary | ICD-10-CM | POA: Diagnosis not present

## 2024-03-11 DIAGNOSIS — Z961 Presence of intraocular lens: Secondary | ICD-10-CM | POA: Diagnosis not present

## 2024-03-13 DIAGNOSIS — Z111 Encounter for screening for respiratory tuberculosis: Secondary | ICD-10-CM | POA: Diagnosis not present

## 2024-03-13 DIAGNOSIS — Z79899 Other long term (current) drug therapy: Secondary | ICD-10-CM | POA: Diagnosis not present

## 2024-03-13 DIAGNOSIS — R5383 Other fatigue: Secondary | ICD-10-CM | POA: Diagnosis not present

## 2024-03-13 DIAGNOSIS — L4050 Arthropathic psoriasis, unspecified: Secondary | ICD-10-CM | POA: Diagnosis not present

## 2024-04-03 DIAGNOSIS — M1711 Unilateral primary osteoarthritis, right knee: Secondary | ICD-10-CM | POA: Diagnosis not present

## 2024-04-13 ENCOUNTER — Other Ambulatory Visit: Payer: Self-pay | Admitting: Family

## 2024-04-15 ENCOUNTER — Encounter: Payer: Self-pay | Admitting: Family

## 2024-04-15 DIAGNOSIS — R7989 Other specified abnormal findings of blood chemistry: Secondary | ICD-10-CM | POA: Diagnosis not present

## 2024-04-15 DIAGNOSIS — L401 Generalized pustular psoriasis: Secondary | ICD-10-CM | POA: Diagnosis not present

## 2024-04-15 DIAGNOSIS — M25561 Pain in right knee: Secondary | ICD-10-CM | POA: Diagnosis not present

## 2024-04-15 DIAGNOSIS — L4050 Arthropathic psoriasis, unspecified: Secondary | ICD-10-CM | POA: Diagnosis not present

## 2024-04-15 DIAGNOSIS — M5441 Lumbago with sciatica, right side: Secondary | ICD-10-CM | POA: Diagnosis not present

## 2024-04-15 DIAGNOSIS — Z6831 Body mass index (BMI) 31.0-31.9, adult: Secondary | ICD-10-CM | POA: Diagnosis not present

## 2024-04-15 DIAGNOSIS — E669 Obesity, unspecified: Secondary | ICD-10-CM | POA: Diagnosis not present

## 2024-04-15 NOTE — Telephone Encounter (Signed)
 Pt requesting a refill for Zoloft  was last filled in 2023. E-Prescribing Status: Receipt confirmed by pharmacy (05/18/2022  1:50 PM EDT)

## 2024-04-16 ENCOUNTER — Other Ambulatory Visit: Payer: Self-pay | Admitting: Family

## 2024-04-16 MED ORDER — SERTRALINE HCL 50 MG PO TABS: 50.0000 mg | ORAL_TABLET | Freq: Every day | ORAL | 1 refills | Status: AC

## 2024-04-16 MED ORDER — SERTRALINE HCL 50 MG PO TABS: 50.0000 mg | ORAL_TABLET | Freq: Every day | ORAL | 0 refills | Status: DC

## 2024-04-16 NOTE — Telephone Encounter (Signed)
 Copied from CRM [id number]. Topic: Clinical - Medication Refill >> Apr 16, 2024  8:59 AM Thersia C wrote: Medication: sertraline  (ZOLOFT ) 50 MG tablet  Has the patient contacted their pharmacy? Yes (Agent: If no, request that the patient contact the pharmacy for the refill. If patient does not wish to contact the pharmacy document the reason why and proceed with request.) (Agent: If yes, when and what did the pharmacy advise?)  This is the patient's preferred pharmacy:  [id number] [place], [place] - [address] [place] [address] Phone: [phone] Fax: [phone]    Is this the correct pharmacy for this prescription? Yes If no, delete pharmacy and type the correct one.   Has the prescription been filled recently? No  Is the patient out of the medication? Yes  Has the patient been seen for an appointment in the last year OR does the patient have an upcoming appointment? Yes  Can we respond through MyChart? Yes  Agent: Please be advised that Rx refills may take up to 3 business days. We ask that you follow-up with your pharmacy.

## 2024-05-08 DIAGNOSIS — L4050 Arthropathic psoriasis, unspecified: Secondary | ICD-10-CM | POA: Diagnosis not present

## 2024-05-13 ENCOUNTER — Other Ambulatory Visit: Payer: Self-pay | Admitting: Family

## 2024-05-13 DIAGNOSIS — E039 Hypothyroidism, unspecified: Secondary | ICD-10-CM

## 2024-05-13 DIAGNOSIS — K219 Gastro-esophageal reflux disease without esophagitis: Secondary | ICD-10-CM

## 2024-05-13 DIAGNOSIS — I10 Essential (primary) hypertension: Secondary | ICD-10-CM

## 2024-05-20 DIAGNOSIS — H353134 Nonexudative age-related macular degeneration, bilateral, advanced atrophic with subfoveal involvement: Secondary | ICD-10-CM | POA: Diagnosis not present

## 2024-05-21 ENCOUNTER — Ambulatory Visit (INDEPENDENT_AMBULATORY_CARE_PROVIDER_SITE_OTHER): Admitting: Family

## 2024-05-21 ENCOUNTER — Ambulatory Visit: Attending: Family

## 2024-05-21 ENCOUNTER — Encounter: Payer: Self-pay | Admitting: Family

## 2024-05-21 ENCOUNTER — Telehealth: Payer: Self-pay

## 2024-05-21 VITALS — BP 140/88 | HR 77 | Temp 97.8°F | Ht 63.0 in | Wt 176.6 lb

## 2024-05-21 DIAGNOSIS — R002 Palpitations: Secondary | ICD-10-CM | POA: Insufficient documentation

## 2024-05-21 DIAGNOSIS — E039 Hypothyroidism, unspecified: Secondary | ICD-10-CM

## 2024-05-21 DIAGNOSIS — R7303 Prediabetes: Secondary | ICD-10-CM | POA: Diagnosis not present

## 2024-05-21 DIAGNOSIS — R9431 Abnormal electrocardiogram [ECG] [EKG]: Secondary | ICD-10-CM

## 2024-05-21 DIAGNOSIS — Z87898 Personal history of other specified conditions: Secondary | ICD-10-CM | POA: Diagnosis not present

## 2024-05-21 DIAGNOSIS — F3342 Major depressive disorder, recurrent, in full remission: Secondary | ICD-10-CM | POA: Diagnosis not present

## 2024-05-21 DIAGNOSIS — Z13220 Encounter for screening for lipoid disorders: Secondary | ICD-10-CM | POA: Diagnosis not present

## 2024-05-21 DIAGNOSIS — I10 Essential (primary) hypertension: Secondary | ICD-10-CM | POA: Diagnosis not present

## 2024-05-21 DIAGNOSIS — Z136 Encounter for screening for cardiovascular disorders: Secondary | ICD-10-CM

## 2024-05-21 LAB — COMPREHENSIVE METABOLIC PANEL WITH GFR
ALT: 11 U/L (ref 0–35)
AST: 17 U/L (ref 0–37)
Albumin: 3.9 g/dL (ref 3.5–5.2)
Alkaline Phosphatase: 101 U/L (ref 39–117)
BUN: 9 mg/dL (ref 6–23)
CO2: 31 meq/L (ref 19–32)
Calcium: 8.9 mg/dL (ref 8.4–10.5)
Chloride: 105 meq/L (ref 96–112)
Creatinine, Ser: 0.75 mg/dL (ref 0.40–1.20)
GFR: 77.84 mL/min (ref >60.00)
Glucose, Bld: 79 mg/dL (ref 70–99)
Potassium: 3.7 meq/L (ref 3.5–5.1)
Sodium: 143 meq/L (ref 135–145)
Total Bilirubin: 0.6 mg/dL (ref 0.2–1.2)
Total Protein: 6.7 g/dL (ref 6.0–8.3)

## 2024-05-21 LAB — LIPID PANEL
Cholesterol: 205 mg/dL — ABNORMAL HIGH (ref 0–200)
HDL: 52.1 mg/dL (ref >39.00)
LDL Cholesterol: 130 mg/dL — ABNORMAL HIGH (ref 0–99)
NonHDL: 153.11
Total CHOL/HDL Ratio: 4
Triglycerides: 115 mg/dL (ref 0.0–149.0)
VLDL: 23 mg/dL (ref 0.0–40.0)

## 2024-05-21 LAB — CBC WITH DIFFERENTIAL/PLATELET
Basophils Absolute: 0 K/uL (ref 0.0–0.1)
Basophils Relative: 0.5 % (ref 0.0–3.0)
Eosinophils Absolute: 0.2 K/uL (ref 0.0–0.7)
Eosinophils Relative: 2.2 % (ref 0.0–5.0)
HCT: 43.9 % (ref 36.0–46.0)
Hemoglobin: 14.3 g/dL (ref 12.0–15.0)
Lymphocytes Relative: 32.2 % (ref 12.0–46.0)
Lymphs Abs: 2.5 K/uL (ref 0.7–4.0)
MCHC: 32.7 g/dL (ref 30.0–36.0)
MCV: 87.3 fl (ref 78.0–100.0)
Monocytes Absolute: 0.7 K/uL (ref 0.1–1.0)
Monocytes Relative: 9.2 % (ref 3.0–12.0)
Neutro Abs: 4.3 K/uL (ref 1.4–7.7)
Neutrophils Relative %: 55.9 % (ref 43.0–77.0)
Platelets: 215 K/uL (ref 150.0–400.0)
RBC: 5.03 Mil/uL (ref 3.87–5.11)
RDW: 14.8 % (ref 11.5–15.5)
WBC: 7.7 K/uL (ref 4.0–10.5)

## 2024-05-21 LAB — HEMOGLOBIN A1C: Hgb A1c MFr Bld: 5.8 % (ref 4.6–6.5)

## 2024-05-21 LAB — TSH: TSH: 0.58 u[IU]/mL (ref 0.35–5.50)

## 2024-05-21 MED ORDER — LOSARTAN POTASSIUM 25 MG PO TABS: 12.5000 mg | ORAL_TABLET | Freq: Every day | ORAL | 1 refills | Status: AC

## 2024-05-21 NOTE — Assessment & Plan Note (Signed)
 Well-managed on Zoloft  50 mg daily. She Continue Zoloft  50 mg daily.

## 2024-05-21 NOTE — Telephone Encounter (Signed)
 noted

## 2024-05-21 NOTE — Patient Instructions (Addendum)
 I have ordered echocardiogram  Let us  know if you dont hear back within 2 weeks in regards to an appointment being scheduled.   So that you are aware, if you are [facility] MyChart user , please pay attention to your MyChart messages as you may receive a MyChart message with a phone number to call and schedule this test/appointment own your own from our referral coordinator. This is a new process so I do not want you to miss this message.  If you are not a MyChart user, you will receive a phone call.   I have also ordered a 14 day Zio monitor which will mailed directly to you. The device will include instructions on how to apply.   The Zio 14 day monitor that we use DOES NOT have 24 hour live monitoring. The rhythm of your heart will not be monitored while you are wearing it. Only AFTER you mail the device back in and a cardiologist interprets the data will we know if an underlying cardiac arrhythmia is going on.   There are models that offer 24 hour live monitoring however NOT this one. I wanted to be sure that you were aware of this as certainly didn't want this to be a false sense of security.   If you experience chest pain, shortness of breath, left arm numbness, or sustained, more frequent palpitations , do not wait and call 911 right away. We are in a delicate period of work up regarding palpitations and until we are sure that you do not have an underlying arrhythmia, you must be extremely vigilant and cautious.      ZIO XT- Long Term Monitor Instructions   Your provider has requested you wear a ZIO patch monitor for 14 days.  This is a single patch monitor. [employer] supplies one patch monitor per enrollment. Additional stickers are not available. Please do not apply patch if you will be having a Nuclear Stress Test,  Echocardiogram, Cardiac CT, MRI, or Chest Xray during the period you would be wearing the  monitor. The patch cannot be worn during these tests. You cannot remove and re-apply the   ZIO XT patch monitor.  Your ZIO patch monitor will be mailed 3 day USPS to your address on file. It may take 3-5 days  to receive your monitor after you have been enrolled.  Once you have received your monitor, please review the enclosed instructions. Your monitor  has already been registered assigning a specific monitor serial # to you.   Billing and Patient Assistance Program Information   We have supplied [employer] with any of your insurance information on file for billing purposes. [employer] offers a sliding scale Patient Assistance Program for patients that do not have  insurance, or whose insurance does not completely cover the cost of the ZIO monitor.  You must apply for the Patient Assistance Program to qualify for this discounted rate.  To apply, please call [employer] at [phone], select option 4, select option 2, ask to apply for  Patient Assistance Program. Meredeth will ask your household income, and how many people  are in your household. They will quote your out-of-pocket cost based on that information.  [employer] will also be able to set up a 94-month, interest-free payment plan if needed.   Applying the monitor   Shave hair from upper left chest.  Hold abrader disc by orange tab. Rub abrader in 40 strokes over the upper left chest as  indicated in your monitor instructions.  Clean area with 4 enclosed alcohol pads. Let dry.  Apply patch as indicated in monitor instructions. Patch will be placed under collarbone on left  side of chest with arrow pointing upward.  Rub patch adhesive wings for 2 minutes. Remove white label marked 1. Remove the white  label marked 2. Rub patch adhesive wings for 2 additional minutes.  While looking in a mirror, press and release button in center of patch. A small green light will  flash 3-4 times. This will be your only indicator that the monitor has been turned on.  Do not shower for the first 24 hours. You may shower after the first 24  hours.  Press the button if you feel a symptom. You will hear a small click. Record Date, Time and  Symptom in the Patient Logbook.  When you are ready to remove the patch, follow instructions on the last 2 pages of Patient  Logbook. Stick patch monitor onto the last page of Patient Logbook.  Place Patient Logbook in the blue and white box. Use locking tab on box and tape box closed  securely. The blue and white box has prepaid postage on it. Please place it in the mailbox as  soon as possible. Your physician should have your test results approximately 7 days after the  monitor has been mailed back to [facility].  Call [facility] Customer Care at [phone] if you have questions regarding  your ZIO XT patch monitor. Call them immediately if you see an orange light blinking on your  monitor.  If your monitor falls off in less than 4 days, contact our Monitor department at [phone].  If your monitor becomes loose or falls off after 4 days call [employer] at [phone] for  suggestions on securing your monitor

## 2024-05-21 NOTE — Telephone Encounter (Signed)
 Patient saw [name], FNP, on 05/21/2024 and Rollene would like to see her again in one week.  I scheduled appointment for patient on 06/09/2024, which is next available appointment.  I let patient know that I would send message to Rollene to let her know.

## 2024-05-21 NOTE — Telephone Encounter (Signed)
 Spoke to pt scheduled lab appt for BMP on 05/28/24

## 2024-05-21 NOTE — Progress Notes (Signed)
 Assessment & Plan:  Palpitations Assessment & Plan: Intermittent palpitations with elevated blood pressure possibly contributing.  EKG with NSR. P wave > 2.28mm, question if increased from prior EKG 05/21/22.  Differential diagnosis includes arrhythmia, thyroid  dysfunction, anemia, and electrolyte abnormalities. Pending labs, zio monitor. For blood pressure management, start losartan  12.5 mg daily . Continue amlodipine  10mg . Discuss amlodipine  side effects and consider dose adjustment if pedal edema persists. Schedule follow-up in one week .  Orders: -     TSH -     CBC with Differential/Platelet -     Comprehensive metabolic panel with GFR -     Lipid panel -     EKG 12-Lead -     LONG TERM MONITOR (3-14 DAYS); Future -     ECHOCARDIOGRAM COMPLETE; Future  History of prediabetes  Encounter for lipid screening for cardiovascular disease -     Lipid panel  Hypothyroidism, unspecified type -     TSH  Prediabetes -     Hemoglobin A1c  Recurrent major depressive disorder, in full remission (HCC) Assessment & Plan: Well-managed on Zoloft  50 mg daily. She Continue Zoloft  50 mg daily.       Primary hypertension -     Losartan  Potassium; Take 0.5 tablets (12.5 mg total) by mouth daily.  Dispense: 90 tablet; Refill: 1  Abnormal EKG -     Losartan  Potassium; Take 0.5 tablets (12.5 mg total) by mouth daily.  Dispense: 90 tablet; Refill: 1  Abnormal electrocardiogram (ECG) (EKG) -     ECHOCARDIOGRAM COMPLETE; Future     Return precautions given.   Risks, benefits, and alternatives of the medications and treatment plan prescribed today were discussed, and patient expressed understanding.   Education regarding symptom management and diagnosis given to patient on AVS either electronically or printed.  Return in about 1 week (around 05/28/2024).  [name], FNP  Subjective:    Patient ID: Jennifer Patrick, female    DOB: [birth year], 73 y.o.   MRN: [id number]  CC:  Jennifer Patrick is a 73 y.o. female who presents today for follow up.   HPI: HPI Discussed the use of AI scribe software for clinical note transcription with the patient, who gave verbal consent to proceed.  History of Present Illness   Jennifer Patrick is a 73 year old female with hypertension who presents with palpitations and jitteriness.  She has been experiencing palpitations and a sensation of jitteriness for the past two to three weeks. The palpitations occur without specific triggers such as anxiety or stress, and they happen sporadically, even at rest. They last only a few seconds and resolve spontaneously. She denies chest pain, shortness of breath, or left arm numbness.   Compliant with amlodipine  10 mg daily. She notes her blood pressure tends to be high, especially in medical settings. She previously discontinued atenolol  and triamterene - hydrochlorothiazide due to concerns about taking multiple medications and potential gout flares. She has experienced ankle swelling, particularly in the right ankle, and notes that this has occurred during the summer, resolved today.  Her medication regimen includes Zoloft  50 mg, which she has been on for a long time, and she does not feel the need for an increased dose.   She consumes a moderate amount of caffeine daily, including a cup of coffee, 12 ounce bottle of Diet Coke, and a glass of tea. Denies drug use, sudafed use   An echocardiogram performed in 2023 showed a normal left ventricular ejection fraction  of 60-65% and no significant valvular disease.             Allergies: Penicillins Current Outpatient Medications on File Prior to Visit  Medication Sig Dispense Refill   amLODipine  (NORVASC ) 10 MG tablet TAKE 1 TABLET BY MOUTH EVERY DAY 90 tablet 3   Avacincaptad Pegol (IZERVAY) 2 MG/0.1ML SOLN Inject as directed. Every 8 weeks     desonide (DESOWEN) 0.05 % ointment Apply 1 Application topically 2 (two) times  daily.     diclofenac (VOLTAREN) 75 MG EC tablet Take 75 mg by mouth 2 (two) times daily.     Golimumab  (SIMPONI  ARIA IV) Infusion every 8 weeks     levothyroxine  (SYNTHROID ) 137 MCG tablet TAKE 1 TABLET BY MOUTH DAILY BEFORE BREAKFAST. 90 tablet 3   Multiple Vitamins-Minerals (PRESERVISION AREDS 2 PO) Take 1 tablet by mouth daily.     omeprazole  (PRILOSEC) 40 MG capsule TAKE 1 CAPSULE (40 MG TOTAL) BY MOUTH DAILY. 90 capsule 1   sertraline  (ZOLOFT ) 50 MG tablet Take 1 tablet (50 mg total) by mouth daily. 90 tablet 1   rosuvastatin  (CRESTOR ) 5 MG tablet Take 1 tablet (5 mg total) by mouth daily. (Patient not taking: Reported on 05/21/2024)     No current facility-administered medications on file prior to visit.    Review of Systems  Constitutional:  Negative for chills and fever.  Respiratory:  Negative for cough.   Cardiovascular:  Positive for palpitations. Negative for chest pain and leg swelling (none today).  Gastrointestinal:  Negative for nausea and vomiting.  Neurological:  Negative for dizziness and headaches.      Objective:    BP (!) 140/88 Comment: right arm  Pulse 77   Temp 97.8 F (36.6 C) (Oral)   Ht 5' 3 (1.6 m)   Wt 176 lb 9.6 oz (80.1 kg)   SpO2 98%   BMI 31.28 kg/m  BP Readings from Last 3 Encounters:  05/21/24 (!) 140/88  07/25/23 135/75  07/25/23 130/78   Wt Readings from Last 3 Encounters:  05/21/24 176 lb 9.6 oz (80.1 kg)  01/07/24 174 lb (78.9 kg)  07/25/23 179 lb 6.4 oz (81.4 kg)      05/21/2024    9:24 AM 01/07/2024    1:59 PM 07/25/2023    9:04 AM  Depression screen PHQ 2/9  Decreased Interest 0 0 0  Down, Depressed, Hopeless 0 0 0  PHQ - 2 Score 0 0 0  Altered sleeping  0   Tired, decreased energy  0   Change in appetite  0   Feeling bad or failure about yourself   0   Trouble concentrating  0   Moving slowly or fidgety/restless  0   Suicidal thoughts  0   PHQ-9 Score  0   Difficult doing work/chores  Not difficult at all        01/22/2023    8:23 AM  GAD 7 : Generalized Anxiety Score  Nervous, Anxious, on Edge 0  Control/stop worrying 0  Worry too much - different things 0  Trouble relaxing 0  Restless 0  Easily annoyed or irritable 0  Afraid - awful might happen 0  Total GAD 7 Score 0  Anxiety Difficulty Not difficult at all     Physical Exam Vitals reviewed.  Constitutional:      Appearance: She is well-developed.  Eyes:     Conjunctiva/sclera: Conjunctivae normal.  Cardiovascular:     Rate and Rhythm: Normal rate and regular  rhythm.     Pulses: Normal pulses.     Heart sounds: Normal heart sounds.     Comments: No LE edema, palpable cords or masses. No erythema or increased warmth. No asymmetry in calf size when compared bilaterally LE hair growth symmetric and present. No discoloration or varicosities noted. LE warm and palpable pedal pulses.  Pulmonary:     Effort: Pulmonary effort is normal.     Breath sounds: Normal breath sounds. No wheezing, rhonchi or rales.  Musculoskeletal:     Right lower leg: No edema.     Left lower leg: No edema.  Skin:    General: Skin is warm and dry.  Neurological:     Mental Status: She is alert.  Psychiatric:        Speech: Speech normal.        Behavior: Behavior normal.        Thought Content: Thought content normal.

## 2024-05-21 NOTE — Assessment & Plan Note (Addendum)
 Intermittent palpitations with elevated blood pressure possibly contributing.  EKG with NSR. P wave > 2.84mm, question if increased from prior EKG 05/21/22.  Differential diagnosis includes arrhythmia, thyroid  dysfunction, anemia, and electrolyte abnormalities. Pending labs, zio monitor. For blood pressure management, start losartan  12.5 mg daily . Continue amlodipine  10mg . Discuss amlodipine  side effects and consider dose adjustment if pedal edema persists. Schedule follow-up in one week .

## 2024-05-26 ENCOUNTER — Ambulatory Visit: Payer: Self-pay | Admitting: Family

## 2024-05-26 ENCOUNTER — Encounter: Payer: Self-pay | Admitting: Family

## 2024-05-26 ENCOUNTER — Ambulatory Visit (INDEPENDENT_AMBULATORY_CARE_PROVIDER_SITE_OTHER): Admitting: Family

## 2024-05-26 VITALS — BP 130/82 | HR 84 | Temp 97.6°F | Resp 20 | Ht 63.0 in | Wt 176.1 lb

## 2024-05-26 DIAGNOSIS — I10 Essential (primary) hypertension: Secondary | ICD-10-CM | POA: Diagnosis not present

## 2024-05-26 DIAGNOSIS — R002 Palpitations: Secondary | ICD-10-CM | POA: Diagnosis not present

## 2024-05-26 DIAGNOSIS — E782 Mixed hyperlipidemia: Secondary | ICD-10-CM

## 2024-05-26 LAB — BASIC METABOLIC PANEL WITH GFR
BUN: 10 mg/dL (ref 6–23)
CO2: 30 meq/L (ref 19–32)
Calcium: 8.8 mg/dL (ref 8.4–10.5)
Chloride: 105 meq/L (ref 96–112)
Creatinine, Ser: 0.75 mg/dL (ref 0.40–1.20)
GFR: 77.84 mL/min (ref >60.00)
Glucose, Bld: 113 mg/dL — ABNORMAL HIGH (ref 70–99)
Potassium: 3.5 meq/L (ref 3.5–5.1)
Sodium: 143 meq/L (ref 135–145)

## 2024-05-26 NOTE — Progress Notes (Unsigned)
   Assessment & Plan:  There are no diagnoses linked to this encounter.   Return precautions given.   Risks, benefits, and alternatives of the medications and treatment plan prescribed today were discussed, and patient expressed understanding.   Education regarding symptom management and diagnosis given to patient on AVS either electronically or printed.  No follow-ups on file.  [name], FNP  Subjective:    Patient ID: [name], female    DOB: 24-Mar-1951, 73 y.o.   MRN: [id number]  CC: SAJE GALLOP is a 73 y.o. female who presents today for follow up palpitations.   HPI: Feels well today.  No new complaints.  He is currently wearing ZIO monitor.      Started on losartan  12.5mg . Compliant with amlodipine  10mg    TSH 0.58  A1c 5.8  Allergies: Penicillins Current Outpatient Medications on File Prior to Visit  Medication Sig Dispense Refill   amLODipine  (NORVASC ) 10 MG tablet TAKE 1 TABLET BY MOUTH EVERY DAY 90 tablet 3   Avacincaptad Pegol (IZERVAY) 2 MG/0.1ML SOLN Inject as directed. Every 8 weeks     desonide (DESOWEN) 0.05 % ointment Apply 1 Application topically 2 (two) times daily.     diclofenac (VOLTAREN) 75 MG EC tablet Take 75 mg by mouth 2 (two) times daily.     Golimumab  (SIMPONI  ARIA IV) Infusion every 8 weeks     levothyroxine  (SYNTHROID ) 137 MCG tablet TAKE 1 TABLET BY MOUTH DAILY BEFORE BREAKFAST. 90 tablet 3   losartan  (COZAAR ) 25 MG tablet Take 0.5 tablets (12.5 mg total) by mouth daily. 90 tablet 1   Multiple Vitamins-Minerals (PRESERVISION AREDS 2 PO) Take 1 tablet by mouth daily.     omeprazole  (PRILOSEC) 40 MG capsule TAKE 1 CAPSULE (40 MG TOTAL) BY MOUTH DAILY. 90 capsule 1   sertraline  (ZOLOFT ) 50 MG tablet Take 1 tablet (50 mg total) by mouth daily. 90 tablet 1   rosuvastatin  (CRESTOR ) 5 MG tablet Take 1 tablet (5 mg total) by mouth daily. (Patient not taking: Reported on 05/26/2024)     No current facility-administered  medications on file prior to visit.    Review of Systems  Constitutional:  Negative for chills and fever.  Respiratory:  Negative for cough.   Cardiovascular:  Negative for chest pain and palpitations.  Gastrointestinal:  Negative for nausea and vomiting.      Objective:    BP 130/82   Pulse 84   Temp 97.6 F (36.4 C)   Resp 20   Ht 5' 3 (1.6 m)   Wt 176 lb 2 oz (79.9 kg)   SpO2 98%   BMI 31.20 kg/m  BP Readings from Last 3 Encounters:  05/26/24 130/82  05/21/24 (!) 140/88  07/25/23 135/75   Wt Readings from Last 3 Encounters:  05/26/24 176 lb 2 oz (79.9 kg)  05/21/24 176 lb 9.6 oz (80.1 kg)  01/07/24 174 lb (78.9 kg)    Physical Exam

## 2024-05-27 ENCOUNTER — Ambulatory Visit: Payer: Self-pay | Admitting: Family

## 2024-05-28 ENCOUNTER — Other Ambulatory Visit

## 2024-05-28 MED ORDER — ROSUVASTATIN CALCIUM 5 MG PO TABS: 5.0000 mg | ORAL_TABLET | Freq: Every day | ORAL | 1 refills | Status: AC

## 2024-05-31 NOTE — Assessment & Plan Note (Signed)
 No recurrence.  Currently wearing ZIO monitor.

## 2024-05-31 NOTE — Assessment & Plan Note (Addendum)
 Chronic, improved.  Continue losartan  12.5 mg daily, amlodipine  10 mg daily.  Repeat BMP lab today

## 2024-06-09 ENCOUNTER — Ambulatory Visit: Admitting: Family

## 2024-06-12 DIAGNOSIS — R002 Palpitations: Secondary | ICD-10-CM | POA: Diagnosis not present

## 2024-06-19 DIAGNOSIS — R002 Palpitations: Secondary | ICD-10-CM

## 2024-06-24 ENCOUNTER — Encounter: Payer: Self-pay | Admitting: Family

## 2024-07-18 ENCOUNTER — Telehealth: Admitting: Physician Assistant

## 2024-07-18 DIAGNOSIS — B9689 Other specified bacterial agents as the cause of diseases classified elsewhere: Secondary | ICD-10-CM | POA: Diagnosis not present

## 2024-07-18 DIAGNOSIS — J019 Acute sinusitis, unspecified: Secondary | ICD-10-CM | POA: Diagnosis not present

## 2024-07-18 MED ORDER — FLUTICASONE PROPIONATE 50 MCG/ACT NA SUSP: 2.0000 | Freq: Every day | NASAL | 0 refills | Status: AC

## 2024-07-18 MED ORDER — DOXYCYCLINE HYCLATE 100 MG PO TABS: 100.0000 mg | ORAL_TABLET | Freq: Two times a day (BID) | ORAL | 0 refills | Status: AC

## 2024-07-18 NOTE — Patient Instructions (Signed)
 Jennifer Patrick, thank you for joining Jennifer CHRISTELLA Dickinson, PA-C for today's virtual visit.  While this provider is not your primary care provider (PCP), if your PCP is located in our provider database this encounter information will be shared with them immediately following your visit.   A [facility] MyChart account gives you access to today's visit and all your visits, tests, and labs performed at [facility]  click here if you don't have a [facility] MyChart account or go to [email or web address]  Consent: (Patient) Jennifer Patrick provided verbal consent for this virtual visit at the beginning of the encounter.  Current Medications:  Current Outpatient Medications:    doxycycline (VIBRA-TABS) 100 MG tablet, Take 1 tablet (100 mg total) by mouth 2 (two) times daily., Disp: 14 tablet, Rfl: 0   fluticasone (FLONASE) 50 MCG/ACT nasal spray, Place 2 sprays into both nostrils daily., Disp: 16 g, Rfl: 0   amLODipine  (NORVASC ) 10 MG tablet, TAKE 1 TABLET BY MOUTH EVERY DAY, Disp: 90 tablet, Rfl: 3   Avacincaptad Pegol (IZERVAY) 2 MG/0.1ML SOLN, Inject as directed. Every 8 weeks, Disp: , Rfl:    desonide (DESOWEN) 0.05 % ointment, Apply 1 Application topically 2 (two) times daily., Disp: , Rfl:    diclofenac (VOLTAREN) 75 MG EC tablet, Take 75 mg by mouth 2 (two) times daily., Disp: , Rfl:    Golimumab  (SIMPONI  ARIA IV), Infusion every 8 weeks, Disp: , Rfl:    levothyroxine  (SYNTHROID ) 137 MCG tablet, TAKE 1 TABLET BY MOUTH DAILY BEFORE BREAKFAST., Disp: 90 tablet, Rfl: 3   losartan  (COZAAR ) 25 MG tablet, Take 0.5 tablets (12.5 mg total) by mouth daily., Disp: 90 tablet, Rfl: 1   Multiple Vitamins-Minerals (PRESERVISION AREDS 2 PO), Take 1 tablet by mouth daily., Disp: , Rfl:    omeprazole  (PRILOSEC) 40 MG capsule, TAKE 1 CAPSULE (40 MG TOTAL) BY MOUTH DAILY., Disp: 90 capsule, Rfl: 1   rosuvastatin  (CRESTOR ) 5 MG tablet, Take 1 tablet (5 mg total) by mouth daily., Disp:  90 tablet, Rfl: 1   sertraline  (ZOLOFT ) 50 MG tablet, Take 1 tablet (50 mg total) by mouth daily., Disp: 90 tablet, Rfl: 1   Medications ordered in this encounter:  Meds ordered this encounter  Medications   doxycycline (VIBRA-TABS) 100 MG tablet    Sig: Take 1 tablet (100 mg total) by mouth 2 (two) times daily.    Dispense:  14 tablet    Refill:  0    Supervising Provider:   LAMPTEY, PHILIP O [[id number]]   fluticasone (FLONASE) 50 MCG/ACT nasal spray    Sig: Place 2 sprays into both nostrils daily.    Dispense:  16 g    Refill:  0    Supervising Provider:   BLAISE ALEENE KIDD [[id number]]     *If you need refills on other medications prior to your next appointment, please contact your pharmacy*  Follow-Up: Call back or seek an in-person evaluation if the symptoms worsen or if the condition fails to improve as anticipated.  [facility] [phone]  Other Instructions Sinus Infection, Adult A sinus infection, also called sinusitis, is inflammation of your sinuses. Sinuses are hollow spaces in the bones around your face. Your sinuses are located: Around your eyes. In the middle of your forehead. Behind your nose. In your cheekbones. Mucus normally drains out of your sinuses. When your nasal tissues become inflamed or swollen, mucus can become trapped or blocked. This allows bacteria, viruses, and fungi to grow,  which leads to infection. Most infections of the sinuses are caused by a virus. A sinus infection can develop quickly. It can last for up to 4 weeks (acute) or for more than 12 weeks (chronic). A sinus infection often develops after a cold. What are the causes? This condition is caused by anything that creates swelling in the sinuses or stops mucus from draining. This includes: Allergies. Asthma. Infection from bacteria or viruses. Deformities or blockages in your nose or sinuses. Abnormal growths in the nose (nasal polyps). Pollutants, such as chemicals or  irritants in the air. Infection from fungi. This is rare. What increases the risk? You are more likely to develop this condition if you: Have a weak body defense system (immune system). Do a lot of swimming or diving. Overuse nasal sprays. Smoke. What are the signs or symptoms? The main symptoms of this condition are pain and a feeling of pressure around the affected sinuses. Other symptoms include: Stuffy nose or congestion that makes it difficult to breathe through your nose. Thick yellow or greenish drainage from your nose. Tenderness, swelling, and warmth over the affected sinuses. A cough that may get worse at night. Decreased sense of smell and taste. Extra mucus that collects in the throat or the back of the nose (postnasal drip) causing a sore throat or bad breath. Tiredness (fatigue). Fever. How is this diagnosed? This condition is diagnosed based on: Your symptoms. Your medical history. A physical exam. Tests to find out if your condition is acute or chronic. This may include: Checking your nose for nasal polyps. Viewing your sinuses using a device that has a light (endoscope). Testing for allergies or bacteria. Imaging tests, such as an MRI or CT scan. In rare cases, a bone biopsy may be done to rule out more serious types of fungal sinus disease. How is this treated? Treatment for a sinus infection depends on the cause and whether your condition is chronic or acute. If caused by a virus, your symptoms should go away on their own within 10 days. You may be given medicines to relieve symptoms. They include: Medicines that shrink swollen nasal passages (decongestants). A spray that eases inflammation of the nostrils (topical intranasal corticosteroids). Rinses that help get rid of thick mucus in your nose (nasal saline washes). Medicines that treat allergies (antihistamines). Over-the-counter pain relievers. If caused by bacteria, your health care provider may recommend  waiting to see if your symptoms improve. Most bacterial infections will get better without antibiotic medicine. You may be given antibiotics if you have: A severe infection. A weak immune system. If caused by narrow nasal passages or nasal polyps, surgery may be needed. Follow these instructions at home: Medicines Take, use, or apply over-the-counter and prescription medicines only as told by your health care provider. These may include nasal sprays. If you were prescribed an antibiotic medicine, take it as told by your health care provider. Do not stop taking the antibiotic even if you start to feel better. Hydrate and humidify  Drink enough fluid to keep your urine pale yellow. Staying hydrated will help to thin your mucus. Use a cool mist humidifier to keep the humidity level in your home above 50%. Inhale steam for 10-15 minutes, 3-4 times a day, or as told by your health care provider. You can do this in the bathroom while a hot shower is running. Limit your exposure to cool or dry air. Rest Rest as much as possible. Sleep with your head raised (elevated). Make sure  you get enough sleep each night. General instructions  Apply a warm, moist washcloth to your face 3-4 times a day or as told by your health care provider. This will help with discomfort. Use nasal saline washes as often as told by your health care provider. Wash your hands often with soap and water  to reduce your exposure to germs. If soap and water  are not available, use hand sanitizer. Do not smoke. Avoid being around people who are smoking (secondhand smoke). Keep all follow-up visits. This is important. Contact a health care provider if: You have a fever. Your symptoms get worse. Your symptoms do not improve within 10 days. Get help right away if: You have a severe headache. You have persistent vomiting. You have severe pain or swelling around your face or eyes. You have vision problems. You develop  confusion. Your neck is stiff. You have trouble breathing. These symptoms may be an emergency. Get help right away. Call 911. Do not wait to see if the symptoms will go away. Do not drive yourself to the hospital. Summary A sinus infection is soreness and inflammation of your sinuses. Sinuses are hollow spaces in the bones around your face. This condition is caused by nasal tissues that become inflamed or swollen. The swelling traps or blocks the flow of mucus. This allows bacteria, viruses, and fungi to grow, which leads to infection. If you were prescribed an antibiotic medicine, take it as told by your health care provider. Do not stop taking the antibiotic even if you start to feel better. Keep all follow-up visits. This is important. This information is not intended to replace advice given to you by your health care provider. Make sure you discuss any questions you have with your health care provider. Document Revised: 08/09/2021 Document Reviewed: 08/09/2021 Elsevier Patient Education  2024 [employer]   If you have been instructed to have an in-person evaluation today at a local Urgent Care facility, please use the link below. It will take you to a list of all of our available [facility], including address, phone number and hours of operation. Please do not delay care.  [facility]  If you or a family member do not have a primary care provider, use the link below to schedule a visit and establish care. When you choose a [facility] primary care physician or advanced practice provider, you gain a long-term partner in health. Find a Primary Care Provider  Learn more about [facility]'s in-office and virtual care options: [facility] Get Care Now

## 2024-07-18 NOTE — Progress Notes (Signed)
 Virtual Visit Consent   Jennifer Patrick, you are scheduled for a virtual visit with a [facility] provider today. Just as with appointments in the office, your consent must be obtained to participate. Your consent will be active for this visit and any virtual visit you may have with one of our providers in the next 365 days. If you have a MyChart account, a copy of this consent can be sent to you electronically.  As this is a virtual visit, video technology does not allow for your provider to perform a traditional examination. This may limit your provider's ability to fully assess your condition. If your provider identifies any concerns that need to be evaluated in person or the need to arrange testing (such as labs, EKG, etc.), we will make arrangements to do so. Although advances in technology are sophisticated, we cannot ensure that it will always work on either your end or our end. If the connection with a video visit is poor, the visit may have to be switched to a telephone visit. With either a video or telephone visit, we are not always able to ensure that we have a secure connection.  By engaging in this virtual visit, you consent to the provision of healthcare and authorize for your insurance to be billed (if applicable) for the services provided during this visit. Depending on your insurance coverage, you may receive a charge related to this service.  I need to obtain your verbal consent now. Are you willing to proceed with your visit today? Jennifer Patrick has provided verbal consent on 07/18/2024 for a virtual visit (video or telephone). Delon CHRISTELLA Dickinson, PA-C  Date: 07/18/2024 8:57 AM   Virtual Visit via Video Note   I, Jennifer Patrick, connected with  Jennifer Patrick  ([id number], [id number]) on 07/18/24 at  8:15 AM EDT by a video-enabled telemedicine application and verified that I am speaking with the correct person using two identifiers.  Location: Patient: Virtual  Visit Location Patient: Home Provider: Virtual Visit Location Provider: Home Office   I discussed the limitations of evaluation and management by telemedicine and the availability of in person appointments. The patient expressed understanding and agreed to proceed.    History of Present Illness: Jennifer Patrick is a 73 y.o. who identifies as a female who was assigned female at birth, and is being seen today for sinus congestion.  HPI: URI  This is a new problem. The current episode started yesterday. Maximum temperature: possible low grade; had some chills, and sweats overnight. The fever has been present for Less than 1 day. Associated symptoms include congestion, coughing (mild, occasionally productive of yellow, thick mucus), ear pain (aching bilateral), headaches, nausea, rhinorrhea (and post nasal drainage), sinus pain (face hurts and teeth hurt) and a sore throat (congested throat and scracthy). Pertinent negatives include no chest pain, diarrhea, plugged ear sensation, vomiting or wheezing. She has tried acetaminophen  and NSAIDs (tylenol , ibuprofen) for the symptoms. The treatment provided no relief.     Problems:  Patient Active Problem List   Diagnosis Date Noted   Palpitations 05/21/2024   Hematuria 01/23/2023   Low back pain 01/23/2023   Left shoulder pain 01/22/2023   Dyspnea on exertion 07/19/2022   Coronary artery calcification 07/19/2022   Aortic atherosclerosis 07/19/2022   Paresthesias 07/19/2022   Obstructive sleep apnea 07/19/2022   Daytime somnolence 07/19/2022   Great toe pain, right 04/26/2022   Mixed hyperlipidemia 04/26/2022   Screen for colon cancer  Epigastric pain 12/20/2020   Icterus 12/20/2020   Gall bladder disease 08/27/2020   Right leg swelling 01/19/2020   Right groin pain 01/19/2020   Drusen (degenerative) of macula, bilateral 02/07/2018   Pleural effusion 10/25/2016   Elevated liver enzymes 10/25/2016   Decreased renal function 10/25/2016    Rheumatoid arthritis (HCC) 07/19/2016   GERD (gastroesophageal reflux disease) 07/19/2016   Encounter to establish care 01/17/2016   Macular pattern dystrophy 12/16/2015   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   Pseudophakia of both eyes 12/16/2015   Routine physical examination 12/16/2014   Screening for colon cancer 06/15/2014   Obesity (BMI 30-39.9) 06/06/2013   Hypothyroidism 12/05/2012   Psoriasis 12/05/2012   Hypertension 07/03/2011   Depression 07/03/2011    Allergies:  Allergies  Allergen Reactions   Penicillins     rash   Medications:  Current Outpatient Medications:    doxycycline (VIBRA-TABS) 100 MG tablet, Take 1 tablet (100 mg total) by mouth 2 (two) times daily., Disp: 14 tablet, Rfl: 0   fluticasone (FLONASE) 50 MCG/ACT nasal spray, Place 2 sprays into both nostrils daily., Disp: 16 g, Rfl: 0   amLODipine  (NORVASC ) 10 MG tablet, TAKE 1 TABLET BY MOUTH EVERY DAY, Disp: 90 tablet, Rfl: 3   Avacincaptad Pegol (IZERVAY) 2 MG/0.1ML SOLN, Inject as directed. Every 8 weeks, Disp: , Rfl:    desonide (DESOWEN) 0.05 % ointment, Apply 1 Application topically 2 (two) times daily., Disp: , Rfl:    diclofenac (VOLTAREN) 75 MG EC tablet, Take 75 mg by mouth 2 (two) times daily., Disp: , Rfl:    Golimumab  (SIMPONI  ARIA IV), Infusion every 8 weeks, Disp: , Rfl:    levothyroxine  (SYNTHROID ) 137 MCG tablet, TAKE 1 TABLET BY MOUTH DAILY BEFORE BREAKFAST., Disp: 90 tablet, Rfl: 3   losartan  (COZAAR ) 25 MG tablet, Take 0.5 tablets (12.5 mg total) by mouth daily., Disp: 90 tablet, Rfl: 1   Multiple Vitamins-Minerals (PRESERVISION AREDS 2 PO), Take 1 tablet by mouth daily., Disp: , Rfl:    omeprazole  (PRILOSEC) 40 MG capsule, TAKE 1 CAPSULE (40 MG TOTAL) BY MOUTH DAILY., Disp: 90 capsule, Rfl: 1   rosuvastatin  (CRESTOR ) 5 MG tablet, Take 1 tablet (5 mg total) by mouth daily., Disp: 90 tablet, Rfl: 1   sertraline  (ZOLOFT ) 50 MG tablet, Take 1 tablet (50 mg total) by mouth daily.,  Disp: 90 tablet, Rfl: 1  Observations/Objective: Patient is well-developed, well-nourished in no acute distress.  Resting comfortably at home.  Head is normocephalic, atraumatic.  No labored breathing.  Speech is clear and coherent with logical content.  Patient is alert and oriented at baseline.    Assessment and Plan: 1. Acute bacterial sinusitis (Primary) - doxycycline (VIBRA-TABS) 100 MG tablet; Take 1 tablet (100 mg total) by mouth 2 (two) times daily.  Dispense: 14 tablet; Refill: 0 - fluticasone (FLONASE) 50 MCG/ACT nasal spray; Place 2 sprays into both nostrils daily.  Dispense: 16 g; Refill: 0  - Worsening symptoms that have not responded to OTC medications.  - Will give Doxycycline and Flonase - Continue allergy medications.  - Steam and humidifier can help - Stay well hydrated and get plenty of rest.  - Seek in person evaluation if no symptom improvement or if symptoms worsen   Follow Up Instructions: I discussed the assessment and treatment plan with the patient. The patient was provided an opportunity to ask questions and all were answered. The patient agreed with the plan and demonstrated an understanding of the instructions.  A copy of instructions were sent to the patient via MyChart unless otherwise noted below.    The patient was advised to call back or seek an in-person evaluation if the symptoms worsen or if the condition fails to improve as anticipated.    Delon CHRISTELLA Dickinson, PA-C

## 2024-07-21 ENCOUNTER — Ambulatory Visit: Admitting: Family

## 2024-07-28 ENCOUNTER — Other Ambulatory Visit: Payer: Self-pay

## 2024-07-28 DIAGNOSIS — N200 Calculus of kidney: Secondary | ICD-10-CM

## 2024-07-28 DIAGNOSIS — H353134 Nonexudative age-related macular degeneration, bilateral, advanced atrophic with subfoveal involvement: Secondary | ICD-10-CM | POA: Diagnosis not present

## 2024-07-30 ENCOUNTER — Ambulatory Visit: Payer: Self-pay | Admitting: Physician Assistant

## 2024-07-30 DIAGNOSIS — N200 Calculus of kidney: Secondary | ICD-10-CM

## 2024-08-19 DIAGNOSIS — L4050 Arthropathic psoriasis, unspecified: Secondary | ICD-10-CM | POA: Diagnosis not present

## 2024-08-19 DIAGNOSIS — Z79899 Other long term (current) drug therapy: Secondary | ICD-10-CM | POA: Diagnosis not present

## 2024-10-01 LAB — OPHTHALMOLOGY REPORT-SCANNED

## 2025-01-12 ENCOUNTER — Ambulatory Visit
